# Patient Record
Sex: Male | Born: 1950
Health system: Southern US, Community
[De-identification: ages and names within clinical notes are randomized; demographics above are authoritative.]

## PROBLEM LIST (undated history)

## (undated) DIAGNOSIS — N529 Male erectile dysfunction, unspecified: Secondary | ICD-10-CM

## (undated) DIAGNOSIS — R319 Hematuria, unspecified: Secondary | ICD-10-CM

## (undated) DIAGNOSIS — I639 Cerebral infarction, unspecified: Secondary | ICD-10-CM

## (undated) DIAGNOSIS — I10 Essential (primary) hypertension: Secondary | ICD-10-CM

## (undated) DIAGNOSIS — E785 Hyperlipidemia, unspecified: Secondary | ICD-10-CM

## (undated) HISTORY — DX: Cerebral infarction, unspecified: I63.9

## (undated) HISTORY — DX: Hyperlipidemia, unspecified: E78.5

## (undated) HISTORY — PX: OTHER SURGICAL HISTORY: SHX169

## (undated) HISTORY — DX: Hematuria, unspecified: R31.9

## (undated) HISTORY — DX: Essential (primary) hypertension: I10

## (undated) HISTORY — DX: Male erectile dysfunction, unspecified: N52.9

## (undated) HISTORY — PX: EYE SURGERY: SHX253

---
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
## 2003-04-04 DIAGNOSIS — I639 Cerebral infarction, unspecified: Secondary | ICD-10-CM

## 2003-04-04 HISTORY — DX: Cerebral infarction, unspecified: I63.9

## 2010-06-02 LAB — HM COLONOSCOPY

## 2014-05-24 DIAGNOSIS — Z8673 Personal history of transient ischemic attack (TIA), and cerebral infarction without residual deficits: Secondary | ICD-10-CM | POA: Diagnosis not present

## 2014-05-24 DIAGNOSIS — E785 Hyperlipidemia, unspecified: Secondary | ICD-10-CM | POA: Diagnosis not present

## 2014-05-24 DIAGNOSIS — Z6821 Body mass index (BMI) 21.0-21.9, adult: Secondary | ICD-10-CM | POA: Diagnosis not present

## 2014-05-24 DIAGNOSIS — I10 Essential (primary) hypertension: Secondary | ICD-10-CM | POA: Diagnosis not present

## 2014-05-24 DIAGNOSIS — F17200 Nicotine dependence, unspecified, uncomplicated: Secondary | ICD-10-CM | POA: Diagnosis not present

## 2014-08-12 DIAGNOSIS — Z720 Tobacco use: Secondary | ICD-10-CM | POA: Diagnosis not present

## 2014-08-12 DIAGNOSIS — I10 Essential (primary) hypertension: Secondary | ICD-10-CM | POA: Diagnosis not present

## 2014-08-12 DIAGNOSIS — E785 Hyperlipidemia, unspecified: Secondary | ICD-10-CM | POA: Diagnosis not present

## 2015-01-18 DIAGNOSIS — I10 Essential (primary) hypertension: Secondary | ICD-10-CM

## 2015-01-18 DIAGNOSIS — E785 Hyperlipidemia, unspecified: Secondary | ICD-10-CM | POA: Insufficient documentation

## 2015-01-18 DIAGNOSIS — L259 Unspecified contact dermatitis, unspecified cause: Secondary | ICD-10-CM | POA: Insufficient documentation

## 2015-01-18 DIAGNOSIS — I129 Hypertensive chronic kidney disease with stage 1 through stage 4 chronic kidney disease, or unspecified chronic kidney disease: Secondary | ICD-10-CM | POA: Insufficient documentation

## 2015-01-19 ENCOUNTER — Encounter: Payer: Self-pay | Admitting: Unknown Physician Specialty

## 2015-01-19 ENCOUNTER — Ambulatory Visit (INDEPENDENT_AMBULATORY_CARE_PROVIDER_SITE_OTHER): Payer: Commercial Managed Care - HMO | Admitting: Unknown Physician Specialty

## 2015-01-19 ENCOUNTER — Other Ambulatory Visit: Payer: Self-pay | Admitting: Unknown Physician Specialty

## 2015-01-19 ENCOUNTER — Telehealth: Payer: Self-pay | Admitting: Unknown Physician Specialty

## 2015-01-19 VITALS — BP 146/81 | HR 118 | Temp 97.9°F | Ht 71.4 in | Wt 148.8 lb

## 2015-01-19 DIAGNOSIS — G459 Transient cerebral ischemic attack, unspecified: Secondary | ICD-10-CM | POA: Diagnosis not present

## 2015-01-19 DIAGNOSIS — N183 Chronic kidney disease, stage 3 unspecified: Secondary | ICD-10-CM | POA: Insufficient documentation

## 2015-01-19 DIAGNOSIS — R0989 Other specified symptoms and signs involving the circulatory and respiratory systems: Secondary | ICD-10-CM

## 2015-01-19 DIAGNOSIS — J449 Chronic obstructive pulmonary disease, unspecified: Secondary | ICD-10-CM | POA: Insufficient documentation

## 2015-01-19 DIAGNOSIS — I10 Essential (primary) hypertension: Secondary | ICD-10-CM | POA: Diagnosis not present

## 2015-01-19 DIAGNOSIS — Z720 Tobacco use: Secondary | ICD-10-CM

## 2015-01-19 DIAGNOSIS — N182 Chronic kidney disease, stage 2 (mild): Secondary | ICD-10-CM | POA: Insufficient documentation

## 2015-01-19 DIAGNOSIS — Z23 Encounter for immunization: Secondary | ICD-10-CM | POA: Diagnosis not present

## 2015-01-19 DIAGNOSIS — R0602 Shortness of breath: Secondary | ICD-10-CM

## 2015-01-19 DIAGNOSIS — G4730 Sleep apnea, unspecified: Secondary | ICD-10-CM

## 2015-01-19 DIAGNOSIS — E785 Hyperlipidemia, unspecified: Secondary | ICD-10-CM

## 2015-01-19 LAB — MICROALBUMIN, URINE WAIVED
CREATININE, URINE WAIVED: 200 mg/dL (ref 10–300)
MICROALB, UR WAIVED: 80 mg/L — AB (ref 0–19)

## 2015-01-19 LAB — LIPID PANEL PICCOLO, WAIVED
CHOL/HDL RATIO PICCOLO,WAIVE: 3.2 mg/dL
Cholesterol Piccolo, Waived: 138 mg/dL (ref <200)
HDL Chol Piccolo, Waived: 43 mg/dL — ABNORMAL LOW (ref >59)
LDL Chol Calc Piccolo Waived: 71 mg/dL (ref <100)
Triglycerides Piccolo,Waived: 117 mg/dL (ref <150)
VLDL CHOL CALC PICCOLO,WAIVE: 23 mg/dL (ref <30)

## 2015-01-19 MED ORDER — LISINOPRIL-HYDROCHLOROTHIAZIDE 20-25 MG PO TABS: 1.0000 | ORAL_TABLET | Freq: Every day | ORAL | Status: DC

## 2015-01-19 MED ORDER — ATORVASTATIN CALCIUM 20 MG PO TABS: 20.0000 mg | ORAL_TABLET | Freq: Every day | ORAL | Status: DC

## 2015-01-19 MED ORDER — AMLODIPINE BESYLATE 5 MG PO TABS: 5.0000 mg | ORAL_TABLET | Freq: Every day | ORAL | Status: DC

## 2015-01-19 NOTE — Progress Notes (Addendum)
+-+  BP 146/81 mmHg  Pulse 118  Temp(Src) 97.9 F (36.6 C)  Ht 5' 11.4" (1.814 m)  Wt 148 lb 12.8 oz (67.495 kg)  BMI 20.51 kg/m2  SpO2 97%   Subjective:    Patient ID: Aaron Boyd, male    DOB: 1951/03/31, 64 y.o.   MRN: [id number]  HPI: Aaron Boyd is a 64 y.o. male  Chief Complaint  Patient presents with  . Hyperlipidemia  . Hypertension   Hyperlipidemia This is a chronic problem. The problem is controlled. Recent lipid tests were reviewed and are normal. Associated symptoms include shortness of breath. Pertinent negatives include no chest pain. Current antihyperlipidemic treatment includes statins. The current treatment provides significant improvement of lipids. There are no compliance problems.   Hypertension This is a chronic problem. The problem is uncontrolled. Associated symptoms include blurred vision and shortness of breath. Pertinent negatives include no anxiety, chest pain, headaches, malaise/fatigue, neck pain, orthopnea, palpitations, peripheral edema, PND or sweats. Risk factors for coronary artery disease include smoking/tobacco exposure and dyslipidemia. There are no compliance problems.  Identifiable causes of hypertension include sleep apnea.   Snoring Uses "breath right" nasal strips.  Takes a nap at times and falls asleep in the evening.  States he has no energy much of the time.  He can work for 10-15 minutes and then has to stop due to fatigue and SOB    Relevant past medical, surgical, family and social history reviewed and updated as indicated. Interim medical history since our last visit reviewed. Allergies and medications reviewed and updated.  Family and personal history significant for Hypertension and Hyperlipidemia.  Family history significant for heart disease  Labs from previous chart reveiwed.    Review of Systems  Constitutional: Negative for malaise/fatigue.  Eyes: Positive for blurred vision.  Respiratory: Positive for shortness of  breath.        Continues to smoke 1/2 ppd  Cardiovascular: Negative for chest pain, palpitations, orthopnea and PND.  Musculoskeletal: Negative for neck pain.  Neurological: Negative for headaches.       Pt states that 1-2 weeks ago felt numb on his whole left side for about 10-15 minutes.  Symptoms resolved.    Per HPI unless specifically indicated above     Objective:    BP 146/81 mmHg  Pulse 118  Temp(Src) 97.9 F (36.6 C)  Ht 5' 11.4" (1.814 m)  Wt 148 lb 12.8 oz (67.495 kg)  BMI 20.51 kg/m2  SpO2 97%  Wt Readings from Last 3 Encounters:  01/19/15 148 lb 12.8 oz (67.495 kg)  08/12/14 155 lb (70.308 kg)    Physical Exam  Constitutional: He is oriented to person, place, and time. He appears well-developed and well-nourished. No distress.  HENT:  Head: Normocephalic and atraumatic.  Eyes: Conjunctivae and lids are normal. Right eye exhibits no discharge. Left eye exhibits no discharge. No scleral icterus.  Neck: Decreased carotid pulses present. Carotid bruit is present.  Cardiovascular: Normal rate and regular rhythm.   Pulmonary/Chest: Effort normal. No respiratory distress.  Breath sounds diminished  Abdominal: Normal appearance and bowel sounds are normal. He exhibits no distension. There is no splenomegaly or hepatomegaly. There is no tenderness.  Musculoskeletal: Normal range of motion.  Neurological: He is alert and oriented to person, place, and time.  Skin: Skin is intact. No rash noted. No pallor.  Psychiatric: He has a normal mood and affect. His behavior is normal. Judgment and thought content normal.  Nursing note and  vitals reviewed.  Spirometry shows mild obstruction    Assessment & Plan:   Problem List Items Addressed This Visit      Unprioritized   Hypertension    Not to goal.  Will add Amlodipine 5 mg.        Relevant Medications   atorvastatin (LIPITOR) 20 MG tablet   lisinopril-hydrochlorothiazide (PRINZIDE,ZESTORETIC) 20-25 MG tablet    amLODipine (NORVASC) 5 MG tablet   Other Relevant Orders   Microalbumin, Urine Waived   Uric acid   Comprehensive metabolic panel   Hyperlipidemia    LDL stable at 70.  Continue present meds.        Relevant Medications   atorvastatin (LIPITOR) 20 MG tablet   lisinopril-hydrochlorothiazide (PRINZIDE,ZESTORETIC) 20-25 MG tablet   amLODipine (NORVASC) 5 MG tablet   Other Relevant Orders   Lipid Panel Piccolo, Waived   Tobacco use   COPD, mild (HCC)    Encouraged to quit smoking      CKD (chronic kidney disease) stage 2, GFR 60-89 ml/min    Other Visit Diagnoses    Immunization due    -  Primary    Relevant Orders    Flu Vaccine QUAD 36+ mos PF IM (Fluarix & Fluzone Quad PF) (Completed)    SOB (shortness of breath)        Relevant Orders    Spirometry with Graph (Completed)    Ambulatory referral to Cardiology    CBC    TSH    Transient cerebral ischemia, unspecified transient cerebral ischemia type        Suspect TIA.  Discussed take a daily ASA.  Refer to cardiology for US and carotid US.  Bilateral carotic bruits.  Call 911 further TIA symptoms    Relevant Medications    atorvastatin (LIPITOR) 20 MG tablet    lisinopril-hydrochlorothiazide (PRINZIDE,ZESTORETIC) 20-25 MG tablet    amLODipine (NORVASC) 5 MG tablet    Other Relevant Orders    US Carotid Duplex Bilateral    CBC    MR MRA Head/Brain Wo Cm    Sleep apnea        Refer for sleep study with snoring becoming a problem    Relevant Orders    Ambulatory referral to Sleep Studies    Bilateral carotid bruits        Relevant Orders    US Carotid Duplex Bilateral    MR MRA Head/Brain Wo Cm        Follow up plan: Return in about 4 weeks (around 02/16/2015).   Discussed pt with Dr. Laural BenesJohnson

## 2015-01-19 NOTE — Assessment & Plan Note (Signed)
Not to goal.  Will add Amlodipine 5 mg.

## 2015-01-19 NOTE — Assessment & Plan Note (Signed)
LDL stable at 70.  Continue present meds.

## 2015-01-19 NOTE — Patient Instructions (Addendum)
Watch for appointments with cardiology, carotid US, MRI head Start ASA 325 mg Start Amlodipine 5 mg

## 2015-01-19 NOTE — Telephone Encounter (Signed)
Called to make certain that we had the orders correct for the MRA brain without contrast, spoke with Dr Mosetta PuttGrady, radiologist and he stated that MRA brain without contrast with solely look at the arteries within the brain, however in addition you could order a MRI brain without contrast to view the entire brain.   However even though you are ordering these exams insurance will ultimately have the final decision on what they will cover they may suggest a different study. I am nto certain until I get the authorization. I will keep you posted on which happens for approval.

## 2015-01-19 NOTE — Assessment & Plan Note (Signed)
Encouraged to quit smoking.  

## 2015-01-20 ENCOUNTER — Other Ambulatory Visit: Payer: Self-pay | Admitting: Unknown Physician Specialty

## 2015-01-20 DIAGNOSIS — D649 Anemia, unspecified: Secondary | ICD-10-CM

## 2015-01-20 DIAGNOSIS — G4730 Sleep apnea, unspecified: Secondary | ICD-10-CM

## 2015-01-20 LAB — COMPREHENSIVE METABOLIC PANEL
A/G RATIO: 1.4 (ref 1.1–2.5)
ALT: 13 IU/L (ref 0–44)
AST: 15 IU/L (ref 0–40)
Albumin: 3.9 g/dL (ref 3.6–4.8)
Alkaline Phosphatase: 81 IU/L (ref 39–117)
BUN/Creatinine Ratio: 17 (ref 10–22)
BUN: 20 mg/dL (ref 8–27)
Bilirubin Total: 0.6 mg/dL (ref 0.0–1.2)
CALCIUM: 8.9 mg/dL (ref 8.6–10.2)
CO2: 23 mmol/L (ref 18–29)
Chloride: 99 mmol/L (ref 97–106)
Creatinine, Ser: 1.17 mg/dL (ref 0.76–1.27)
GFR, EST AFRICAN AMERICAN: 76 mL/min/{1.73_m2} (ref >59)
GFR, EST NON AFRICAN AMERICAN: 66 mL/min/{1.73_m2} (ref >59)
GLUCOSE: 88 mg/dL (ref 65–99)
Globulin, Total: 2.7 g/dL (ref 1.5–4.5)
POTASSIUM: 3.8 mmol/L (ref 3.5–5.2)
SODIUM: 136 mmol/L (ref 136–144)
Total Protein: 6.6 g/dL (ref 6.0–8.5)

## 2015-01-20 LAB — CBC
Hematocrit: 33.7 % — ABNORMAL LOW (ref 37.5–51.0)
Hemoglobin: 11.4 g/dL — ABNORMAL LOW (ref 12.6–17.7)
MCH: 29.5 pg (ref 26.6–33.0)
MCHC: 33.8 g/dL (ref 31.5–35.7)
MCV: 87 fL (ref 79–97)
PLATELETS: 221 10*3/uL (ref 150–379)
RBC: 3.86 x10E6/uL — ABNORMAL LOW (ref 4.14–5.80)
RDW: 13.8 % (ref 12.3–15.4)
WBC: 9.6 10*3/uL (ref 3.4–10.8)

## 2015-01-20 LAB — TSH: TSH: 1.48 u[IU]/mL (ref 0.450–4.500)

## 2015-01-20 LAB — URIC ACID: URIC ACID: 6.5 mg/dL (ref 3.7–8.6)

## 2015-01-21 ENCOUNTER — Other Ambulatory Visit: Payer: Commercial Managed Care - HMO

## 2015-01-21 DIAGNOSIS — D649 Anemia, unspecified: Secondary | ICD-10-CM | POA: Diagnosis not present

## 2015-01-22 LAB — FERRITIN: Ferritin: 234 ng/mL (ref 30–400)

## 2015-01-22 LAB — IRON AND TIBC
Iron Saturation: 16 % (ref 15–55)
Iron: 39 ug/dL (ref 38–169)
TIBC: 248 ug/dL — AB (ref 250–450)
UIBC: 209 ug/dL (ref 111–343)

## 2015-01-22 LAB — FOLATE: Folate: 4.7 ng/mL (ref >3.0)

## 2015-01-22 LAB — VITAMIN B12: Vitamin B-12: 380 pg/mL (ref 211–946)

## 2015-01-27 ENCOUNTER — Telehealth: Payer: Self-pay | Admitting: Unknown Physician Specialty

## 2015-01-27 NOTE — Telephone Encounter (Signed)
Feeling Aaron Boyd has notified the pt to scheduled for study that you have requested and pt has refused the study. Scanned documents under media inside the chart.

## 2015-02-17 ENCOUNTER — Ambulatory Visit: Payer: Commercial Managed Care - HMO | Admitting: Unknown Physician Specialty

## 2015-03-08 ENCOUNTER — Telehealth: Payer: Self-pay | Admitting: Unknown Physician Specialty

## 2015-03-08 ENCOUNTER — Ambulatory Visit
Admission: RE | Admit: 2015-03-08 | Discharge: 2015-03-08 | Disposition: A | Discharge status: Home / Self Care | Payer: Commercial Managed Care - HMO | Source: Ambulatory Visit | Attending: Unknown Physician Specialty | Admitting: Unknown Physician Specialty

## 2015-03-08 ENCOUNTER — Other Ambulatory Visit: Payer: Self-pay | Admitting: Unknown Physician Specialty

## 2015-03-08 DIAGNOSIS — R0989 Other specified symptoms and signs involving the circulatory and respiratory systems: Secondary | ICD-10-CM | POA: Diagnosis not present

## 2015-03-08 DIAGNOSIS — M4802 Spinal stenosis, cervical region: Secondary | ICD-10-CM | POA: Insufficient documentation

## 2015-03-08 DIAGNOSIS — I6523 Occlusion and stenosis of bilateral carotid arteries: Secondary | ICD-10-CM | POA: Diagnosis not present

## 2015-03-08 DIAGNOSIS — G459 Transient cerebral ischemic attack, unspecified: Secondary | ICD-10-CM

## 2015-03-08 DIAGNOSIS — I6502 Occlusion and stenosis of left vertebral artery: Secondary | ICD-10-CM | POA: Diagnosis not present

## 2015-03-08 DIAGNOSIS — R42 Dizziness and giddiness: Secondary | ICD-10-CM | POA: Diagnosis not present

## 2015-03-08 DIAGNOSIS — I6522 Occlusion and stenosis of left carotid artery: Secondary | ICD-10-CM

## 2015-03-08 NOTE — Telephone Encounter (Signed)
Called patient to discuss Carotid artery stenosis.  I referred the patient ASAP to vascular.  Pt states he can't afford to go.  I emphasized that this was an important referral and I recommend both cardiology and vascular surgery.  I suspect vascular will need a cardiology evaluation.

## 2015-03-09 DIAGNOSIS — I10 Essential (primary) hypertension: Secondary | ICD-10-CM | POA: Diagnosis not present

## 2015-03-09 DIAGNOSIS — R0602 Shortness of breath: Secondary | ICD-10-CM | POA: Diagnosis not present

## 2015-03-09 DIAGNOSIS — R0789 Other chest pain: Secondary | ICD-10-CM | POA: Diagnosis not present

## 2015-03-09 DIAGNOSIS — Z720 Tobacco use: Secondary | ICD-10-CM | POA: Diagnosis not present

## 2015-03-09 DIAGNOSIS — J449 Chronic obstructive pulmonary disease, unspecified: Secondary | ICD-10-CM | POA: Diagnosis not present

## 2015-03-09 DIAGNOSIS — I779 Disorder of arteries and arterioles, unspecified: Secondary | ICD-10-CM | POA: Insufficient documentation

## 2015-03-09 DIAGNOSIS — E7801 Familial hypercholesterolemia: Secondary | ICD-10-CM | POA: Diagnosis not present

## 2015-03-09 DIAGNOSIS — N182 Chronic kidney disease, stage 2 (mild): Secondary | ICD-10-CM | POA: Diagnosis not present

## 2015-03-23 DIAGNOSIS — R0602 Shortness of breath: Secondary | ICD-10-CM | POA: Diagnosis not present

## 2015-03-23 DIAGNOSIS — R0789 Other chest pain: Secondary | ICD-10-CM | POA: Diagnosis not present

## 2015-04-07 DIAGNOSIS — N182 Chronic kidney disease, stage 2 (mild): Secondary | ICD-10-CM | POA: Diagnosis not present

## 2015-04-07 DIAGNOSIS — E7801 Familial hypercholesterolemia: Secondary | ICD-10-CM | POA: Diagnosis not present

## 2015-04-07 DIAGNOSIS — J449 Chronic obstructive pulmonary disease, unspecified: Secondary | ICD-10-CM | POA: Diagnosis not present

## 2015-04-07 DIAGNOSIS — I10 Essential (primary) hypertension: Secondary | ICD-10-CM | POA: Diagnosis not present

## 2015-04-07 DIAGNOSIS — Z720 Tobacco use: Secondary | ICD-10-CM | POA: Diagnosis not present

## 2015-04-07 DIAGNOSIS — I779 Disorder of arteries and arterioles, unspecified: Secondary | ICD-10-CM | POA: Diagnosis not present

## 2015-04-09 DIAGNOSIS — E785 Hyperlipidemia, unspecified: Secondary | ICD-10-CM | POA: Diagnosis not present

## 2015-04-09 DIAGNOSIS — I6523 Occlusion and stenosis of bilateral carotid arteries: Secondary | ICD-10-CM | POA: Diagnosis not present

## 2015-04-09 DIAGNOSIS — I10 Essential (primary) hypertension: Secondary | ICD-10-CM | POA: Diagnosis not present

## 2015-04-09 DIAGNOSIS — F17200 Nicotine dependence, unspecified, uncomplicated: Secondary | ICD-10-CM | POA: Diagnosis not present

## 2015-05-21 ENCOUNTER — Ambulatory Visit (INDEPENDENT_AMBULATORY_CARE_PROVIDER_SITE_OTHER): Payer: Commercial Managed Care - HMO | Admitting: Unknown Physician Specialty

## 2015-05-21 ENCOUNTER — Encounter: Payer: Self-pay | Admitting: Unknown Physician Specialty

## 2015-05-21 VITALS — BP 100/64 | HR 111 | Temp 97.9°F | Ht 71.7 in | Wt 142.6 lb

## 2015-05-21 DIAGNOSIS — J209 Acute bronchitis, unspecified: Secondary | ICD-10-CM | POA: Diagnosis not present

## 2015-05-21 DIAGNOSIS — J42 Unspecified chronic bronchitis: Secondary | ICD-10-CM | POA: Diagnosis not present

## 2015-05-21 MED ORDER — DOXYCYCLINE HYCLATE 100 MG PO TABS: 100.0000 mg | ORAL_TABLET | Freq: Two times a day (BID) | ORAL | Status: DC

## 2015-05-21 NOTE — Progress Notes (Signed)
BP 100/64 mmHg  Pulse 111  Temp(Src) 97.9 F (36.6 C)  Ht 5' 11.7" (1.821 m)  Wt 142 lb 9.6 oz (64.683 kg)  BMI 19.51 kg/m2  SpO2 97%   Subjective:    Patient ID: Aaron Boyd, male    DOB: 12-Dec-1950, 65 y.o.   MRN: [id number]  HPI: Aaron Boyd is a 65 y.o. male  Chief Complaint  Patient presents with  . Cough    pt states he has had a cough from about 10 days now. States he is coughing up yellow phlegm sometimes and states some days are worse than others   Cough This is a new problem. Episode onset: 8 days. The problem has been gradually improving. The cough is productive of purulent sputum. Associated symptoms include myalgias and weight loss. Pertinent negatives include no chest pain, chills, ear congestion, ear pain, fever, headaches, postnasal drip, rash, sore throat or shortness of breath. Exacerbated by: not sure. Risk factors for lung disease include smoking/tobacco exposure. He has tried OTC cough suppressant for the symptoms. The treatment provided mild relief. His past medical history is significant for COPD.     Relevant past medical, surgical, family and social history reviewed and updated as indicated. Interim medical history since our last visit reviewed. Allergies and medications reviewed and updated.  Review of Systems  Constitutional: Positive for weight loss. Negative for fever and chills.  HENT: Negative for ear pain, postnasal drip and sore throat.   Respiratory: Positive for cough. Negative for shortness of breath.   Cardiovascular: Negative for chest pain.  Musculoskeletal: Positive for myalgias.  Skin: Negative for rash.  Neurological: Negative for headaches.    Per HPI unless specifically indicated above     Objective:    BP 100/64 mmHg  Pulse 111  Temp(Src) 97.9 F (36.6 C)  Ht 5' 11.7" (1.821 m)  Wt 142 lb 9.6 oz (64.683 kg)  BMI 19.51 kg/m2  SpO2 97%  Wt Readings from Last 3 Encounters:  05/21/15 142 lb 9.6 oz (64.683 kg)   01/19/15 148 lb 12.8 oz (67.495 kg)  08/12/14 155 lb (70.308 kg)    Physical Exam  Constitutional: He is oriented to person, place, and time. He appears well-developed and well-nourished. No distress.  HENT:  Head: Normocephalic and atraumatic.  Right Ear: Tympanic membrane and ear canal normal.  Left Ear: Tympanic membrane and ear canal normal.  Nose: Rhinorrhea present. Right sinus exhibits no maxillary sinus tenderness and no frontal sinus tenderness. Left sinus exhibits no maxillary sinus tenderness and no frontal sinus tenderness.  Mouth/Throat: Uvula is midline. Posterior oropharyngeal edema present.  Eyes: Conjunctivae and lids are normal. Right eye exhibits no discharge. Left eye exhibits no discharge. No scleral icterus.  Neck: Neck supple.  Cardiovascular: Normal rate, regular rhythm and normal heart sounds.   Pulmonary/Chest: Effort normal and breath sounds normal. No respiratory distress.  Abdominal: Normal appearance. There is no splenomegaly or hepatomegaly.  Musculoskeletal: Normal range of motion.  Neurological: He is alert and oriented to person, place, and time.  Skin: Skin is warm, dry and intact. No rash noted. No pallor.  Psychiatric: He has a normal mood and affect. His behavior is normal. Judgment and thought content normal.  Nursing note and vitals reviewed.   Results for orders placed or performed in visit on 01/21/15  Ferritin  Result Value Ref Range   Ferritin 234 30 - 400 ng/mL  Iron and TIBC  Result Value Ref Range   Total  Iron Binding Capacity 248 (L) 250 - 450 ug/dL   UIBC 403 474 - 259 ug/dL   Iron 39 38 - 563 ug/dL   Iron Saturation 16 15 - 55 %  Folate  Result Value Ref Range   Folate 4.7 >3.0 ng/mL  Vitamin B12  Result Value Ref Range   Vitamin B-12 380 211 - 946 pg/mL      Assessment & Plan:   Problem List Items Addressed This Visit    None    Visit Diagnoses    Acute exacerbation of chronic bronchitis (HCC)    -  Primary         Follow up plan: Return if symptoms worsen or fail to improve.

## 2015-06-21 ENCOUNTER — Ambulatory Visit: Payer: Commercial Managed Care - HMO | Admitting: Unknown Physician Specialty

## 2015-06-22 ENCOUNTER — Encounter: Payer: Self-pay | Admitting: Unknown Physician Specialty

## 2015-07-20 ENCOUNTER — Other Ambulatory Visit: Payer: Self-pay | Admitting: Unknown Physician Specialty

## 2015-09-10 ENCOUNTER — Other Ambulatory Visit: Payer: Self-pay | Admitting: Unknown Physician Specialty

## 2015-09-23 ENCOUNTER — Other Ambulatory Visit: Payer: Self-pay | Admitting: Unknown Physician Specialty

## 2015-09-28 ENCOUNTER — Telehealth: Payer: Self-pay | Admitting: Unknown Physician Specialty

## 2015-09-28 NOTE — Telephone Encounter (Signed)
Pts wife called and stated the pt was stung by a red wasp yesterday and she would like advise on how to protect it from infection.

## 2015-09-28 NOTE — Telephone Encounter (Signed)
Pt's wife called to follow-up on bee sting.  I read the provider recommendations to pts wife and told her if it worsens to call us back.

## 2015-09-28 NOTE — Telephone Encounter (Signed)
Just wash with soap and water.  OTC hydrocortisone cream may help with swelling

## 2015-09-28 NOTE — Telephone Encounter (Signed)
Routing to provider for advice.

## 2015-11-05 ENCOUNTER — Encounter: Payer: Self-pay | Admitting: Unknown Physician Specialty

## 2015-11-05 ENCOUNTER — Ambulatory Visit
Admission: RE | Admit: 2015-11-05 | Discharge: 2015-11-05 | Disposition: A | Discharge status: Home / Self Care | Payer: Commercial Managed Care - HMO | Source: Ambulatory Visit | Attending: Unknown Physician Specialty | Admitting: Unknown Physician Specialty

## 2015-11-05 ENCOUNTER — Ambulatory Visit (INDEPENDENT_AMBULATORY_CARE_PROVIDER_SITE_OTHER): Payer: Commercial Managed Care - HMO | Admitting: Unknown Physician Specialty

## 2015-11-05 VITALS — BP 144/71 | HR 71 | Temp 98.6°F | Ht 73.5 in | Wt 134.8 lb

## 2015-11-05 DIAGNOSIS — Z8673 Personal history of transient ischemic attack (TIA), and cerebral infarction without residual deficits: Secondary | ICD-10-CM | POA: Insufficient documentation

## 2015-11-05 DIAGNOSIS — R42 Dizziness and giddiness: Secondary | ICD-10-CM

## 2015-11-05 DIAGNOSIS — I6523 Occlusion and stenosis of bilateral carotid arteries: Secondary | ICD-10-CM | POA: Diagnosis not present

## 2015-11-05 DIAGNOSIS — R5383 Other fatigue: Secondary | ICD-10-CM | POA: Insufficient documentation

## 2015-11-05 DIAGNOSIS — G459 Transient cerebral ischemic attack, unspecified: Secondary | ICD-10-CM

## 2015-11-05 DIAGNOSIS — R0602 Shortness of breath: Secondary | ICD-10-CM | POA: Diagnosis not present

## 2015-11-05 DIAGNOSIS — R413 Other amnesia: Secondary | ICD-10-CM | POA: Diagnosis not present

## 2015-11-05 DIAGNOSIS — I739 Peripheral vascular disease, unspecified: Secondary | ICD-10-CM

## 2015-11-05 DIAGNOSIS — D509 Iron deficiency anemia, unspecified: Secondary | ICD-10-CM | POA: Diagnosis not present

## 2015-11-05 MED ORDER — LISINOPRIL-HYDROCHLOROTHIAZIDE 20-25 MG PO TABS: 1.0000 | ORAL_TABLET | Freq: Every day | ORAL | 1 refills | Status: DC

## 2015-11-05 MED ORDER — ATORVASTATIN CALCIUM 20 MG PO TABS: 20.0000 mg | ORAL_TABLET | Freq: Every day | ORAL | 1 refills | Status: DC

## 2015-11-05 MED ORDER — AMLODIPINE BESYLATE 5 MG PO TABS: 5.0000 mg | ORAL_TABLET | Freq: Every day | ORAL | 1 refills | Status: DC

## 2015-11-05 NOTE — Assessment & Plan Note (Addendum)
Takes an ASA/day.  He is seeing vascular surgery for his carotid stenosis but I cannot find those records.  Get records from vein and vascular

## 2015-11-05 NOTE — Assessment & Plan Note (Addendum)
Refer to Neurology for further evaluation.

## 2015-11-05 NOTE — Progress Notes (Signed)
BP (!) 144/71 (BP Location: Left Arm, Patient Position: Sitting, Cuff Size: Normal)   Pulse 71   Temp 98.6 F (37 C)   Ht 6' 1.5" (1.867 m)   Wt 134 lb 12.8 oz (61.1 kg)   SpO2 100%   BMI 17.54 kg/m    Subjective:    Patient ID: Aaron Boyd, male    DOB: 1950/08/17, 65 y.o.   MRN: [id number]  HPI: Aaron Boyd is a 65 y.o. male  Chief Complaint  Patient presents with  . bad feeling    pt states this past Sunday he was driving to his son's horse barn and while driving, he got very clammy and a little dizzy. States it was a sudden onset, lasted about an hour, and promised his family he would get checked out  . Shoulder Pain    pt states the back of his left shoulder has been bothering him  . Medication Refill    pt states he needs 90 day supplies of all medications sent to [facility]   Hypertension Using medications without difficulty Average home BPs Not checking   No problems or lightheadedness No chest pain with exertion or shortness of breath No Edema   Hyperlipidemia Using medications without problems: No Muscle aches  \ Dizzy States his dizzyness lasted one hour.  Of note he had a cardiac work-up done a coupld of months ago which was normal His wife says he is tired, forgetful at times and losing weight.  I reviewed his notes and vascular surgeon felt surgery was not appropriated at this time.  He does have vascular changes on MRA of head.  Wife says it seems like he has mini strokes.  Takes an ASA/day.  He is seeing vascular surgery for his carotid stenosis but I cannot find those records.  Following every 6 months with [place] [facility]   Relevant past medical, surgical, family and social history reviewed and updated as indicated. Interim medical history since our last visit reviewed. Allergies and medications reviewed and updated.  Review of Systems  Per HPI unless specifically indicated above     Objective:    BP (!) 144/71 (BP Location:  Left Arm, Patient Position: Sitting, Cuff Size: Normal)   Pulse 71   Temp 98.6 F (37 C)   Ht 6' 1.5" (1.867 m)   Wt 134 lb 12.8 oz (61.1 kg)   SpO2 100%   BMI 17.54 kg/m   Wt Readings from Last 3 Encounters:  11/05/15 134 lb 12.8 oz (61.1 kg)  05/21/15 142 lb 9.6 oz (64.7 kg)  01/19/15 148 lb 12.8 oz (67.5 kg)    Physical Exam  Constitutional: He is oriented to person, place, and time. He appears well-developed and well-nourished. No distress.  HENT:  Head: Normocephalic and atraumatic.  Eyes: Conjunctivae and lids are normal. Right eye exhibits no discharge. Left eye exhibits no discharge. No scleral icterus.  Neck: Normal range of motion. Neck supple. No JVD present. Carotid bruit is present.  Cardiovascular: Normal rate, regular rhythm and normal heart sounds.   Pulmonary/Chest: No respiratory distress. He has decreased breath sounds.  Abdominal: Normal appearance. There is no splenomegaly or hepatomegaly.  Musculoskeletal: Normal range of motion.  Neurological: He is alert and oriented to person, place, and time.  Skin: Skin is warm, dry and intact. No rash noted. No pallor.  Psychiatric: He has a normal mood and affect. His behavior is normal. Judgment and thought content normal.  Results for orders placed or performed in visit on 01/21/15  Ferritin  Result Value Ref Range   Ferritin 234 30 - 400 ng/mL  Iron and TIBC  Result Value Ref Range   Total Iron Binding Capacity 248 (L) 250 - 450 ug/dL   UIBC 161 096 - 045 ug/dL   Iron 39 38 - 409 ug/dL   Iron Saturation 16 15 - 55 %  Folate  Result Value Ref Range   Folate 4.7 >3.0 ng/mL  Vitamin B12  Result Value Ref Range   Vitamin B-12 380 211 - 946 pg/mL      Assessment & Plan:   Problem List Items Addressed This Visit      Unprioritized   Carotid stenosis    Takes an ASA/day.  He is seeing vascular surgery for his carotid stenosis but I cannot find those records.  Get records from vein and vascular       Relevant Medications   lisinopril-hydrochlorothiazide (PRINZIDE,ZESTORETIC) 20-25 MG tablet   atorvastatin (LIPITOR) 20 MG tablet   amLODipine (NORVASC) 5 MG tablet   Other Relevant Orders   Ambulatory referral to Neurology   Memory loss    Refer to neurology      TIA (transient ischemic attack)    Refer to Neurology for further evaluation.        Relevant Medications   lisinopril-hydrochlorothiazide (PRINZIDE,ZESTORETIC) 20-25 MG tablet   atorvastatin (LIPITOR) 20 MG tablet   amLODipine (NORVASC) 5 MG tablet    Other Visit Diagnoses    SOB (shortness of breath)    -  Primary   Other fatigue       Relevant Orders   Comprehensive metabolic panel   CBC with Differential/Platelet   TSH   DG Chest 2 View   Dizzy spells       Relevant Orders   Ambulatory referral to Neurology       Follow up plan: Return in about 3 months (around 02/05/2016).

## 2015-11-05 NOTE — Assessment & Plan Note (Signed)
Refer to neurology

## 2015-11-06 LAB — CBC WITH DIFFERENTIAL/PLATELET
Basophils Absolute: 0.1 10*3/uL (ref 0.0–0.2)
Basos: 1 %
EOS (ABSOLUTE): 0.2 10*3/uL (ref 0.0–0.4)
EOS: 2 %
HEMATOCRIT: 33.2 % — AB (ref 37.5–51.0)
HEMOGLOBIN: 10.6 g/dL — AB (ref 12.6–17.7)
IMMATURE GRANS (ABS): 0 10*3/uL (ref 0.0–0.1)
Immature Granulocytes: 0 %
LYMPHS ABS: 3 10*3/uL (ref 0.7–3.1)
LYMPHS: 34 %
MCH: 28.3 pg (ref 26.6–33.0)
MCHC: 31.9 g/dL (ref 31.5–35.7)
MCV: 89 fL (ref 79–97)
MONOCYTES: 8 %
Monocytes Absolute: 0.7 10*3/uL (ref 0.1–0.9)
NEUTROS ABS: 4.8 10*3/uL (ref 1.4–7.0)
Neutrophils: 55 %
Platelets: 231 10*3/uL (ref 150–379)
RBC: 3.74 x10E6/uL — AB (ref 4.14–5.80)
RDW: 14.7 % (ref 12.3–15.4)
WBC: 8.8 10*3/uL (ref 3.4–10.8)

## 2015-11-06 LAB — COMPREHENSIVE METABOLIC PANEL
ALBUMIN: 4 g/dL (ref 3.6–4.8)
ALT: 16 IU/L (ref 0–44)
AST: 21 IU/L (ref 0–40)
Albumin/Globulin Ratio: 1.3 (ref 1.2–2.2)
Alkaline Phosphatase: 89 IU/L (ref 39–117)
BUN / CREAT RATIO: 21 (ref 10–24)
BUN: 27 mg/dL (ref 8–27)
Bilirubin Total: 0.3 mg/dL (ref 0.0–1.2)
CALCIUM: 9.5 mg/dL (ref 8.6–10.2)
CO2: 26 mmol/L (ref 18–29)
CREATININE: 1.27 mg/dL (ref 0.76–1.27)
Chloride: 97 mmol/L (ref 96–106)
GFR calc non Af Amer: 59 mL/min/{1.73_m2} — ABNORMAL LOW (ref >59)
GFR, EST AFRICAN AMERICAN: 69 mL/min/{1.73_m2} (ref >59)
GLUCOSE: 87 mg/dL (ref 65–99)
Globulin, Total: 3.1 g/dL (ref 1.5–4.5)
Potassium: 4.7 mmol/L (ref 3.5–5.2)
Sodium: 137 mmol/L (ref 134–144)
TOTAL PROTEIN: 7.1 g/dL (ref 6.0–8.5)

## 2015-11-06 LAB — TSH: TSH: 1.32 u[IU]/mL (ref 0.450–4.500)

## 2015-11-08 ENCOUNTER — Telehealth: Payer: Self-pay | Admitting: Family Medicine

## 2015-11-08 NOTE — Telephone Encounter (Signed)
Called and let patient know what Dr. Johnson said.  

## 2015-11-08 NOTE — Telephone Encounter (Signed)
We'll let him know as soon as the other labs come back. Nothing to worry about right this second. They should be back by tomorrow.

## 2015-11-08 NOTE — Telephone Encounter (Signed)
Called and let patient know about everything Dr. Laural BenesJohnson said. Patient wants to know what needs to be done or what he should be doing as far as the low blood count goes.

## 2015-11-08 NOTE — Telephone Encounter (Signed)
Please let Aaron MaduroRobert know that his blood count came back low. The rest of his labs were normal. We are able to add the tests from the blood they drew on Friday, so we'll let him know the results when it comes back. Thanks!

## 2015-11-15 LAB — IRON AND TIBC
Iron Saturation: 20 % (ref 15–55)
Iron: 50 ug/dL (ref 38–169)
Total Iron Binding Capacity: 254 ug/dL (ref 250–450)
UIBC: 204 ug/dL (ref 111–343)

## 2015-11-15 LAB — FERRITIN: FERRITIN: 215 ng/mL (ref 30–400)

## 2015-11-15 LAB — SPECIMEN STATUS REPORT

## 2016-01-07 ENCOUNTER — Ambulatory Visit (INDEPENDENT_AMBULATORY_CARE_PROVIDER_SITE_OTHER): Payer: Commercial Managed Care - HMO

## 2016-01-07 DIAGNOSIS — Z23 Encounter for immunization: Secondary | ICD-10-CM | POA: Diagnosis not present

## 2016-02-09 ENCOUNTER — Encounter: Payer: Self-pay | Admitting: Unknown Physician Specialty

## 2016-02-09 ENCOUNTER — Ambulatory Visit (INDEPENDENT_AMBULATORY_CARE_PROVIDER_SITE_OTHER): Payer: Commercial Managed Care - HMO | Admitting: Unknown Physician Specialty

## 2016-02-09 VITALS — BP 123/70 | HR 92 | Temp 98.2°F | Ht 73.2 in | Wt 139.2 lb

## 2016-02-09 DIAGNOSIS — I10 Essential (primary) hypertension: Secondary | ICD-10-CM

## 2016-02-09 DIAGNOSIS — Z720 Tobacco use: Secondary | ICD-10-CM

## 2016-02-09 DIAGNOSIS — D649 Anemia, unspecified: Secondary | ICD-10-CM

## 2016-02-09 DIAGNOSIS — E782 Mixed hyperlipidemia: Secondary | ICD-10-CM

## 2016-02-09 LAB — UA/M W/RFLX CULTURE, ROUTINE
Bilirubin, UA: NEGATIVE
GLUCOSE, UA: NEGATIVE
KETONES UA: NEGATIVE
LEUKOCYTES UA: NEGATIVE
NITRITE UA: NEGATIVE
PROTEIN UA: NEGATIVE
SPEC GRAV UA: 1.01 (ref 1.005–1.030)
Urobilinogen, Ur: 0.2 mg/dL (ref 0.2–1.0)
pH, UA: 5.5 (ref 5.0–7.5)

## 2016-02-09 LAB — CBC WITH DIFFERENTIAL/PLATELET
Hematocrit: 34.5 % — ABNORMAL LOW (ref 37.5–51.0)
Hemoglobin: 11.4 g/dL — ABNORMAL LOW (ref 12.6–17.7)
LYMPHS: 29 %
Lymphocytes Absolute: 2.7 10*3/uL (ref 0.7–3.1)
MCH: 30.2 pg (ref 26.6–33.0)
MCHC: 33 g/dL (ref 31.5–35.7)
MCV: 91 fL (ref 79–97)
MID (Absolute): 1 10*3/uL (ref 0.1–1.6)
MID: 11 %
NEUTROS ABS: 5.6 10*3/uL (ref 1.4–7.0)
Neutrophils: 60 %
PLATELETS: 210 10*3/uL (ref 150–379)
RBC: 3.78 x10E6/uL — AB (ref 4.14–5.80)
RDW: 13.9 % (ref 12.3–15.4)
WBC: 9.3 10*3/uL (ref 3.4–10.8)

## 2016-02-09 LAB — MICROSCOPIC EXAMINATION
Epithelial Cells (non renal): NONE SEEN /hpf (ref 0–10)
WBC, UA: NONE SEEN /hpf (ref 0–?)

## 2016-02-09 MED ORDER — VARENICLINE TARTRATE 0.5 MG PO TABS
0.5000 mg | ORAL_TABLET | Freq: Two times a day (BID) | ORAL | 0 refills | Status: DC
Start: 2016-02-09 — End: 2016-06-04

## 2016-02-09 MED ORDER — VARENICLINE TARTRATE 1 MG PO TABS
1.0000 mg | ORAL_TABLET | Freq: Two times a day (BID) | ORAL | 5 refills | Status: DC
Start: 2016-02-09 — End: 2016-08-08

## 2016-02-09 NOTE — Assessment & Plan Note (Signed)
Stable, continue present medications.   

## 2016-02-09 NOTE — Assessment & Plan Note (Signed)
Chantix rx given

## 2016-02-09 NOTE — Progress Notes (Signed)
BP 123/70 (BP Location: Left Arm, Patient Position: Sitting, Cuff Size: Small)   Pulse 92   Temp 98.2 F (36.8 C)   Ht 6' 1.2" (1.859 m)   Wt 139 lb 3.2 oz (63.1 kg)   SpO2 96%   BMI 18.27 kg/m    Subjective:    Patient ID: Aaron Boyd, male    DOB: 02-03-1951, 65 y.o.   MRN: [id number]  HPI: Aaron LaineRobert D Kuzel is a 65 y.o. male  Chief Complaint  Patient presents with  . Hyperlipidemia  . Hypertension   Hypertension Using medications without difficulty Average home BPs Not checking   No problems or lightheadedness No chest pain with exertion or shortness of breath No Edema  Hyperlipidemia Using medications without problems: No Muscle aches  Diet compliance: Exercise:  Anemia Noted last visit.  Normal Ferritin.  Needs to recheck.  Suspect anemia of chronic disease.    Relevant past medical, surgical, family and social history reviewed and updated as indicated. Interim medical history since our last visit reviewed. Allergies and medications reviewed and updated.  Review of Systems  Per HPI unless specifically indicated above     Objective:    BP 123/70 (BP Location: Left Arm, Patient Position: Sitting, Cuff Size: Small)   Pulse 92   Temp 98.2 F (36.8 C)   Ht 6' 1.2" (1.859 m)   Wt 139 lb 3.2 oz (63.1 kg)   SpO2 96%   BMI 18.27 kg/m   Wt Readings from Last 3 Encounters:  02/09/16 139 lb 3.2 oz (63.1 kg)  11/05/15 134 lb 12.8 oz (61.1 kg)  05/21/15 142 lb 9.6 oz (64.7 kg)    Physical Exam  Constitutional: He is oriented to person, place, and time. He appears well-developed and well-nourished. No distress.  HENT:  Head: Normocephalic and atraumatic.  Eyes: Conjunctivae and lids are normal. Right eye exhibits no discharge. Left eye exhibits no discharge. No scleral icterus.  Neck: Normal range of motion. Neck supple. No JVD present. Carotid bruit is not present.  Cardiovascular: Normal rate, regular rhythm and normal heart sounds.     Pulmonary/Chest: Effort normal and breath sounds normal. No respiratory distress.  Abdominal: Normal appearance. There is no splenomegaly or hepatomegaly.  Musculoskeletal: Normal range of motion.  Neurological: He is alert and oriented to person, place, and time.  Skin: Skin is warm, dry and intact. No rash noted. No pallor.  Psychiatric: He has a normal mood and affect. His behavior is normal. Judgment and thought content normal.    Results for orders placed or performed in visit on 11/05/15  Comprehensive metabolic panel  Result Value Ref Range   Glucose 87 65 - 99 mg/dL   BUN 27 8 - 27 mg/dL   Creatinine, Ser 7.821.27 0.76 - 1.27 mg/dL   GFR calc non Af Amer 59 (L) >59 mL/min/1.73   GFR calc Af Amer 69 >59 mL/min/1.73   BUN/Creatinine Ratio 21 10 - 24   Sodium 137 134 - 144 mmol/L   Potassium 4.7 3.5 - 5.2 mmol/L   Chloride 97 96 - 106 mmol/L   CO2 26 18 - 29 mmol/L   Calcium 9.5 8.6 - 10.2 mg/dL   Total Protein 7.1 6.0 - 8.5 g/dL   Albumin 4.0 3.6 - 4.8 g/dL   Globulin, Total 3.1 1.5 - 4.5 g/dL   Albumin/Globulin Ratio 1.3 1.2 - 2.2   Bilirubin Total 0.3 0.0 - 1.2 mg/dL   Alkaline Phosphatase 89 39 - 117 IU/L  AST 21 0 - 40 IU/L   ALT 16 0 - 44 IU/L  CBC with Differential/Platelet  Result Value Ref Range   WBC 8.8 3.4 - 10.8 x10E3/uL   RBC 3.74 (L) 4.14 - 5.80 x10E6/uL   Hemoglobin 10.6 (L) 12.6 - 17.7 g/dL   Hematocrit 50.033.2 (L) 93.837.5 - 51.0 %   MCV 89 79 - 97 fL   MCH 28.3 26.6 - 33.0 pg   MCHC 31.9 31.5 - 35.7 g/dL   RDW 18.214.7 99.312.3 - 71.615.4 %   Platelets 231 150 - 379 x10E3/uL   Neutrophils 55 %   Lymphs 34 %   Monocytes 8 %   Eos 2 %   Basos 1 %   Neutrophils Absolute 4.8 1.4 - 7.0 x10E3/uL   Lymphocytes Absolute 3.0 0.7 - 3.1 x10E3/uL   Monocytes Absolute 0.7 0.1 - 0.9 x10E3/uL   EOS (ABSOLUTE) 0.2 0.0 - 0.4 x10E3/uL   Basophils Absolute 0.1 0.0 - 0.2 x10E3/uL   Immature Granulocytes 0 %   Immature Grans (Abs) 0.0 0.0 - 0.1 x10E3/uL  TSH  Result Value Ref  Range   TSH 1.320 0.450 - 4.500 uIU/mL  Iron and TIBC  Result Value Ref Range   Total Iron Binding Capacity 254 250 - 450 ug/dL   UIBC 967204 893111 - 810343 ug/dL   Iron 50 38 - 175169 ug/dL   Iron Saturation 20 15 - 55 %  Ferritin  Result Value Ref Range   Ferritin 215 30 - 400 ng/mL  Specimen status report  Result Value Ref Range   specimen status report Comment       Assessment & Plan:   Problem List Items Addressed This Visit      Unprioritized   Hyperlipidemia   Relevant Orders   Lipid Panel w/o Chol/HDL Ratio   Hypertension - Primary    Stable, continue present medications.        Relevant Orders   Comprehensive metabolic panel   Microalbumin, Urine Waived   Tobacco use    Chantix rx given       Other Visit Diagnoses    Normocytic anemia       Relevant Orders   CBC With Differential/Platelet   VITAMIN D 25 Hydroxy (Vit-D Deficiency, Fractures)   UA/M w/rflx Culture, Routine       Follow up plan: Return in about 6 months (around 08/08/2016) for physical.

## 2016-02-10 LAB — COMPREHENSIVE METABOLIC PANEL
A/G RATIO: 1.2 (ref 1.2–2.2)
ALBUMIN: 3.6 g/dL (ref 3.6–4.8)
ALT: 12 IU/L (ref 0–44)
AST: 13 IU/L (ref 0–40)
Alkaline Phosphatase: 81 IU/L (ref 39–117)
BILIRUBIN TOTAL: 0.3 mg/dL (ref 0.0–1.2)
BUN / CREAT RATIO: 20 (ref 10–24)
BUN: 31 mg/dL — AB (ref 8–27)
CALCIUM: 8.4 mg/dL — AB (ref 8.6–10.2)
CHLORIDE: 99 mmol/L (ref 96–106)
CO2: 22 mmol/L (ref 18–29)
Creatinine, Ser: 1.54 mg/dL — ABNORMAL HIGH (ref 0.76–1.27)
GFR, EST AFRICAN AMERICAN: 54 mL/min/{1.73_m2} — AB (ref >59)
GFR, EST NON AFRICAN AMERICAN: 47 mL/min/{1.73_m2} — AB (ref >59)
GLUCOSE: 84 mg/dL (ref 65–99)
Globulin, Total: 3 g/dL (ref 1.5–4.5)
Potassium: 4.5 mmol/L (ref 3.5–5.2)
Sodium: 137 mmol/L (ref 134–144)
TOTAL PROTEIN: 6.6 g/dL (ref 6.0–8.5)

## 2016-02-10 LAB — LIPID PANEL W/O CHOL/HDL RATIO
Cholesterol, Total: 114 mg/dL (ref 100–199)
HDL: 45 mg/dL (ref >39)
LDL CALC: 26 mg/dL (ref 0–99)
Triglycerides: 215 mg/dL — ABNORMAL HIGH (ref 0–149)
VLDL CHOLESTEROL CAL: 43 mg/dL — AB (ref 5–40)

## 2016-02-10 LAB — VITAMIN D 25 HYDROXY (VIT D DEFICIENCY, FRACTURES): VIT D 25 HYDROXY: 29.2 ng/mL — AB (ref 30.0–100.0)

## 2016-02-14 ENCOUNTER — Telehealth: Payer: Self-pay | Admitting: Unknown Physician Specialty

## 2016-02-14 DIAGNOSIS — R311 Benign essential microscopic hematuria: Secondary | ICD-10-CM

## 2016-02-14 DIAGNOSIS — N183 Chronic kidney disease, stage 3 unspecified: Secondary | ICD-10-CM

## 2016-02-14 DIAGNOSIS — R319 Hematuria, unspecified: Secondary | ICD-10-CM | POA: Insufficient documentation

## 2016-02-14 NOTE — Telephone Encounter (Signed)
Discussed with pt about hematuria.  Refer to Urology.  Also Creatnine is low with anemia of chronic disease.  Refer to Nephrlogy

## 2016-03-07 ENCOUNTER — Encounter: Payer: Self-pay | Admitting: Urology

## 2016-03-07 ENCOUNTER — Ambulatory Visit: Payer: Commercial Managed Care - HMO | Admitting: Urology

## 2016-03-07 VITALS — BP 116/73 | HR 92 | Ht 73.2 in | Wt 139.0 lb

## 2016-03-07 DIAGNOSIS — R351 Nocturia: Secondary | ICD-10-CM | POA: Diagnosis not present

## 2016-03-07 DIAGNOSIS — R3129 Other microscopic hematuria: Secondary | ICD-10-CM | POA: Diagnosis not present

## 2016-03-07 NOTE — Progress Notes (Signed)
03/07/2016 3:14 PM   Aaron Boyd 01-30-1951 [id number]  Referring provider: Gabriel Cirri, NP [address], [place] [address]  Chief Complaint  Patient presents with  . New Patient (Initial Visit)    Hematuria    HPI: Patient is a 65 -year-old Caucasian male who presents today with his wife, Aaron Boyd, as a referral from their PCP, Gabriel Cirri, NP, for microscopic hematuria.    Patient was found to have microscopic hematuria on 02/09/2016 with 3-10 RBC's/hpf.  Patient doesn't have a prior history of microscopic hematuria.    He does not have a prior history of recurrent urinary tract infections, nephrolithiasis, trauma to the genitourinary tract, BPH or malignancies of the genitourinary tract.  He does/does not have a family medical history of nephrolithiasis, malignancies of the genitourinary tract or hematuria.   Today, he is having symptoms of nocturia, hesitancy and a weak urinary stream.  He is not having symptoms of frequent urination, urgency, dysuria, incontinence, hesitancy and straining to urinate.  His UA today demonstrates 11/30 RBC's.    He is not experiencing any suprapubic pain, abdominal pain or flank pain. He denies any recent fevers, chills, nausea or vomiting.   He has not had any recent imaging studies.   He is a smoker.     PMH: Past Medical History:  Diagnosis Date  . CVA (cerebral infarction) 2005  . ED (erectile dysfunction)   . Hematuria   . Hyperlipidemia   . Hypertension     Surgical History: Past Surgical History:  Procedure Laterality Date  . clavicle surgery  1970  . EYE SURGERY Left   . left ear trauma    . varicose veins      Home Medications:    Medication List       Accurate as of 03/07/16  3:14 PM. Always use your most recent med list.          acetaminophen 325 MG tablet Commonly known as:  TYLENOL Take 650 mg by mouth every 6 (six) hours as needed.   amLODipine 5 MG tablet Commonly known as:  NORVASC Take 1  tablet (5 mg total) by mouth daily.   aspirin 325 MG tablet Take 325 mg by mouth daily.   atorvastatin 20 MG tablet Commonly known as:  LIPITOR Take 1 tablet (20 mg total) by mouth daily.   CENTRUM SILVER 50+MEN PO Take 1 tablet by mouth daily.   lisinopril-hydrochlorothiazide 20-25 MG tablet Commonly known as:  PRINZIDE,ZESTORETIC Take 1 tablet by mouth daily.   varenicline 0.5 MG tablet Commonly known as:  CHANTIX Take 1 tablet (0.5 mg total) by mouth 2 (two) times daily.   varenicline 1 MG tablet Commonly known as:  CHANTIX CONTINUING MONTH PAK Take 1 tablet (1 mg total) by mouth 2 (two) times daily.       Allergies: No Known Allergies  Family History: Family History  Problem Relation Age of Onset  . Diabetes Mother   . Heart disease Father   . Hypertension Father   . Heart disease Brother     Social History:  reports that he has been smoking Cigarettes.  He has been smoking about 0.50 packs per day. He has never used smokeless tobacco. He reports that he drinks alcohol. He reports that he does not use drugs.  ROS: UROLOGY Frequent Urination?: No Hard to postpone urination?: No Burning/pain with urination?: No Get up at night to urinate?: Yes Leakage of urine?: No Urine stream starts and stops?: No  Trouble starting stream?: Yes Do you have to strain to urinate?: No Blood in urine?: Yes Urinary tract infection?: No Sexually transmitted disease?: No Injury to kidneys or bladder?: No Painful intercourse?: No Weak stream?: Yes Erection problems?: Yes Penile pain?: No  Gastrointestinal Nausea?: No Vomiting?: No Indigestion/heartburn?: No Diarrhea?: No Constipation?: No  Constitutional Fever: No Night sweats?: No Weight loss?: No Fatigue?: Yes  Skin Skin rash/lesions?: No Itching?: No  Eyes Blurred vision?: Yes Double vision?: No  Ears/Nose/Throat Sore throat?: No Sinus problems?: No  Hematologic/Lymphatic Swollen glands?: No Easy  bruising?: No  Cardiovascular Leg swelling?: No Chest pain?: Yes  Respiratory Cough?: No Shortness of breath?: Yes  Endocrine Excessive thirst?: No  Musculoskeletal Back pain?: Yes Joint pain?: Yes  Neurological Headaches?: No Dizziness?: Yes  Psychologic Depression?: No Anxiety?: No  Physical Exam: BP 116/73 (BP Location: Right Arm, Patient Position: Sitting, Cuff Size: Normal)   Pulse 92   Ht 6' 1.2" (1.859 m)   Wt 139 lb (63 kg)   BMI 18.24 kg/m   Constitutional: Well nourished. Alert and oriented, No acute distress. HEENT: [place] AT, moist mucus membranes. Trachea midline, no masses. Cardiovascular: No clubbing, cyanosis, or edema. Respiratory: Normal respiratory effort, no increased work of breathing. GI: Abdomen is soft, non tender, non distended, no abdominal masses. Liver and spleen not palpable.  No hernias appreciated.  Stool sample for occult testing is not indicated.   GU: No CVA tenderness.  No bladder fullness or masses.  Patient with circumcised phallus.  Urethral meatus is patent.  No penile discharge. No penile lesions or rashes. Scrotum without lesions, cysts, rashes and/or edema.  Testicles are located scrotally bilaterally. They are atrophic.  No masses are appreciated in the testicles. Left and right epididymis are normal. Rectal: Patient with  normal sphincter tone. Anus and perineum without scarring or rashes. No rectal masses are appreciated. Prostate is approximately 35 grams, no nodules are appreciated. Seminal vesicles are normal. Skin: No rashes, bruises or suspicious lesions. Lymph: No cervical or inguinal adenopathy. Neurologic: Grossly intact, no focal deficits, moving all 4 extremities. Psychiatric: Normal mood and affect.  Laboratory Data: Lab Results  Component Value Date   WBC 9.3 02/09/2016   HCT 34.5 (L) 02/09/2016   MCV 91 02/09/2016   PLT 210 02/09/2016    Lab Results  Component Value Date   CREATININE 1.54 (H) 02/09/2016     Lab Results  Component Value Date   TSH 1.320 11/05/2015       Component Value Date/Time   CHOL 114 02/09/2016 1349   CHOL 138 01/19/2015 1421   HDL 45 02/09/2016 1349   VLDL 23 01/19/2015 1421   LDLCALC 26 02/09/2016 1349    Lab Results  Component Value Date   AST 13 02/09/2016   Lab Results  Component Value Date   ALT 12 02/09/2016    Urinalysis 11-30 RBC's.  See EPIC.   Assessment & Plan:    1. Microscopic hematuria   - I explained to the patient that there are a number of causes that can be associated with blood in the urine, such as stones, BPH, UTI's, damage to the urinary tract and/or cancer.  - At this time, I felt that the patient warranted further urologic evaluation.   The AUA guidelines state that a CT urogram is the preferred imaging study to evaluate hematuria.  - I explained to the patient that a contrast material will be injected into a vein and that in rare instances, an  allergic reaction can result and may even life threatening   The patient denies any allergies to contrast, iodine and/or seafood and is not taking metformin.  - Following the imaging study,  I've recommended a cystoscopy. I described how this is performed, typically in an office setting with a flexible cystoscope. We described the risks, benefits, and possible side effects, the most common of which is a minor amount of blood in the urine and/or burning which usually resolves in 24 to 48 hours.    - The patient had the opportunity to ask questions which were answered. Based upon this discussion, the patient is willing to proceed. Therefore, I've ordered: a CT Urogram and cystoscopy.  - The patient will return following all of the above for discussion of the results.    - BUN + creatinine  - urinalysis  - urine culture  2. Nocturia  - I explained to the patient that nocturia is often multi-factorial and difficult to treat.  Sleeping disorders, heart conditions, peripheral vascular  disease, diabetes, an enlarged prostate for men, an urethral stricture causing bladder outlet obstruction and/or certain medications can contribute to nocturia.  - I have suggested that the patient avoid caffeine after noon and alcohol in the evening.  He or she may also benefit from fluid restrictions after 6:00 in the evening and voiding just prior to bedtime.  - I have explained that research studies have showed that over 84% of patients with sleep apnea reported frequent nighttime urination.   With sleep apnea, oxygen decreases, carbon dioxide increases, the blood become more acidic, the heart rate drops and blood vessels in the lung constrict.  The body is then alerted that something is very wrong. The sleeper must wake enough to reopen the airway. By this time, the heart is racing and experiences a false signal of fluid overload. The heart excretes a hormone-like protein that tells the body to get rid of sodium and water, resulting in nocturia.  -  I also informed the patient that a recent study noted that decreasing sodium intake to 2.3 grams daily, if they don't have issues with hyponatremia, can also reduce the number of nightly voids  - The patient may benefit from a discussion with his or her primary care physician to see if he or she has risk factors for sleep apnea or other sleep disturbances and obtaining a sleep study. - sleep study was recommended but he finds it cost prohibitive  - PSA  Return for CT Urogram report and cystoscopy.  These notes generated with voice recognition software. I apologize for typographical errors.  Michiel CowboySHANNON Kesleigh Morson, PA-C  [facility] [address], [address] [place], [place] [address] [phone]

## 2016-03-08 LAB — URINALYSIS, COMPLETE
Bilirubin, UA: NEGATIVE
Glucose, UA: NEGATIVE
Ketones, UA: NEGATIVE
Leukocytes, UA: NEGATIVE
NITRITE UA: NEGATIVE
Protein, UA: NEGATIVE
Specific Gravity, UA: 1.015 (ref 1.005–1.030)
UUROB: 0.2 mg/dL (ref 0.2–1.0)
pH, UA: 5 (ref 5.0–7.5)

## 2016-03-08 LAB — BUN+CREAT
BUN / CREAT RATIO: 28 — AB (ref 10–24)
BUN: 40 mg/dL — AB (ref 8–27)
CREATININE: 1.44 mg/dL — AB (ref 0.76–1.27)
GFR calc Af Amer: 59 mL/min/{1.73_m2} — ABNORMAL LOW (ref >59)
GFR, EST NON AFRICAN AMERICAN: 51 mL/min/{1.73_m2} — AB (ref >59)

## 2016-03-08 LAB — MICROSCOPIC EXAMINATION
BACTERIA UA: NONE SEEN
Epithelial Cells (non renal): NONE SEEN /hpf (ref 0–10)
WBC, UA: NONE SEEN /hpf (ref 0–?)

## 2016-03-08 LAB — PSA: PROSTATE SPECIFIC AG, SERUM: 1.2 ng/mL (ref 0.0–4.0)

## 2016-03-14 LAB — CULTURE, URINE COMPREHENSIVE

## 2016-03-16 ENCOUNTER — Ambulatory Visit
Admission: RE | Admit: 2016-03-16 | Discharge: 2016-03-16 | Disposition: A | Discharge status: Home / Self Care | Payer: Commercial Managed Care - HMO | Source: Ambulatory Visit | Attending: Urology | Admitting: Urology

## 2016-03-16 DIAGNOSIS — R3129 Other microscopic hematuria: Secondary | ICD-10-CM

## 2016-03-16 DIAGNOSIS — R3121 Asymptomatic microscopic hematuria: Secondary | ICD-10-CM | POA: Diagnosis not present

## 2016-03-16 DIAGNOSIS — I7789 Other specified disorders of arteries and arterioles: Secondary | ICD-10-CM | POA: Diagnosis not present

## 2016-03-16 DIAGNOSIS — N401 Enlarged prostate with lower urinary tract symptoms: Secondary | ICD-10-CM | POA: Diagnosis not present

## 2016-03-16 DIAGNOSIS — R351 Nocturia: Secondary | ICD-10-CM | POA: Diagnosis not present

## 2016-03-16 DIAGNOSIS — N329 Bladder disorder, unspecified: Secondary | ICD-10-CM | POA: Diagnosis not present

## 2016-03-16 DIAGNOSIS — I77819 Aortic ectasia, unspecified site: Secondary | ICD-10-CM | POA: Insufficient documentation

## 2016-03-16 DIAGNOSIS — I700 Atherosclerosis of aorta: Secondary | ICD-10-CM | POA: Diagnosis not present

## 2016-03-16 DIAGNOSIS — N261 Atrophy of kidney (terminal): Secondary | ICD-10-CM | POA: Insufficient documentation

## 2016-03-16 DIAGNOSIS — I2510 Atherosclerotic heart disease of native coronary artery without angina pectoris: Secondary | ICD-10-CM | POA: Diagnosis not present

## 2016-03-16 MED ORDER — IOPAMIDOL (ISOVUE-300) INJECTION 61%
100.0000 mL | Freq: Once | INTRAVENOUS | Status: AC | PRN
  Administered 2016-03-16: 100 mL via INTRAVENOUS

## 2016-03-29 ENCOUNTER — Other Ambulatory Visit: Payer: Self-pay | Admitting: Unknown Physician Specialty

## 2016-03-31 ENCOUNTER — Ambulatory Visit (INDEPENDENT_AMBULATORY_CARE_PROVIDER_SITE_OTHER): Payer: Commercial Managed Care - HMO | Admitting: Urology

## 2016-03-31 VITALS — BP 128/68 | HR 78 | Ht 73.0 in | Wt 142.0 lb

## 2016-03-31 DIAGNOSIS — R3129 Other microscopic hematuria: Secondary | ICD-10-CM

## 2016-03-31 DIAGNOSIS — N289 Disorder of kidney and ureter, unspecified: Secondary | ICD-10-CM

## 2016-03-31 DIAGNOSIS — N261 Atrophy of kidney (terminal): Secondary | ICD-10-CM

## 2016-03-31 DIAGNOSIS — I714 Abdominal aortic aneurysm, without rupture, unspecified: Secondary | ICD-10-CM

## 2016-03-31 LAB — URINALYSIS, COMPLETE
BILIRUBIN UA: NEGATIVE
Glucose, UA: NEGATIVE
KETONES UA: NEGATIVE
LEUKOCYTES UA: NEGATIVE
Nitrite, UA: NEGATIVE
PH UA: 7 (ref 5.0–7.5)
PROTEIN UA: NEGATIVE
SPEC GRAV UA: 1.02 (ref 1.005–1.030)
Urobilinogen, Ur: 0.2 mg/dL (ref 0.2–1.0)

## 2016-03-31 LAB — MICROSCOPIC EXAMINATION
BACTERIA UA: NONE SEEN
Epithelial Cells (non renal): NONE SEEN /hpf (ref 0–10)
RBC, UA: >30 /hpf — AB (ref 0–?)

## 2016-03-31 MED ORDER — CIPROFLOXACIN HCL 500 MG PO TABS
500.0000 mg | ORAL_TABLET | Freq: Once | ORAL | Status: AC
  Administered 2016-03-31: 500 mg via ORAL

## 2016-03-31 MED ORDER — LIDOCAINE HCL 2 % EX GEL
1.0000 "application " | Freq: Once | CUTANEOUS | Status: AC
  Administered 2016-03-31: 1 via URETHRAL

## 2016-03-31 NOTE — Progress Notes (Signed)
   03/31/16  CC:  Chief Complaint  Patient presents with  . Cysto    microscopic hematuria     HPI: The patient is a 65 year old male presents today for completion of the microscopic hematuria workup. He is a smoker.  CT was positive for bladder wall thickening as well as moderate right renal atrophy due to renal artery stenosis. There was a suspicious lesion that was indeterminate within this right kidney were follow-up imaging was suggested. There was also a infrarenal aortic aneurysm that will require follow-up per the radiologist.  Blood pressure 128/68, pulse 78, height 6\' 1"  (1.854 m), weight 142 lb (64.4 kg). NED. A&Ox3.   No respiratory distress   Abd soft, NT, ND Normal phallus with bilateral descended testicles  Cystoscopy Procedure Note  Patient identification was confirmed, informed consent was obtained, and patient was prepped using Betadine solution.  Lidocaine jelly was administered per urethral meatus.    Preoperative abx where received prior to procedure.     Pre-Procedure: - Inspection reveals a normal caliber ureteral meatus.  Procedure: The flexible cystoscope was introduced without difficulty - No urethral strictures/lesions are present. - Enlarged prostate Visually obstuctive - Normal bladder neck - Bilateral ureteral orifices identified - Bladder mucosa  reveals no ulcers, tumors, or lesions - No bladder stones - No trabeculation  Retroflexion shows no intravesical lobe   Post-Procedure: - Patient tolerated the procedure well  Assessment/ Plan:  1. Microscopic hematuria -negative workup. Repeat urinalysis in one year  2. Right renal atrophy with small lesion Repeat imaging in 1 year with MRI renal mass protocol to ensure stability  3. Infrarenal aorta aneurysm I give the patient a copy of these findings and discussed that it's recommended that he has an ultrasound in 1 year. He will follow-up with his primary care provider to discuss this  further.

## 2016-04-05 DIAGNOSIS — R319 Hematuria, unspecified: Secondary | ICD-10-CM | POA: Diagnosis not present

## 2016-04-05 DIAGNOSIS — N183 Chronic kidney disease, stage 3 (moderate): Secondary | ICD-10-CM | POA: Diagnosis not present

## 2016-04-05 DIAGNOSIS — I129 Hypertensive chronic kidney disease with stage 1 through stage 4 chronic kidney disease, or unspecified chronic kidney disease: Secondary | ICD-10-CM | POA: Diagnosis not present

## 2016-06-04 ENCOUNTER — Other Ambulatory Visit: Payer: Self-pay | Admitting: Unknown Physician Specialty

## 2016-07-27 DIAGNOSIS — H5203 Hypermetropia, bilateral: Secondary | ICD-10-CM | POA: Diagnosis not present

## 2016-08-08 ENCOUNTER — Ambulatory Visit: Payer: Commercial Managed Care - HMO | Admitting: Unknown Physician Specialty

## 2016-08-08 ENCOUNTER — Ambulatory Visit (INDEPENDENT_AMBULATORY_CARE_PROVIDER_SITE_OTHER): Payer: Medicare HMO | Admitting: Unknown Physician Specialty

## 2016-08-08 ENCOUNTER — Encounter: Payer: Self-pay | Admitting: Unknown Physician Specialty

## 2016-08-08 VITALS — BP 160/81 | HR 79 | Temp 98.7°F | Ht 71.5 in | Wt 148.6 lb

## 2016-08-08 DIAGNOSIS — N183 Chronic kidney disease, stage 3 unspecified: Secondary | ICD-10-CM

## 2016-08-08 DIAGNOSIS — J449 Chronic obstructive pulmonary disease, unspecified: Secondary | ICD-10-CM | POA: Diagnosis not present

## 2016-08-08 DIAGNOSIS — I10 Essential (primary) hypertension: Secondary | ICD-10-CM | POA: Diagnosis not present

## 2016-08-08 DIAGNOSIS — Z0001 Encounter for general adult medical examination with abnormal findings: Secondary | ICD-10-CM | POA: Diagnosis not present

## 2016-08-08 DIAGNOSIS — R351 Nocturia: Secondary | ICD-10-CM

## 2016-08-08 DIAGNOSIS — Z7189 Other specified counseling: Secondary | ICD-10-CM

## 2016-08-08 DIAGNOSIS — Z23 Encounter for immunization: Secondary | ICD-10-CM

## 2016-08-08 DIAGNOSIS — I779 Disorder of arteries and arterioles, unspecified: Secondary | ICD-10-CM | POA: Diagnosis not present

## 2016-08-08 DIAGNOSIS — Z0000 Encounter for general adult medical examination without abnormal findings: Secondary | ICD-10-CM

## 2016-08-08 DIAGNOSIS — Z720 Tobacco use: Secondary | ICD-10-CM

## 2016-08-08 DIAGNOSIS — I739 Peripheral vascular disease, unspecified: Secondary | ICD-10-CM

## 2016-08-08 MED ORDER — AMLODIPINE BESYLATE 5 MG PO TABS: 5.0000 mg | ORAL_TABLET | Freq: Every day | ORAL | 1 refills | Status: DC

## 2016-08-08 NOTE — Progress Notes (Signed)
BP (!) 160/81   Pulse 79   Temp 98.7 F (37.1 C)   Ht 5' 11.5" (1.816 m)   Wt 148 lb 9.6 oz (67.4 kg)   SpO2 100%   BMI 20.44 kg/m    Subjective:    Patient ID: Aaron Boyd, male    DOB: 19-Nov-1950, 66 y.o.   MRN: [id number]  HPI: Aaron LaineRobert D Eschmann is a 66 y.o. male  Chief Complaint  Patient presents with  . Medicare Wellness   Functional Status Survey: Is the patient deaf or have difficulty hearing?: Yes Does the patient have difficulty seeing, even when wearing glasses/contacts?: Yes Does the patient have difficulty concentrating, remembering, or making decisions?: No Does the patient have difficulty walking or climbing stairs?: No Does the patient have difficulty dressing or bathing?: No Does the patient have difficulty doing errands alone such as visiting a doctor's office or shopping?: No Fall Risk  08/08/2016  Falls in the past year? No     Depression screen PHQ 2/9 08/08/2016  Decreased Interest 0  Down, Depressed, Hopeless 0  PHQ - 2 Score 0  Altered sleeping 0  Tired, decreased energy 0  Change in appetite 0  Feeling bad or failure about yourself  0  Trouble concentrating 0  Moving slowly or fidgety/restless 0  Suicidal thoughts 0  PHQ-9 Score 0    Hypertension Using medications without difficulty.  He did not take his BP meds this AM as he hasn't eaten anything Average home BPs: not checking   No problems or lightheadedness No chest pain with exertion or shortness of breath No Edema   Hyperlipidemia Using medications without problems: No Muscle aches  Diet compliance: eats well Exercise: Active in yard  Tobacco Started on Chantix last visit.  Stopped smoking 7 weeks ago  Reviewed notes from cardiology over a year ago.  A 6 month f/u was recommended.  Additionally, stenting of bilateral carotid arteries was recommended.  Neither one was done.     Family History  Problem Relation Age of Onset  . Diabetes Mother   . Heart disease Father     . Hypertension Father   . Heart disease Brother    Past Medical History:  Diagnosis Date  . CVA (cerebral infarction) 2005  . ED (erectile dysfunction)   . Hematuria   . Hyperlipidemia   . Hypertension    Past Surgical History:  Procedure Laterality Date  . clavicle surgery  1970  . EYE SURGERY Left   . left ear trauma    . varicose veins      Relevant past medical, surgical, family and social history reviewed and updated as indicated. Interim medical history since our last visit reviewed. Allergies and medications reviewed and updated.  Review of Systems  Constitutional: Negative.   HENT: Negative.   Eyes: Negative.   Respiratory: Negative.   Cardiovascular: Negative.   Gastrointestinal: Negative.   Endocrine: Negative.   Genitourinary:       Wakes up twice and night to urinate and several times during the day  Musculoskeletal: Negative.   Skin: Negative.   Allergic/Immunologic: Negative.   Neurological: Negative.   Hematological: Negative.   Psychiatric/Behavioral: Negative.     Per HPI unless specifically indicated above     Objective:    BP (!) 160/81   Pulse 79   Temp 98.7 F (37.1 C)   Ht 5' 11.5" (1.816 m)   Wt 148 lb 9.6 oz (67.4 kg)   SpO2 100%  BMI 20.44 kg/m   Wt Readings from Last 3 Encounters:  08/08/16 148 lb 9.6 oz (67.4 kg)  03/31/16 142 lb (64.4 kg)  03/07/16 139 lb (63 kg)    Physical Exam  Constitutional: He is oriented to person, place, and time. He appears well-developed and well-nourished.  HENT:  Head: Normocephalic.  Right Ear: Tympanic membrane, external ear and ear canal normal.  Left Ear: Tympanic membrane, external ear and ear canal normal.  Mouth/Throat: Uvula is midline, oropharynx is clear and moist and mucous membranes are normal.  Eyes: Pupils are equal, round, and reactive to light.  Neck: Carotid bruit is present.  Cardiovascular: Normal rate, regular rhythm and normal heart sounds.  Exam reveals no gallop and  no friction rub.   No murmur heard. Pulmonary/Chest: Effort normal and breath sounds normal. No respiratory distress.  Abdominal: Soft. Bowel sounds are normal. He exhibits no distension. There is no tenderness.  Genitourinary: Rectum normal and prostate normal.  Musculoskeletal: Normal range of motion.  Neurological: He is alert and oriented to person, place, and time. He has normal reflexes.  Skin: Skin is warm and dry.  Psychiatric: He has a normal mood and affect. His behavior is normal. Judgment and thought content normal.       Assessment & Plan:   Problem List Items Addressed This Visit      Unprioritized   Advanced care planning/counseling discussion    A voluntary discussion about advance care planning including the explanation and discussion of advance directives was extensively discussed  with the patient.  Explanation about the health care proxy and Living will was reviewed and packet with forms with explanation of how to fill them out was given.  During this discussion, the patient was able to identify a health care proxy as his son Judie Grieve and plans to fill out the paperwork required.  They are trying to get all the kids together to fill out the papers      Bilateral carotid artery disease (HCC)    Refer to vascular as stenting was not done and review of chart shows significant vascular disease      Relevant Medications   amLODipine (NORVASC) 5 MG tablet   Other Relevant Orders   Ambulatory referral to Vascular Surgery   Chronic kidney disease, stage 3 (Chronic)   Relevant Orders   CBC with Differential/Platelet   Chronic obstructive pulmonary disease (HCC)    No problems since quit smoking       Essential (primary) hypertension    Not to goal and did not take BP meds.  Will recheck 6 weeks      Relevant Medications   amLODipine (NORVASC) 5 MG tablet   Other Relevant Orders   Comprehensive metabolic panel   Lipid Panel w/o Chol/HDL Ratio   RESOLVED: Tobacco  use    Quit for 7 months       Other Visit Diagnoses    Need for pneumococcal vaccination    -  Primary   Relevant Orders   Pneumococcal conjugate vaccine 13-valent IM (Completed)   Nocturia       Relevant Orders   PSA   Annual physical exam           Follow up plan: Return in about 6 weeks (around 09/19/2016).

## 2016-08-08 NOTE — Assessment & Plan Note (Signed)
Quit for 7 months

## 2016-08-08 NOTE — Assessment & Plan Note (Addendum)
Not to goal and did not take BP meds.  Will recheck 6 weeks

## 2016-08-08 NOTE — Assessment & Plan Note (Signed)
No problems since quit smoking

## 2016-08-08 NOTE — Assessment & Plan Note (Signed)
A voluntary discussion about advance care planning including the explanation and discussion of advance directives was extensively discussed  with the patient.  Explanation about the health care proxy and Living will was reviewed and packet with forms with explanation of how to fill them out was given.  During this discussion, the patient was able to identify a health care proxy as his son Judie GrieveBryan and plans to fill out the paperwork required.  They are trying to get all the kids together to fill out the papers

## 2016-08-08 NOTE — Assessment & Plan Note (Addendum)
Refer to vascular as stenting was not done and review of chart shows significant vascular disease

## 2016-08-08 NOTE — Patient Instructions (Addendum)
Pneumococcal Conjugate Vaccine (PCV13) What You Need to Know 1. Why get vaccinated? Vaccination can protect both children and adults from pneumococcal disease. Pneumococcal disease is caused by bacteria that can spread from person to person through close contact. It can cause ear infections, and it can also lead to more serious infections of the:  Lungs (pneumonia),  Blood (bacteremia), and  Covering of the brain and spinal cord (meningitis).  Pneumococcal pneumonia is most common among adults. Pneumococcal meningitis can cause deafness and brain damage, and it kills about 1 child in 10 who get it. Anyone can get pneumococcal disease, but children under 2 years of age and adults 65 years and older, people with certain medical conditions, and cigarette smokers are at the highest risk. Before there was a vaccine, the [place] saw:  more than 700 cases of meningitis,  about 13,000 blood infections,  about 5 million ear infections, and  about 200 deaths  in children under 5 each year from pneumococcal disease. Since vaccine became available, severe pneumococcal disease in these children has fallen by 88%. About 18,000 older adults die of pneumococcal disease each year in the [place]. Treatment of pneumococcal infections with penicillin and other drugs is not as effective as it used to be, because some strains of the disease have become resistant to these drugs. This makes prevention of the disease, through vaccination, even more important. 2. PCV13 vaccine Pneumococcal conjugate vaccine (called PCV13) protects against 13 types of pneumococcal bacteria. PCV13 is routinely given to children at 2, 4, 6, and 12-15 months of age. It is also recommended for children and adults 2 to 64 years of age with certain health conditions, and for all adults 65 years of age and older. Your doctor can give you details. 3. Some people should not get this vaccine Anyone who has ever had a  life-threatening allergic reaction to a dose of this vaccine, to an earlier pneumococcal vaccine called PCV7, or to any vaccine containing diphtheria toxoid (for example, DTaP), should not get PCV13. Anyone with a severe allergy to any component of PCV13 should not get the vaccine. Tell your doctor if the person being vaccinated has any severe allergies. If the person scheduled for vaccination is not feeling well, your healthcare provider might decide to reschedule the shot on another day. 4. Risks of a vaccine reaction With any medicine, including vaccines, there is a chance of reactions. These are usually mild and go away on their own, but serious reactions are also possible. Problems reported following PCV13 varied by age and dose in the series. The most common problems reported among children were:  About half became drowsy after the shot, had a temporary loss of appetite, or had redness or tenderness where the shot was given.  About 1 out of 3 had swelling where the shot was given.  About 1 out of 3 had a mild fever, and about 1 in 20 had a fever over 102.2F.  Up to about 8 out of 10 became fussy or irritable.  Adults have reported pain, redness, and swelling where the shot was given; also mild fever, fatigue, headache, chills, or muscle pain. Young children who get PCV13 along with inactivated flu vaccine at the same time may be at increased risk for seizures caused by fever. Ask your doctor for more information. Problems that could happen after any vaccine:  People sometimes faint after a medical procedure, including vaccination. Sitting or lying down for about 15 minutes can help prevent   fainting, and injuries caused by a fall. Tell your doctor if you feel dizzy, or have vision changes or ringing in the ears.  Some older children and adults get severe pain in the shoulder and have difficulty moving the arm where a shot was given. This happens very rarely.  Any medication can cause a  severe allergic reaction. Such reactions from a vaccine are very rare, estimated at about 1 in a million doses, and would happen within a few minutes to a few hours after the vaccination. As with any medicine, there is a very small chance of a vaccine causing a serious injury or death. The safety of vaccines is always being monitored. For more information, visit: www.cdc.gov/vaccinesafety/ 5. What if there is a serious reaction? What should I look for? Look for anything that concerns you, such as signs of a severe allergic reaction, very high fever, or unusual behavior. Signs of a severe allergic reaction can include hives, swelling of the face and throat, difficulty breathing, a fast heartbeat, dizziness, and weakness-usually within a few minutes to a few hours after the vaccination. What should I do?  If you think it is a severe allergic reaction or other emergency that can't wait, call 9-1-1 or get the person to the nearest hospital. Otherwise, call your doctor.  Reactions should be reported to the Vaccine Adverse Event Reporting System (VAERS). Your doctor should file this report, or you can do it yourself through the VAERS web site at www.vaers.hhs.gov, or by calling 1-800-822-7967. ? VAERS does not give medical advice. 6. The National Vaccine Injury Compensation Program The National Vaccine Injury Compensation Program (VICP) is a federal program that was created to compensate people who may have been injured by certain vaccines. Persons who believe they may have been injured by a vaccine can learn about the program and about filing a claim by calling 1-800-338-2382 or visiting the VICP website at www.hrsa.gov/vaccinecompensation. There is a time limit to file a claim for compensation. 7. How can I learn more?  Ask your healthcare provider. He or she can give you the vaccine package insert or suggest other sources of information.  Call your local or state health department.  Contact the  Centers for Disease Control and Prevention (CDC): ? Call 1-800-232-4636 (1-800-CDC-INFO) or ? Visit CDC's website at www.cdc.gov/vaccines Vaccine Information Statement, PCV13 Vaccine (02/05/2014) This information is not intended to replace advice given to you by your health care provider. Make sure you discuss any questions you have with your health care provider. Document Released: 01/15/2006 Document Revised: 12/09/2015 Document Reviewed: 12/09/2015 Elsevier Interactive Patient Education  2017 Else[employer] Inc.  

## 2016-08-09 ENCOUNTER — Encounter: Payer: Self-pay | Admitting: Unknown Physician Specialty

## 2016-08-09 LAB — CBC WITH DIFFERENTIAL/PLATELET
BASOS: 0 %
Basophils Absolute: 0 10*3/uL (ref 0.0–0.2)
EOS (ABSOLUTE): 0.1 10*3/uL (ref 0.0–0.4)
Eos: 1 %
Hematocrit: 35.4 % — ABNORMAL LOW (ref 37.5–51.0)
Hemoglobin: 11.8 g/dL — ABNORMAL LOW (ref 13.0–17.7)
Immature Grans (Abs): 0 10*3/uL (ref 0.0–0.1)
Immature Granulocytes: 0 %
Lymphocytes Absolute: 2.4 10*3/uL (ref 0.7–3.1)
Lymphs: 27 %
MCH: 28.9 pg (ref 26.6–33.0)
MCHC: 33.3 g/dL (ref 31.5–35.7)
MCV: 87 fL (ref 79–97)
MONOS ABS: 0.4 10*3/uL (ref 0.1–0.9)
Monocytes: 5 %
NEUTROS ABS: 5.9 10*3/uL (ref 1.4–7.0)
Neutrophils: 67 %
Platelets: 233 10*3/uL (ref 150–379)
RBC: 4.09 x10E6/uL — ABNORMAL LOW (ref 4.14–5.80)
RDW: 14 % (ref 12.3–15.4)
WBC: 8.9 10*3/uL (ref 3.4–10.8)

## 2016-08-09 LAB — COMPREHENSIVE METABOLIC PANEL
A/G RATIO: 1.5 (ref 1.2–2.2)
ALT: 13 IU/L (ref 0–44)
AST: 17 IU/L (ref 0–40)
Albumin: 4.4 g/dL (ref 3.6–4.8)
Alkaline Phosphatase: 72 IU/L (ref 39–117)
BUN/Creatinine Ratio: 19 (ref 10–24)
BUN: 27 mg/dL (ref 8–27)
Bilirubin Total: 0.7 mg/dL (ref 0.0–1.2)
CALCIUM: 8.7 mg/dL (ref 8.6–10.2)
CO2: 22 mmol/L (ref 18–29)
Chloride: 96 mmol/L (ref 96–106)
Creatinine, Ser: 1.4 mg/dL — ABNORMAL HIGH (ref 0.76–1.27)
GFR calc Af Amer: 61 mL/min/{1.73_m2} (ref >59)
GFR, EST NON AFRICAN AMERICAN: 52 mL/min/{1.73_m2} — AB (ref >59)
GLOBULIN, TOTAL: 3 g/dL (ref 1.5–4.5)
Glucose: 82 mg/dL (ref 65–99)
POTASSIUM: 4.5 mmol/L (ref 3.5–5.2)
SODIUM: 135 mmol/L (ref 134–144)
Total Protein: 7.4 g/dL (ref 6.0–8.5)

## 2016-08-09 LAB — LIPID PANEL W/O CHOL/HDL RATIO
Cholesterol, Total: 144 mg/dL (ref 100–199)
HDL: 57 mg/dL (ref >39)
LDL CALC: 70 mg/dL (ref 0–99)
Triglycerides: 83 mg/dL (ref 0–149)
VLDL CHOLESTEROL CAL: 17 mg/dL (ref 5–40)

## 2016-08-09 LAB — PSA: Prostate Specific Ag, Serum: 1.7 ng/mL (ref 0.0–4.0)

## 2016-08-19 DIAGNOSIS — Z0100 Encounter for examination of eyes and vision without abnormal findings: Secondary | ICD-10-CM | POA: Diagnosis not present

## 2016-09-05 ENCOUNTER — Encounter (INDEPENDENT_AMBULATORY_CARE_PROVIDER_SITE_OTHER): Payer: Self-pay | Admitting: Vascular Surgery

## 2016-09-05 ENCOUNTER — Ambulatory Visit (INDEPENDENT_AMBULATORY_CARE_PROVIDER_SITE_OTHER): Payer: Medicare HMO | Admitting: Vascular Surgery

## 2016-09-05 VITALS — BP 105/55 | HR 85 | Resp 16 | Wt 151.0 lb

## 2016-09-05 DIAGNOSIS — N183 Chronic kidney disease, stage 3 unspecified: Secondary | ICD-10-CM

## 2016-09-05 DIAGNOSIS — I779 Disorder of arteries and arterioles, unspecified: Secondary | ICD-10-CM

## 2016-09-05 DIAGNOSIS — I10 Essential (primary) hypertension: Secondary | ICD-10-CM

## 2016-09-05 DIAGNOSIS — E782 Mixed hyperlipidemia: Secondary | ICD-10-CM | POA: Diagnosis not present

## 2016-09-05 DIAGNOSIS — I739 Peripheral vascular disease, unspecified: Principal | ICD-10-CM

## 2016-09-05 NOTE — Assessment & Plan Note (Signed)
blood pressure control important in reducing the progression of atherosclerotic disease. On appropriate oral medications.  

## 2016-09-05 NOTE — Assessment & Plan Note (Signed)
lipid control important in reducing the progression of atherosclerotic disease. Continue statin therapy  

## 2016-09-05 NOTE — Assessment & Plan Note (Signed)
It has been about a year and a half since his last carotid ultrasound. This should be rechecked as he does have a known carotid artery occlusion on the left and moderate carotid artery stenosis on the right. Continue aspirin and a statin agent. Return to our office with a carotid duplex in the near future at his convenience.

## 2016-09-05 NOTE — Patient Instructions (Signed)
Carotid Artery Disease The carotid arteries are arteries on both sides of the neck. They carry blood to the brain. Carotid artery disease is when the arteries get smaller (narrow) or get blocked. If these arteries get smaller or get blocked, you are more likely to have a stroke or warning stroke (transient ischemic attack). Follow these instructions at home:  Take medicines as told by your doctor. Make sure you understand all your medicine instructions. Do not stop your medicines without talking to your doctor first.  Follow your doctor's diet instructions. It is important to eat a healthy diet that includes plenty of: ? Fresh fruits. ? Vegetables. ? Lean meats.  Avoid: ? High-fat foods. ? High-sodium foods. ? Foods that are fried, overly processed, or have poor nutritional value.  Stay a healthy weight.  Stay active. Get at least 30 minutes of activity every day.  Do not smoke.  Limit alcohol use to: ? No more than 2 drinks a day for men. ? No more than 1 drink a day for women who are not pregnant.  Do not use illegal drugs.  Keep all doctor visits as told. Get help right away if:  You have sudden weakness or loss of feeling (numbness) on one side of the body, such as the face, arm, or leg.  You have sudden confusion.  You have trouble speaking (aphasia) or understanding.  You have sudden trouble seeing out of one or both eyes.  You have sudden trouble walking.  You have dizziness or feel like you might pass out (faint).  You have a loss of balance or your movements are not steady (uncoordinated).  You have a sudden, severe headache with no known cause.  You have trouble swallowing (dysphagia). Call your local emergency services (911 in [place] Do notdrive yourself to the clinic or hospital. This information is not intended to replace advice given to you by your health care provider. Make sure you discuss any questions you have with your health care  provider. Document Released: 03/06/2012 Document Revised: 08/26/2015 Document Reviewed: 09/18/2012 Elsevier Interactive Patient Education  2018 Elsevier Inc.  

## 2016-09-05 NOTE — Assessment & Plan Note (Signed)
We'll try to avoid contrast used for evaluation of less stenosis becomes very severe.

## 2016-09-05 NOTE — Progress Notes (Signed)
MRN : [id number]  Aaron Boyd is a 66 y.o. (04/24/1950) male who presents with chief complaint of  Chief Complaint  Patient presents with  . Follow-up    CAD  .  History of Present Illness: I am asked to see the patient by Gabriel Cirri, NP for evaluation of carotid artery stenosis. He was seen about a year and a half ago but did not follow-up and have his follow-up ultrasound towards the end of last year. His last ultrasound was over a year ago where he was found to have moderate right carotid artery stenosis and a chronic occlusion of the left carotid artery. He does have a previous history of stroke but none since his last visit. He and his wife both stop smoking within the last month and they're both congratulated for this. He denies fevers or chills. He has no chest pain or shortness of breath.  Current Outpatient Prescriptions  Medication Sig Dispense Refill  . acetaminophen (TYLENOL) 325 MG tablet Take 650 mg by mouth every 6 (six) hours as needed.    Marland Kitchen amLODipine (NORVASC) 5 MG tablet Take 1 tablet (5 mg total) by mouth daily. 90 tablet 1  . aspirin 325 MG tablet Take 325 mg by mouth daily.    Marland Kitchen atorvastatin (LIPITOR) 20 MG tablet TAKE 1 TABLET EVERY DAY 90 tablet 1  . lisinopril-hydrochlorothiazide (PRINZIDE,ZESTORETIC) 20-25 MG tablet TAKE 1 TABLET EVERY DAY 90 tablet 1  . Multiple Vitamins-Minerals (CENTRUM SILVER 50+MEN PO) Take 1 tablet by mouth daily.     No current facility-administered medications for this visit.     Past Medical History:  Diagnosis Date  . CVA (cerebral infarction) 2005  . ED (erectile dysfunction)   . Hematuria   . Hyperlipidemia   . Hypertension     Past Surgical History:  Procedure Laterality Date  . clavicle surgery  1970  . EYE SURGERY Left   . left ear trauma    . varicose veins      Social History Social History  Substance Use Topics  . Smoking status: Former Smoker    Packs/day: 0.50    Types: Cigarettes    Quit date:  06/17/2016  . Smokeless tobacco: Never Used  . Alcohol use 0.0 oz/week     Comment: on occasion  No IVDU   Family History Family History  Problem Relation Age of Onset  . Diabetes Mother   . Heart disease Father   . Hypertension Father   . Heart disease Brother     No Known Allergies   REVIEW OF SYSTEMS (Negative unless checked)  Constitutional: [] Weight loss  [] Fever  [] Chills Cardiac: [] Chest pain   [] Chest pressure   [] Palpitations   [] Shortness of breath when laying flat   [] Shortness of breath at rest   [] Shortness of breath with exertion. Vascular:  [] Pain in legs with walking   [] Pain in legs at rest   [] Pain in legs when laying flat   [] Claudication   [] Pain in feet when walking  [] Pain in feet at rest  [] Pain in feet when laying flat   [] History of DVT   [] Phlebitis   [] Swelling in legs   [] Varicose veins   [] Non-healing ulcers Pulmonary:   [] Uses home oxygen   [] Productive cough   [] Hemoptysis   [] Wheeze  [] COPD   [] Asthma Neurologic:  [x] Dizziness  [] Blackouts   [] Seizures   [x] History of stroke   [x] History of TIA  [] Aphasia   [] Temporary blindness   []   Dysphagia   [] Weakness or numbness in arms   [] Weakness or numbness in legs Musculoskeletal:  [] Arthritis   [] Joint swelling   [] Joint pain   [] Low back pain Hematologic:  [] Easy bruising  [] Easy bleeding   [] Hypercoagulable state   [] Anemic  [] Hepatitis Gastrointestinal:  [] Blood in stool   [] Vomiting blood  [] Gastroesophageal reflux/heartburn   [] Difficulty swallowing. Genitourinary:  [] Chronic kidney disease   [] Difficult urination  [] Frequent urination  [] Burning with urination   [] Blood in urine Skin:  [] Rashes   [] Ulcers   [] Wounds Psychological:  [] History of anxiety   []  History of major depression.  Physical Examination  Vitals:   09/05/16 1329  BP: (!) 105/55  Pulse: 85  Resp: 16  Weight: 68.5 kg (151 lb)   Body mass index is 20.77 kg/m. Gen:  WD/WN, NAD. Appears older than stated age Head: /AT, No  temporalis wasting. Ear/Nose/Throat: Hearing grossly intact, nares w/o erythema or drainage, trachea midline Eyes: Conjunctiva clear. Sclera non-icteric Neck: Supple.  No JVD.  Pulmonary:  Good air movement, equal and clear to auscultation bilaterally.  Cardiac: RRR, normal S1, S2, no Murmurs, rubs or gallops. Vascular:  Vessel Right Left  Radial Palpable Palpable  Ulnar Palpable Palpable  Brachial Palpable Palpable  Carotid Palpable, with bruit Palpable, with bruit                       Gastrointestinal: soft, non-tender/non-distended.  Musculoskeletal: M/S 5/5 throughout.  No deformity or atrophy.  Neurologic: CN 2-12 intact. Sensation grossly intact in extremities.  Symmetrical.  Speech is fluent. Motor exam as listed above. Psychiatric: Judgment intact, Mood & affect appropriate for pt's clinical situation. Dermatologic: No rashes or ulcers noted.  No cellulitis or open wounds.      CBC Lab Results  Component Value Date   WBC 8.9 08/08/2016   HCT 35.4 (L) 08/08/2016   MCV 87 08/08/2016   PLT 233 08/08/2016    BMET    Component Value Date/Time   NA 135 08/08/2016 1355   K 4.5 08/08/2016 1355   CL 96 08/08/2016 1355   CO2 22 08/08/2016 1355   GLUCOSE 82 08/08/2016 1355   BUN 27 08/08/2016 1355   CREATININE 1.40 (H) 08/08/2016 1355   CALCIUM 8.7 08/08/2016 1355   GFRNONAA 52 (L) 08/08/2016 1355   GFRAA 61 08/08/2016 1355   CrCl cannot be calculated (Patient's most recent lab result is older than the maximum 21 days allowed.).  COAG No results found for: INR, PROTIME  Radiology No results found.  Assessment/Plan Chronic kidney disease, stage 3 We'll try to avoid contrast used for evaluation of less stenosis becomes very severe.  Essential (primary) hypertension blood pressure control important in reducing the progression of atherosclerotic disease. On appropriate oral medications.   Hyperlipidemia, unspecified lipid control important in reducing the  progression of atherosclerotic disease. Continue statin therapy   Bilateral carotid artery disease (HCC) It has been about a year and a half since his last carotid ultrasound. This should be rechecked as he does have a known carotid artery occlusion on the left and moderate carotid artery stenosis on the right. Continue aspirin and a statin agent. Return to our office with a carotid duplex in the near future at his convenience.    Festus BarrenJason Dew, MD  09/05/2016 2:09 PM    This note was created with Dragon medical transcription system.  Any errors from dictation are purely unintentional

## 2016-09-08 ENCOUNTER — Ambulatory Visit (INDEPENDENT_AMBULATORY_CARE_PROVIDER_SITE_OTHER): Payer: Medicare HMO | Admitting: Vascular Surgery

## 2016-09-08 ENCOUNTER — Encounter (INDEPENDENT_AMBULATORY_CARE_PROVIDER_SITE_OTHER): Payer: Self-pay | Admitting: Vascular Surgery

## 2016-09-08 ENCOUNTER — Ambulatory Visit (INDEPENDENT_AMBULATORY_CARE_PROVIDER_SITE_OTHER): Payer: Medicare HMO

## 2016-09-08 VITALS — BP 121/69 | HR 61 | Resp 16 | Wt 151.0 lb

## 2016-09-08 DIAGNOSIS — I10 Essential (primary) hypertension: Secondary | ICD-10-CM | POA: Diagnosis not present

## 2016-09-08 DIAGNOSIS — N183 Chronic kidney disease, stage 3 unspecified: Secondary | ICD-10-CM

## 2016-09-08 DIAGNOSIS — I779 Disorder of arteries and arterioles, unspecified: Secondary | ICD-10-CM

## 2016-09-08 DIAGNOSIS — G459 Transient cerebral ischemic attack, unspecified: Secondary | ICD-10-CM

## 2016-09-08 DIAGNOSIS — I739 Peripheral vascular disease, unspecified: Principal | ICD-10-CM

## 2016-09-08 NOTE — Progress Notes (Signed)
MRN : [id number]  Aaron Boyd is a 66 y.o. (1950/06/27) male who presents with chief complaint of  Chief Complaint  Patient presents with  . Follow-up  .  History of Present Illness: Patient returns today in follow up of carotid stenosis. He has no new complaints or problems since his last visit. He does have a previous history of stroke but none recently. His carotid duplex today shows stable stenosis in the 40-59% range on the right. The left carotid artery either has a near occlusion or an occlusion, and based off of his previous studies demonstrating a occlusion and review of the images I believe this is chronically occluded.         Current Outpatient Prescriptions  Medication Sig Dispense Refill  . acetaminophen (TYLENOL) 325 MG tablet Take 650 mg by mouth every 6 (six) hours as needed.    Marland Kitchen. amLODipine (NORVASC) 5 MG tablet Take 1 tablet (5 mg total) by mouth daily. 90 tablet 1  . aspirin 325 MG tablet Take 325 mg by mouth daily.    Marland Kitchen. atorvastatin (LIPITOR) 20 MG tablet TAKE 1 TABLET EVERY DAY 90 tablet 1  . lisinopril-hydrochlorothiazide (PRINZIDE,ZESTORETIC) 20-25 MG tablet TAKE 1 TABLET EVERY DAY 90 tablet 1  . Multiple Vitamins-Minerals (CENTRUM SILVER 50+MEN PO) Take 1 tablet by mouth daily.     No current facility-administered medications for this visit.         Past Medical History:  Diagnosis Date  . CVA (cerebral infarction) 2005  . ED (erectile dysfunction)   . Hematuria   . Hyperlipidemia   . Hypertension          Past Surgical History:  Procedure Laterality Date  . clavicle surgery  1970  . EYE SURGERY Left   . left ear trauma    . varicose veins      Social History        Social History  Substance Use Topics  . Smoking status: Former Smoker    Packs/day: 0.50    Types: Cigarettes    Quit date: 06/17/2016  . Smokeless tobacco: Never Used  . Alcohol use 0.0 oz/week      Comment: on occasion  No IVDU    Family History      Family History  Problem Relation Age of Onset  . Diabetes Mother   . Heart disease Father   . Hypertension Father   . Heart disease Brother     No Known Allergies   REVIEW OF SYSTEMS (Negative unless checked)  Constitutional: [] Weight loss  [] Fever  [] Chills Cardiac: [] Chest pain   [] Chest pressure   [] Palpitations   [] Shortness of breath when laying flat   [] Shortness of breath at rest   [] Shortness of breath with exertion. Vascular:  [] Pain in legs with walking   [] Pain in legs at rest   [] Pain in legs when laying flat   [] Claudication   [] Pain in feet when walking  [] Pain in feet at rest  [] Pain in feet when laying flat   [] History of DVT   [] Phlebitis   [] Swelling in legs   [] Varicose veins   [] Non-healing ulcers Pulmonary:   [] Uses home oxygen   [] Productive cough   [] Hemoptysis   [] Wheeze  [] COPD   [] Asthma Neurologic:  [x] Dizziness  [] Blackouts   [] Seizures   [x] History of stroke   [x] History of TIA  [] Aphasia   [] Temporary blindness   [] Dysphagia   [] Weakness or numbness in arms   [] Weakness or  numbness in legs Musculoskeletal:  [] Arthritis   [] Joint swelling   [] Joint pain   [] Low back pain Hematologic:  [] Easy bruising  [] Easy bleeding   [] Hypercoagulable state   [] Anemic  [] Hepatitis Gastrointestinal:  [] Blood in stool   [] Vomiting blood  [] Gastroesophageal reflux/heartburn   [] Difficulty swallowing. Genitourinary:  [] Chronic kidney disease   [] Difficult urination  [] Frequent urination  [] Burning with urination   [] Blood in urine Skin:  [] Rashes   [] Ulcers   [] Wounds Psychological:  [] History of anxiety   []  History of major depression.   Physical Examination  BP 121/69   Pulse 61   Resp 16   Wt 68.5 kg (151 lb)   BMI 20.77 kg/m  Gen:  WD/WN, NAD. Appears older than stated age Head: [place]/AT, No temporalis wasting. Ear/Nose/Throat: Hearing grossly intact, nares w/o erythema or drainage, trachea midline Eyes: Conjunctiva clear. Sclera  non-icteric Neck: Supple.  No JVD.  Pulmonary:  Good air movement, no use of accessory muscles.  Cardiac: RRR, normal S1, S2 Vascular:  Vessel Right Left  Radial Palpable Palpable                                   Gastrointestinal: soft, non-tender/non-distended. No guarding/reflex.  Musculoskeletal: M/S 5/5 throughout.  No deformity or atrophy.  Neurologic: Sensation grossly intact in extremities.  Symmetrical.  Speech is fluent.  Psychiatric: Judgment intact, Mood & affect appropriate for pt's clinical situation. Dermatologic: No rashes or ulcers noted.  No cellulitis or open wounds.       Labs Recent Results (from the past 2160 hour(s))  Comprehensive metabolic panel     Status: Abnormal   Collection Time: 08/08/16  1:55 PM  Result Value Ref Range   Glucose 82 65 - 99 mg/dL   BUN 27 8 - 27 mg/dL   Creatinine, Ser 4.09 (H) 0.76 - 1.27 mg/dL   GFR calc non Af Amer 52 (L) >59 mL/min/1.73   GFR calc Af Amer 61 >59 mL/min/1.73   BUN/Creatinine Ratio 19 10 - 24   Sodium 135 134 - 144 mmol/L   Potassium 4.5 3.5 - 5.2 mmol/L   Chloride 96 96 - 106 mmol/L   CO2 22 18 - 29 mmol/L   Calcium 8.7 8.6 - 10.2 mg/dL   Total Protein 7.4 6.0 - 8.5 g/dL   Albumin 4.4 3.6 - 4.8 g/dL   Globulin, Total 3.0 1.5 - 4.5 g/dL   Albumin/Globulin Ratio 1.5 1.2 - 2.2   Bilirubin Total 0.7 0.0 - 1.2 mg/dL   Alkaline Phosphatase 72 39 - 117 IU/L   AST 17 0 - 40 IU/L   ALT 13 0 - 44 IU/L  CBC with Differential/Platelet     Status: Abnormal   Collection Time: 08/08/16  1:55 PM  Result Value Ref Range   WBC 8.9 3.4 - 10.8 x10E3/uL   RBC 4.09 (L) 4.14 - 5.80 x10E6/uL   Hemoglobin 11.8 (L) 13.0 - 17.7 g/dL   Hematocrit 81.1 (L) 91.4 - 51.0 %   MCV 87 79 - 97 fL   MCH 28.9 26.6 - 33.0 pg   MCHC 33.3 31.5 - 35.7 g/dL   RDW 78.2 95.6 - 21.3 %   Platelets 233 150 - 379 x10E3/uL   Neutrophils 67 Not Estab. %   Lymphs 27 Not Estab. %   Monocytes 5 Not Estab. %   Eos 1 Not Estab. %   Basos  0 Not  Estab. %   Neutrophils Absolute 5.9 1.4 - 7.0 x10E3/uL   Lymphocytes Absolute 2.4 0.7 - 3.1 x10E3/uL   Monocytes Absolute 0.4 0.1 - 0.9 x10E3/uL   EOS (ABSOLUTE) 0.1 0.0 - 0.4 x10E3/uL   Basophils Absolute 0.0 0.0 - 0.2 x10E3/uL   Immature Granulocytes 0 Not Estab. %   Immature Grans (Abs) 0.0 0.0 - 0.1 x10E3/uL  Lipid Panel w/o Chol/HDL Ratio     Status: None   Collection Time: 08/08/16  1:55 PM  Result Value Ref Range   Cholesterol, Total 144 100 - 199 mg/dL   Triglycerides 83 0 - 149 mg/dL   HDL 57 >40 mg/dL   VLDL Cholesterol Cal 17 5 - 40 mg/dL   LDL Calculated 70 0 - 99 mg/dL  PSA     Status: None   Collection Time: 08/08/16  1:55 PM  Result Value Ref Range   Prostate Specific Ag, Serum 1.7 0.0 - 4.0 ng/mL    Comment: Roche ECLIA methodology. According to the American Urological Association, Serum PSA should decrease and remain at undetectable levels after radical prostatectomy. The AUA defines biochemical recurrence as an initial PSA value 0.2 ng/mL or greater followed by a subsequent confirmatory PSA value 0.2 ng/mL or greater. Values obtained with different assay methods or kits cannot be used interchangeably. Results cannot be interpreted as absolute evidence of the presence or absence of malignant disease.   VAS US CAROTID     Status: None (In process)   Collection Time: 09/08/16 10:58 AM  Result Value Ref Range   Right CCA prox sys 79 cm/s   Right CCA prox dias 15 cm/s   Right cca dist sys -103 cm/s   Left CCA prox sys 42 cm/s   Left CCA prox dias 11 cm/s   Left CCA dist sys 136 cm/s   Left CCA dist dias 31 cm/s   Left ICA prox sys -36 cm/s   Left ICA prox dias -4 cm/s   Left ICA dist sys -13 cm/s   Left ICA dist dias 0 cm/s   RIGHT CCA MID DIAS 27.00 cm/s   RIGHT ECA DIAS -42.00 cm/s   RIGHT VERTEBRAL DIAS 29.00 cm/s   LEFT ECA DIAS -37.00 cm/s   LEFT VERTEBRAL DIAS 19.00 cm/s    Radiology No results found.    Assessment/Plan Chronic kidney  disease, stage 3 We discussed that we could perform an angiogram to evaluate his left carotid likely occlusion, but given his chronic kidney disease this probably not prudent as I do believe it is chronically occluded. We'll try to avoid contrast used for evaluation unless stenosis becomes very severe.  Essential (primary) hypertension blood pressure control important in reducing the progression of atherosclerotic disease. On appropriate oral medications.   Hyperlipidemia, unspecified lipid control important in reducing the progression of atherosclerotic disease. Continue statin therapy  Bilateral carotid artery disease (HCC) His carotid duplex today shows stable stenosis in the 40-59% range on the right. The left carotid artery either has a near occlusion or an occlusion, and based off of his previous studies demonstrating a occlusion and review of the images I believe this is chronically occluded. I discussed that the only way to be sure that this is occluded would be with an angiogram, and given his stage III chronic kidney disease I would like to avoid contrast unless absolutely necessary. Given the previous studies, I think assuming this is occluded is reasonable. The patient does not want to have an angiogram and I  would agree with him at this point. Plan to recheck his carotids with duplex in 6 months.    Festus Barren, MD  09/08/2016 11:56 AM    This note was created with Dragon medical transcription system.  Any errors from dictation are purely unintentional

## 2016-09-08 NOTE — Assessment & Plan Note (Signed)
His carotid duplex today shows stable stenosis in the 40-59% range on the right. The left carotid artery either has a near occlusion or an occlusion, and based off of his previous studies demonstrating a occlusion and review of the images I believe this is chronically occluded. I discussed that the only way to be sure that this is occluded would be with an angiogram, and given his stage III chronic kidney disease I would like to avoid contrast unless absolutely necessary. Given the previous studies, I think assuming this is occluded is reasonable. The patient does not want to have an angiogram and I would agree with him at this point. Plan to recheck his carotids with duplex in 6 months.

## 2016-09-19 ENCOUNTER — Ambulatory Visit: Payer: Medicare HMO | Admitting: Unknown Physician Specialty

## 2016-12-06 ENCOUNTER — Ambulatory Visit (INDEPENDENT_AMBULATORY_CARE_PROVIDER_SITE_OTHER): Payer: Medicare HMO

## 2016-12-06 DIAGNOSIS — Z23 Encounter for immunization: Secondary | ICD-10-CM

## 2016-12-12 ENCOUNTER — Other Ambulatory Visit: Payer: Self-pay | Admitting: Family Medicine

## 2017-01-24 ENCOUNTER — Other Ambulatory Visit: Payer: Self-pay | Admitting: Unknown Physician Specialty

## 2017-03-13 ENCOUNTER — Ambulatory Visit (INDEPENDENT_AMBULATORY_CARE_PROVIDER_SITE_OTHER): Payer: Medicare HMO | Admitting: Vascular Surgery

## 2017-03-13 ENCOUNTER — Encounter (INDEPENDENT_AMBULATORY_CARE_PROVIDER_SITE_OTHER): Payer: Medicare HMO

## 2017-03-30 ENCOUNTER — Ambulatory Visit: Payer: Commercial Managed Care - HMO

## 2017-04-12 ENCOUNTER — Ambulatory Visit: Payer: Medicare HMO | Admitting: Urology

## 2017-04-12 ENCOUNTER — Other Ambulatory Visit: Payer: Self-pay | Admitting: Urology

## 2017-04-12 DIAGNOSIS — N2889 Other specified disorders of kidney and ureter: Secondary | ICD-10-CM

## 2017-04-18 ENCOUNTER — Ambulatory Visit
Admission: RE | Admit: 2017-04-18 | Discharge: 2017-04-18 | Disposition: A | Discharge status: Home / Self Care | Payer: Medicare HMO | Source: Ambulatory Visit | Attending: Urology | Admitting: Urology

## 2017-04-18 DIAGNOSIS — N261 Atrophy of kidney (terminal): Secondary | ICD-10-CM | POA: Diagnosis not present

## 2017-04-18 DIAGNOSIS — N281 Cyst of kidney, acquired: Secondary | ICD-10-CM | POA: Diagnosis not present

## 2017-04-18 DIAGNOSIS — N2889 Other specified disorders of kidney and ureter: Secondary | ICD-10-CM | POA: Diagnosis not present

## 2017-04-18 LAB — POCT I-STAT CREATININE: CREATININE: 1.5 mg/dL — AB (ref 0.61–1.24)

## 2017-04-18 MED ORDER — GADOBENATE DIMEGLUMINE 529 MG/ML IV SOLN
14.0000 mL | Freq: Once | INTRAVENOUS | Status: AC | PRN
  Administered 2017-04-18: 14 mL via INTRAVENOUS

## 2017-04-19 ENCOUNTER — Ambulatory Visit (INDEPENDENT_AMBULATORY_CARE_PROVIDER_SITE_OTHER): Payer: Medicare HMO | Admitting: Urology

## 2017-04-19 ENCOUNTER — Encounter: Payer: Self-pay | Admitting: Urology

## 2017-04-19 VITALS — BP 89/56 | HR 94 | Ht 73.5 in | Wt 156.9 lb

## 2017-04-19 DIAGNOSIS — R3129 Other microscopic hematuria: Secondary | ICD-10-CM | POA: Diagnosis not present

## 2017-04-19 DIAGNOSIS — N261 Atrophy of kidney (terminal): Secondary | ICD-10-CM

## 2017-04-19 LAB — URINALYSIS, COMPLETE
BILIRUBIN UA: NEGATIVE
KETONES UA: NEGATIVE
Leukocytes, UA: NEGATIVE
Nitrite, UA: NEGATIVE
Protein, UA: NEGATIVE
Specific Gravity, UA: 1.02 (ref 1.005–1.030)
UUROB: 0.2 mg/dL (ref 0.2–1.0)
pH, UA: 6 (ref 5.0–7.5)

## 2017-04-19 LAB — MICROSCOPIC EXAMINATION
Bacteria, UA: NONE SEEN
EPITHELIAL CELLS (NON RENAL): NONE SEEN /HPF (ref 0–10)
WBC UA: NONE SEEN /HPF (ref 0–?)

## 2017-04-19 NOTE — Progress Notes (Signed)
04/19/2017 2:38 PM   Aaron Boyd 08-Dec-1950 [id number]  Referring provider: Gabriel CirriWicker, Cheryl, NP [address] [place], [place] [id number]  Chief Complaint  Patient presents with  . Follow-up    MRI results    HPI: The patient is a 67 year old gentleman who presents today for yearly follow-up of right renal lesion as well as microscopic hematuria.  He underwent a microscopic hematuria workup in December 2017.  CT was positive for bladder wall thickening and moderate right  renal atrophy with an indeterminate lesion of the right kidney for which follow-up imaging was suggested.  She returns today after undergoing an MRI of his abdomen which did show again is small atrophic right kidney with multiple benign renal cysts.  No lesions concerning for malignancy were seen.  Over the last year, he notes no urological complaints.  He denies any gross hematuria.  He continues to have microscopic hematuria with 3-10 red blood cells per high power field today.  PMH: Past Medical History:  Diagnosis Date  . CVA (cerebral infarction) 2005  . ED (erectile dysfunction)   . Hematuria   . Hyperlipidemia   . Hypertension     Surgical History: Past Surgical History:  Procedure Laterality Date  . clavicle surgery  1970  . EYE SURGERY Left   . left ear trauma    . varicose veins      Home Medications:  Allergies as of 04/19/2017   No Known Allergies     Medication List        Accurate as of 04/19/17  2:38 PM. Always use your most recent med list.          acetaminophen 325 MG tablet Commonly known as:  TYLENOL Take 650 mg by mouth every 6 (six) hours as needed.   amLODipine 5 MG tablet Commonly known as:  NORVASC TAKE 1 TABLET EVERY DAY   aspirin 325 MG tablet Take 325 mg by mouth daily.   atorvastatin 20 MG tablet Commonly known as:  LIPITOR TAKE 1 TABLET EVERY DAY   CENTRUM SILVER 50+MEN PO Take 1 tablet by mouth daily.   lisinopril-hydrochlorothiazide 20-25 MG  tablet Commonly known as:  PRINZIDE,ZESTORETIC TAKE 1 TABLET EVERY DAY       Allergies: No Known Allergies  Family History: Family History  Problem Relation Age of Onset  . Diabetes Mother   . Heart disease Father   . Hypertension Father   . Heart disease Brother     Social History:  reports that he quit smoking about 10 months ago. His smoking use included cigarettes. He smoked 0.50 packs per day. he has never used smokeless tobacco. He reports that he drinks alcohol. He reports that he does not use drugs.  ROS: UROLOGY Frequent Urination?: No Hard to postpone urination?: No Burning/pain with urination?: No Get up at night to urinate?: Yes Leakage of urine?: No Urine stream starts and stops?: No Trouble starting stream?: No Do you have to strain to urinate?: No Blood in urine?: No Urinary tract infection?: No Sexually transmitted disease?: No Injury to kidneys or bladder?: No Painful intercourse?: No Weak stream?: Yes Erection problems?: Yes Penile pain?: No  Gastrointestinal Nausea?: No Vomiting?: No Indigestion/heartburn?: No Diarrhea?: No Constipation?: No  Constitutional Fever: No Night sweats?: No Weight loss?: No Fatigue?: No  Skin Skin rash/lesions?: No Itching?: No  Eyes Blurred vision?: No Double vision?: No  Ears/Nose/Throat Sore throat?: No Sinus problems?: No  Hematologic/Lymphatic Swollen glands?: No Easy bruising?: Yes  Cardiovascular Leg  swelling?: No Chest pain?: No  Respiratory Cough?: No Shortness of breath?: No  Endocrine Excessive thirst?: No  Musculoskeletal Back pain?: No Joint pain?: No  Neurological Headaches?: No Dizziness?: No  Psychologic Depression?: No Anxiety?: No  Physical Exam: BP (!) 89/56 (BP Location: Right Arm, Patient Position: Sitting, Cuff Size: Normal)   Pulse 94   Ht 6' 1.5" (1.867 m)   Wt 156 lb 14.4 oz (71.2 kg)   BMI 20.42 kg/m   Constitutional:  Alert and oriented, No acute  distress. HEENT: [place] AT, moist mucus membranes.  Trachea midline, no masses. Cardiovascular: No clubbing, cyanosis, or edema. Respiratory: Normal respiratory effort, no increased work of breathing. GI: Abdomen is soft, nontender, nondistended, no abdominal masses GU: No CVA tenderness.  Skin: No rashes, bruises or suspicious lesions. Lymph: No cervical or inguinal adenopathy. Neurologic: Grossly intact, no focal deficits, moving all 4 extremities. Psychiatric: Normal mood and affect.  Laboratory Data: Lab Results  Component Value Date   WBC 8.9 08/08/2016   HGB 11.8 (L) 08/08/2016   HCT 35.4 (L) 08/08/2016   MCV 87 08/08/2016   PLT 233 08/08/2016    Lab Results  Component Value Date   CREATININE 1.50 (H) 04/18/2017    No results found for: PSA  No results found for: TESTOSTERONE  No results found for: HGBA1C  Urinalysis    Component Value Date/Time   APPEARANCEUR Clear 03/31/2016 1504   GLUCOSEU Negative 03/31/2016 1504   BILIRUBINUR Negative 03/31/2016 1504   PROTEINUR Negative 03/31/2016 1504   NITRITE Negative 03/31/2016 1504   LEUKOCYTESUR Negative 03/31/2016 1504   Relevant imaging: MRI reviewed showing right renal atrophy with no sign of worrisome renal lesions.  Assessment & Plan:    1. Microscopic hematuria -Negative work up one year ago. Will need repeat urinalysis in one year.  If he continues to have persistent microscopic hematuria for the next 2-4 years that we will consider repeat CT hematuria with cystoscopy per AUA guidelines.  2.  Right renal atrophy I discussed the patient that his follow-up MRI shows no lesions concerning for malignancy.  He needs no further imaging of his atrophic kidney.  Return in about 1 year (around 04/19/2018) for repeat urinalysis.  Hildred Laser, MD  [facility] [address], [address] [place], [place] [address] [phone]

## 2017-04-20 ENCOUNTER — Ambulatory Visit (INDEPENDENT_AMBULATORY_CARE_PROVIDER_SITE_OTHER): Payer: Medicare HMO | Admitting: Vascular Surgery

## 2017-04-20 ENCOUNTER — Ambulatory Visit (INDEPENDENT_AMBULATORY_CARE_PROVIDER_SITE_OTHER): Payer: Medicare HMO

## 2017-04-20 DIAGNOSIS — I779 Disorder of arteries and arterioles, unspecified: Secondary | ICD-10-CM

## 2017-04-20 DIAGNOSIS — I739 Peripheral vascular disease, unspecified: Principal | ICD-10-CM

## 2017-04-30 ENCOUNTER — Telehealth (INDEPENDENT_AMBULATORY_CARE_PROVIDER_SITE_OTHER): Payer: Self-pay | Admitting: Vascular Surgery

## 2017-04-30 NOTE — Telephone Encounter (Signed)
New Message  Pt verbalized he had U/S on 1.18.19 @ 1 pm and no one has reached out to him in regards to his results and he is calling to follow up due to the office advised him someone would call.  Please f/u

## 2017-05-01 ENCOUNTER — Other Ambulatory Visit (INDEPENDENT_AMBULATORY_CARE_PROVIDER_SITE_OTHER): Payer: Self-pay | Admitting: Vascular Surgery

## 2017-05-01 ENCOUNTER — Encounter (INDEPENDENT_AMBULATORY_CARE_PROVIDER_SITE_OTHER): Payer: Self-pay

## 2017-05-01 ENCOUNTER — Telehealth (INDEPENDENT_AMBULATORY_CARE_PROVIDER_SITE_OTHER): Payer: Self-pay | Admitting: Vascular Surgery

## 2017-05-01 DIAGNOSIS — I6523 Occlusion and stenosis of bilateral carotid arteries: Secondary | ICD-10-CM

## 2017-05-01 NOTE — Telephone Encounter (Signed)
Placed a call to the patient to discuss his recent April 20, 2017 carotid duplex.  The patient has unknown left ICA occlusion.  The patient was found to have a progression of disease to the right internal carotid artery.  Stenosis nearing 75-80%.  Due to the occlusion on the left and worsening stenosis on the right I will order a CTA of the neck to assess the patient's anatomy and exact degree of stenosis in preparation for repair.  The patient expresses his understanding.

## 2017-05-11 NOTE — Telephone Encounter (Signed)
Raul DelKim Stegmayer PA called the patient on 04/21/17 to discuss his US and the patient expressed his understanding. There is a note in the system from the PA regarding this.

## 2017-07-06 ENCOUNTER — Ambulatory Visit: Admission: RE | Admit: 2017-07-06 | Payer: Medicare HMO | Source: Ambulatory Visit

## 2017-07-17 ENCOUNTER — Other Ambulatory Visit: Payer: Self-pay | Admitting: Family Medicine

## 2017-07-17 ENCOUNTER — Ambulatory Visit (INDEPENDENT_AMBULATORY_CARE_PROVIDER_SITE_OTHER): Payer: Medicare HMO | Admitting: Vascular Surgery

## 2017-07-17 ENCOUNTER — Encounter (INDEPENDENT_AMBULATORY_CARE_PROVIDER_SITE_OTHER): Payer: Self-pay

## 2017-07-23 ENCOUNTER — Ambulatory Visit
Admission: RE | Admit: 2017-07-23 | Discharge: 2017-07-23 | Disposition: A | Discharge status: Home / Self Care | Payer: Medicare HMO | Source: Ambulatory Visit | Attending: Vascular Surgery | Admitting: Vascular Surgery

## 2017-07-23 DIAGNOSIS — I6503 Occlusion and stenosis of bilateral vertebral arteries: Secondary | ICD-10-CM | POA: Diagnosis not present

## 2017-07-23 DIAGNOSIS — I6523 Occlusion and stenosis of bilateral carotid arteries: Secondary | ICD-10-CM

## 2017-07-23 LAB — POCT I-STAT CREATININE: Creatinine, Ser: 1.6 mg/dL — ABNORMAL HIGH (ref 0.61–1.24)

## 2017-07-23 MED ORDER — IOHEXOL 350 MG/ML SOLN
60.0000 mL | Freq: Once | INTRAVENOUS | Status: AC | PRN
  Administered 2017-07-23: 60 mL via INTRAVENOUS

## 2017-07-23 MED ORDER — IOHEXOL 350 MG/ML SOLN: 75.0000 mL | Freq: Once | INTRAVENOUS | Status: DC | PRN

## 2017-07-27 ENCOUNTER — Ambulatory Visit (INDEPENDENT_AMBULATORY_CARE_PROVIDER_SITE_OTHER): Payer: Medicare HMO | Admitting: Vascular Surgery

## 2017-07-27 ENCOUNTER — Encounter (INDEPENDENT_AMBULATORY_CARE_PROVIDER_SITE_OTHER): Payer: Self-pay | Admitting: Vascular Surgery

## 2017-07-27 VITALS — BP 145/73 | HR 77 | Resp 14 | Ht 72.0 in | Wt 160.0 lb

## 2017-07-27 DIAGNOSIS — N183 Chronic kidney disease, stage 3 unspecified: Secondary | ICD-10-CM

## 2017-07-27 DIAGNOSIS — I10 Essential (primary) hypertension: Secondary | ICD-10-CM | POA: Diagnosis not present

## 2017-07-27 DIAGNOSIS — E782 Mixed hyperlipidemia: Secondary | ICD-10-CM

## 2017-07-27 DIAGNOSIS — I6523 Occlusion and stenosis of bilateral carotid arteries: Secondary | ICD-10-CM | POA: Diagnosis not present

## 2017-07-27 NOTE — Progress Notes (Signed)
MRN : [id number]  Aaron Boyd is a 67 y.o. (1950-09-09) male who presents with chief complaint of  Chief Complaint  Patient presents with  . Follow-up    CT results  .  History of Present Illness: Patient returns today in follow up of his carotid disease.  He strained his back yesterday and is having a fair bit of discomfort secondary to that.  No focal neurologic deficits. Specifically, the patient denies amaurosis fugax, speech or swallowing difficulties, or arm or leg weakness or numbness.  I have independently reviewed his CT angiogram of the carotids.  This demonstrates a chronic left carotid artery occlusion and a mild stenosis of the right internal carotid artery.  The official report because there is less than 20%.  Although I think that is somewhat of an exaggeration, I would describe this as less than 40%.  Current Outpatient Medications  Medication Sig Dispense Refill  . acetaminophen (TYLENOL) 325 MG tablet Take 650 mg by mouth every 6 (six) hours as needed.    Marland Kitchen amLODipine (NORVASC) 5 MG tablet TAKE 1 TABLET EVERY DAY 90 tablet 1  . aspirin 325 MG tablet Take 325 mg by mouth daily.    Marland Kitchen atorvastatin (LIPITOR) 20 MG tablet TAKE 1 TABLET EVERY DAY 90 tablet 1  . lisinopril-hydrochlorothiazide (PRINZIDE,ZESTORETIC) 20-25 MG tablet TAKE 1 TABLET EVERY DAY 90 tablet 1  . Multiple Vitamins-Minerals (CENTRUM SILVER 50+MEN PO) Take 1 tablet by mouth daily.     No current facility-administered medications for this visit.     Past Medical History:  Diagnosis Date  . CVA (cerebral infarction) 2005  . ED (erectile dysfunction)   . Hematuria   . Hyperlipidemia   . Hypertension     Past Surgical History:  Procedure Laterality Date  . clavicle surgery  1970  . EYE SURGERY Left   . left ear trauma    . varicose veins     Social History        Social History  Substance Use Topics  . Smoking status: Former Smoker    Packs/day: 0.50    Types: Cigarettes     Quit date: 06/17/2016  . Smokeless tobacco: Never Used  . Alcohol use 0.0 oz/week      Comment: on occasion  No IVDU  Family History      Family History  Problem Relation Age of Onset  . Diabetes Mother   . Heart disease Father   . Hypertension Father   . Heart disease Brother     No Known Allergies   REVIEW OF SYSTEMS(Negative unless checked)  Constitutional: [] Weight loss[] Fever[] Chills Cardiac:[] Chest pain[] Chest pressure[] Palpitations [] Shortness of breath when laying flat [] Shortness of breath at rest [] Shortness of breath with exertion. Vascular: [] Pain in legs with walking[] Pain in legsat rest[] Pain in legs when laying flat [] Claudication [] Pain in feet when walking [] Pain in feet at rest [] Pain in feet when laying flat [] History of DVT [] Phlebitis [] Swelling in legs [] Varicose veins [] Non-healing ulcers Pulmonary: [] Uses home oxygen [] Productive cough[] Hemoptysis [] Wheeze [] COPD [] Asthma Neurologic: [x] Dizziness [] Blackouts [] Seizures [x] History of stroke [x] History of TIA[] Aphasia [] Temporary blindness[] Dysphagia [] Weaknessor numbness in arms [] Weakness or numbnessin legs Musculoskeletal: [] Arthritis [] Joint swelling [] Joint pain [] Low back pain Hematologic:[] Easy bruising[] Easy bleeding [] Hypercoagulable state [] Anemic [] Hepatitis Gastrointestinal:[] Blood in stool[] Vomiting blood[] Gastroesophageal reflux/heartburn[] Difficulty swallowing. Genitourinary: [] Chronic kidney disease [] Difficulturination [] Frequenturination [] Burning with urination[] Blood in urine Skin: [] Rashes [] Ulcers [] Wounds Psychological: [] History of anxiety[] History of major depression.   Physical Examination  BP (!) 145/73 (BP Location: Right Arm, Patient Position: Sitting)  Pulse 77   Resp 14   Ht 6' (1.829 m)   Wt 72.6 kg (160 lb)   BMI 21.70 kg/m  Gen:   Thin, NAD Head: [place]/AT, No temporalis wasting. Ear/Nose/Throat: Hearing grossly intact, nares w/o erythema or drainage Eyes: Conjunctiva clear. Sclera non-icteric Neck: Supple.  Trachea midline Pulmonary:  Good air movement, no use of accessory muscles.  Cardiac: RRR, no JVD Vascular:  Vessel Right Left  Radial Palpable Palpable                                    Musculoskeletal: M/S 5/5 throughout. Walking with a limp today Neurologic: Sensation grossly intact in extremities.  Symmetrical.  Speech is fluent.  Psychiatric: Judgment intact, Mood & affect appropriate for pt's clinical situation. Dermatologic: No rashes or ulcers noted.  No cellulitis or open wounds.       Labs Recent Results (from the past 2160 hour(s))  I-STAT creatinine     Status: Abnormal   Collection Time: 07/23/17  3:29 PM  Result Value Ref Range   Creatinine, Ser 1.60 (H) 0.61 - 1.24 mg/dL    Radiology Ct Angio Neck W Or Wo Contrast  Result Date: 07/24/2017 CLINICAL DATA:  Carotid stenosis. EXAM: CT ANGIOGRAPHY NECK TECHNIQUE: Multidetector CT imaging of the neck was performed using the standard protocol during bolus administration of intravenous contrast. Multiplanar CT image reconstructions and MIPs were obtained to evaluate the vascular anatomy. Carotid stenosis measurements (when applicable) are obtained utilizing NASCET criteria, using the distal internal carotid diameter as the denominator. CONTRAST:  60mL OMNIPAQUE IOHEXOL 350 MG/ML SOLN COMPARISON:  Carotid Doppler 03/08/2015 FINDINGS: Aortic arch: Extensive atherosclerotic disease aortic arch and proximal great vessels. No significant stenosis in the great vessels. Right carotid system: Extensive atherosclerotic plaque throughout the right common carotid artery without significant stenosis. Calcified and noncalcified plaque at the right carotid bifurcation. Right internal carotid artery is mildly narrowed with lumen diameter 5.4 mm. Less than 20%  diameter stenosis right internal carotid artery. Atherosclerotic disease in the right cavernous carotid. Mild stenosis proximal right external carotid artery Left carotid system: Diffuse atherosclerotic disease throughout the left common carotid artery which has a small lumen due to atherosclerotic disease and occlusion of the left internal carotid artery which is chronic and unchanged from the prior Doppler 2016. Moderate stenosis origin of left external carotid artery. Left internal carotid artery occluded through the cavernous segment. Vertebral arteries: Mild stenosis at the origin of the vertebral artery bilaterally. Both vertebral arteries are patent to the basilar. No significant additional stenosis. Skeleton: Cervical disc degeneration and spondylosis most prominent C5-6 and C6-7. No acute skeletal abnormality. Poor dentition. Other neck: Negative for mass or adenopathy Upper chest: Apical emphysema with apical pleural and parenchymal scarring bilaterally. IMPRESSION: Advanced diffuse atherosclerotic disease Less than 20% stenosis right internal carotid artery due to atherosclerotic calcified and noncalcified plaque. Mild stenosis proximal right external carotid artery Chronic occlusion left internal carotid artery at the origin. Moderate stenosis origin of left external carotid artery Mild stenosis origin of the vertebral artery bilaterally. Electronically Signed   By: Marlan Palau M.D.   On: 07/24/2017 07:58    Assessment/Plan Essential (primary) hypertension blood pressure control important in reducing the progression of atherosclerotic disease. On appropriate oral medications.   Hyperlipidemia, unspecified lipid control important in reducing the progression of atherosclerotic disease. Continue statin therapy   Bilateral carotid artery disease (HCC) I have  independently reviewed his CT angiogram of the carotids.  This demonstrates a chronic left carotid artery occlusion and a mild  stenosis of the right internal carotid artery.  The official report because there is less than 20%.  Although I think that is somewhat of an exaggeration, I would describe this as less than 40%. We discussed today that this was encouraging.  I would recommend he continue taking his aspirin daily as well as his Lipitor.  We will recheck this again in 1 year.  I will try to perform duplex and hopefully we can get more accurate readings than his most recent duplex which had suggested a significant worsening.    Festus BarrenJason Felesia Stahlecker, MD  07/27/2017 9:35 AM    This note was created with Dragon medical transcription system.  Any errors from dictation are purely unintentional

## 2017-07-27 NOTE — Assessment & Plan Note (Signed)
I have independently reviewed his CT angiogram of the carotids.  This demonstrates a chronic left carotid artery occlusion and a mild stenosis of the right internal carotid artery.  The official report because there is less than 20%.  Although I think that is somewhat of an exaggeration, I would describe this as less than 40%. We discussed today that this was encouraging.  I would recommend he continue taking his aspirin daily as well as his Lipitor.  We will recheck this again in 1 year.  I will try to perform duplex and hopefully we can get more accurate readings than his most recent duplex which had suggested a significant worsening.

## 2017-07-27 NOTE — Patient Instructions (Signed)
Carotid Artery Disease The carotid arteries are arteries on both sides of the neck. They carry blood to the brain. Carotid artery disease is when the arteries get smaller (narrow) or get blocked. If these arteries get smaller or get blocked, you are more likely to have a stroke or warning stroke (transient ischemic attack). Follow these instructions at home:  Take medicines as told by your doctor. Make sure you understand all your medicine instructions. Do not stop your medicines without talking to your doctor first.  Follow your doctor's diet instructions. It is important to eat a healthy diet that includes plenty of: ? Fresh fruits. ? Vegetables. ? Lean meats.  Avoid: ? High-fat foods. ? High-sodium foods. ? Foods that are fried, overly processed, or have poor nutritional value.  Stay a healthy weight.  Stay active. Get at least 30 minutes of activity every day.  Do not smoke.  Limit alcohol use to: ? No more than 2 drinks a day for men. ? No more than 1 drink a day for women who are not pregnant.  Do not use illegal drugs.  Keep all doctor visits as told. Get help right away if:  You have sudden weakness or loss of feeling (numbness) on one side of the body, such as the face, arm, or leg.  You have sudden confusion.  You have trouble speaking (aphasia) or understanding.  You have sudden trouble seeing out of one or both eyes.  You have sudden trouble walking.  You have dizziness or feel like you might pass out (faint).  You have a loss of balance or your movements are not steady (uncoordinated).  You have a sudden, severe headache with no known cause.  You have trouble swallowing (dysphagia). Call your local emergency services (911 in [place] Do notdrive yourself to the clinic or hospital. This information is not intended to replace advice given to you by your health care provider. Make sure you discuss any questions you have with your health care  provider. Document Released: 03/06/2012 Document Revised: 08/26/2015 Document Reviewed: 09/18/2012 Elsevier Interactive Patient Education  2018 Elsevier Inc.  

## 2017-09-03 ENCOUNTER — Other Ambulatory Visit: Payer: Self-pay | Admitting: Unknown Physician Specialty

## 2017-09-28 ENCOUNTER — Other Ambulatory Visit: Payer: Self-pay | Admitting: Unknown Physician Specialty

## 2017-10-03 ENCOUNTER — Ambulatory Visit (INDEPENDENT_AMBULATORY_CARE_PROVIDER_SITE_OTHER): Payer: Medicare PPO

## 2017-10-03 ENCOUNTER — Telehealth: Payer: Self-pay | Admitting: *Deleted

## 2017-10-03 ENCOUNTER — Encounter: Payer: Self-pay | Admitting: *Deleted

## 2017-10-03 ENCOUNTER — Other Ambulatory Visit: Payer: Self-pay

## 2017-10-03 ENCOUNTER — Ambulatory Visit: Payer: Medicare PPO | Admitting: Unknown Physician Specialty

## 2017-10-03 ENCOUNTER — Encounter: Payer: Self-pay | Admitting: Unknown Physician Specialty

## 2017-10-03 VITALS — BP 124/76 | HR 70 | Temp 98.2°F | Ht 72.0 in | Wt 155.9 lb

## 2017-10-03 VITALS — BP 142/70 | HR 81 | Temp 98.2°F | Resp 17 | Ht 72.0 in | Wt 155.9 lb

## 2017-10-03 DIAGNOSIS — J449 Chronic obstructive pulmonary disease, unspecified: Secondary | ICD-10-CM | POA: Diagnosis not present

## 2017-10-03 DIAGNOSIS — Z122 Encounter for screening for malignant neoplasm of respiratory organs: Secondary | ICD-10-CM

## 2017-10-03 DIAGNOSIS — I10 Essential (primary) hypertension: Secondary | ICD-10-CM | POA: Diagnosis not present

## 2017-10-03 DIAGNOSIS — Z0000 Encounter for general adult medical examination without abnormal findings: Secondary | ICD-10-CM | POA: Diagnosis not present

## 2017-10-03 DIAGNOSIS — Z87891 Personal history of nicotine dependence: Secondary | ICD-10-CM

## 2017-10-03 DIAGNOSIS — Z23 Encounter for immunization: Secondary | ICD-10-CM | POA: Diagnosis not present

## 2017-10-03 DIAGNOSIS — E782 Mixed hyperlipidemia: Secondary | ICD-10-CM | POA: Diagnosis not present

## 2017-10-03 DIAGNOSIS — I6523 Occlusion and stenosis of bilateral carotid arteries: Secondary | ICD-10-CM | POA: Diagnosis not present

## 2017-10-03 DIAGNOSIS — Z1159 Encounter for screening for other viral diseases: Secondary | ICD-10-CM | POA: Diagnosis not present

## 2017-10-03 MED ORDER — ATORVASTATIN CALCIUM 20 MG PO TABS: 20.0000 mg | ORAL_TABLET | Freq: Every day | ORAL | 1 refills | Status: DC

## 2017-10-03 MED ORDER — LISINOPRIL-HYDROCHLOROTHIAZIDE 20-25 MG PO TABS: 1.0000 | ORAL_TABLET | Freq: Every day | ORAL | 1 refills | Status: DC

## 2017-10-03 MED ORDER — AMLODIPINE BESYLATE 5 MG PO TABS: 5.0000 mg | ORAL_TABLET | Freq: Every day | ORAL | 0 refills | Status: DC

## 2017-10-03 NOTE — Progress Notes (Signed)
Subjective:   Cathren LaineRobert D Blevins is a 67 y.o. male who presents for Medicare Annual/Subsequent preventive examination.  Review of Systems:   Cardiac Risk Factors include: advanced age (>3755men, 33>65 women);dyslipidemia;hypertension;male gender;smoking/ tobacco exposure     Objective:    Vitals: BP (!) 142/70 (BP Location: Left Arm, Patient Position: Sitting)   Pulse 81   Temp 98.2 F (36.8 C) (Temporal)   Resp 17   Ht 6' (1.829 m)   Wt 155 lb 14.4 oz (70.7 kg)   SpO2 98%   BMI 21.14 kg/m   Body mass index is 21.14 kg/m.  Advanced Directives 10/03/2017 09/05/2016 08/08/2016  Does Patient Have a Medical Advance Directive? Yes Yes Yes  Type of Advance Directive Living will;Healthcare Power of [employer] Healthcare Power of [place];Living will -  Does patient want to make changes to medical advance directive? - - No - Patient declined  Copy of Healthcare Power of Attorney in Chart? No - copy requested - -    Tobacco Social History   Tobacco Use  Smoking Status Former Smoker  . Packs/day: 0.50  . Types: Cigarettes  . Last attempt to quit: 06/17/2016  . Years since quitting: 1.2  Smokeless Tobacco Never Used     Counseling given: Not Answered   Clinical Intake:  Pre-visit preparation completed: Yes  Pain : No/denies pain     Nutritional Status: BMI of 19-24  Normal Nutritional Risks: None Diabetes: No  How often do you need to have someone help you when you read instructions, pamphlets, or other written materials from your doctor or pharmacy?: 1 - Never What is the last grade level you completed in school?: 12th grade  Interpreter Needed?: No  Information entered by :: Tiffany Hill,LPN   Past Medical History:  Diagnosis Date  . CVA (cerebral infarction) 2005  . ED (erectile dysfunction)   . Hematuria   . Hyperlipidemia   . Hypertension    Past Surgical History:  Procedure Laterality Date  . clavicle surgery  1970  . EYE SURGERY Left   . left ear trauma      . varicose veins     Family History  Problem Relation Age of Onset  . Diabetes Mother   . Heart disease Father   . Hypertension Father   . Heart disease Brother    Social History   Socioeconomic History  . Marital status: Married    Spouse name: Not on file  . Number of children: Not on file  . Years of education: Not on file  . Highest education level: Not on file  Occupational History  . Not on file  Social Needs  . Financial resource strain: Not hard at all  . Food insecurity:    Worry: Never true    Inability: Never true  . Transportation needs:    Medical: No    Non-medical: No  Tobacco Use  . Smoking status: Former Smoker    Packs/day: 0.50    Types: Cigarettes    Last attempt to quit: 06/17/2016    Years since quitting: 1.2  . Smokeless tobacco: Never Used  Substance and Sexual Activity  . Alcohol use: Yes    Alcohol/week: 0.0 oz    Comment: on occasion  . Drug use: No  . Sexual activity: Not on file  Lifestyle  . Physical activity:    Days per week: 0 days    Minutes per session: 0 min  . Stress: Not at all  Relationships  .  Social connections:    Talks on phone: More than three times a week    Gets together: More than three times a week    Attends religious service: Never    Active member of club or organization: No    Attends meetings of clubs or organizations: Never    Relationship status: Widowed  Other Topics Concern  . Not on file  Social History Narrative  . Not on file    Outpatient Encounter Medications as of 10/03/2017  Medication Sig  . acetaminophen (TYLENOL) 325 MG tablet Take 650 mg by mouth every 6 (six) hours as needed.  Marland Kitchen amLODipine (NORVASC) 5 MG tablet TAKE 1 TABLET EVERY DAY  . aspirin 325 MG tablet Take 325 mg by mouth daily.  Marland Kitchen atorvastatin (LIPITOR) 20 MG tablet TAKE 1 TABLET EVERY DAY  . lisinopril-hydrochlorothiazide (PRINZIDE,ZESTORETIC) 20-25 MG tablet TAKE 1 TABLET EVERY DAY  . Multiple Vitamins-Minerals (CENTRUM  SILVER 50+MEN PO) Take 1 tablet by mouth daily.   No facility-administered encounter medications on file as of 10/03/2017.     Activities of Daily Living In your present state of health, do you have any difficulty performing the following activities: 10/03/2017  Hearing? Y  Vision? N  Difficulty concentrating or making decisions? N  Walking or climbing stairs? N  Dressing or bathing? N  Doing errands, shopping? N  Preparing Food and eating ? N  Using the Toilet? N  In the past six months, have you accidently leaked urine? N  Do you have problems with loss of bowel control? N  Managing your Medications? N  Managing your Finances? N  Housekeeping or managing your Housekeeping? N  Some recent data might be hidden    Patient Care Team: Gabriel Cirri, NP as PCP - General (Nurse Practitioner)   Assessment:   This is a routine wellness examination for Braydn.  Exercise Activities and Dietary recommendations Current Exercise Habits: The patient does not participate in regular exercise at present, Exercise limited by: None identified  Goals    . Quit Smoking     tobacco use cessation discussed       Fall Risk Fall Risk  10/03/2017 08/08/2016  Falls in the past year? No No   Is the patient's home free of loose throw rugs in walkways, pet beds, electrical cords, etc?   yes      Grab bars in the bathroom? no      Handrails on the stairs?   yes      Adequate lighting?   yes  Timed Get Up and Go Performed: Completed in 8 seconds with no use of assistive devices, steady gait. No intervention needed at this time.   Depression Screen PHQ 2/9 Scores 10/03/2017 08/08/2016  PHQ - 2 Score 0 0  PHQ- 9 Score - 0    Cognitive Function     6CIT Screen 10/03/2017  What Year? 0 points  What month? 0 points  What time? 0 points  Count back from 20 0 points  Months in reverse 0 points  Repeat phrase 2 points  Total Score 2    Immunization History  Administered Date(s) Administered  .  Influenza, High Dose Seasonal PF 12/06/2016  . Influenza,inj,Quad PF,6+ Mos 01/19/2015, 01/07/2016  . Influenza-Unspecified 02/09/2014  . Pneumococcal Conjugate-13 08/08/2016  . Pneumococcal Polysaccharide-23 10/03/2017    Qualifies for Shingles Vaccine? Yes, discussed shingrix vaccine   Screening Tests Health Maintenance  Topic Date Due  . Hepatitis C Screening  28-Dec-1950  .  TETANUS/TDAP  10/04/2018 (Originally 06/19/1970)  . INFLUENZA VACCINE  11/01/2017  . COLONOSCOPY  06/01/2020  . PNA vac Low Risk Adult  Completed   Cancer Screenings: Lung: Low Dose CT Chest recommended if Age 79-80 years, 30 pack-year currently smoking OR have quit w/in 15years. Patient does qualify. Had CT scan 07/2017 Colorectal: completed 06/02/2010  Additional Screenings:  Hepatitis C Screening: will order for future labs       Plan:    I have personally reviewed and addressed the Medicare Annual Wellness questionnaire and have noted the following in the patient's chart:  A. Medical and social history B. Use of alcohol, tobacco or illicit drugs  C. Current medications and supplements D. Functional ability and status E.  Nutritional status F.  Physical activity G. Advance directives H. List of other physicians I.  Hospitalizations, surgeries, and ER visits in previous 12 months J.  Vitals K. Screenings such as hearing and vision if needed, cognitive and depression L. Referrals and appointments   In addition, I have reviewed and discussed with patient certain preventive protocols, quality metrics, and best practice recommendations. A written personalized care plan for preventive services as well as general preventive health recommendations were provided to patient.   Signed,  Marin Roberts, LPN Nurse Health Advisor   Nurse Notes:none

## 2017-10-03 NOTE — Telephone Encounter (Signed)
Received referral for initial lung cancer screening scan. Contacted patient and obtained smoking history,(former, quit 07/02/16, 33 pack year) as well as answering questions related to screening process. Patient denies signs of lung cancer such as weight loss or hemoptysis. Patient denies comorbidity that would prevent curative treatment if lung cancer were found. Patient is scheduled for shared decision making visit and CT scan on 10/16/17.

## 2017-10-03 NOTE — Assessment & Plan Note (Signed)
Recheck at 124/77. At goal.  Continue current therapy

## 2017-10-03 NOTE — Patient Instructions (Signed)
[name] , Thank you for taking time to come for your Medicare Wellness Visit. I appreciate your ongoing commitment to your health goals. Please review the following plan we discussed and let me know if I can assist you in the future.   Screening recommendations/referrals: Colonoscopy: completed 06/02/2010 Recommended yearly ophthalmology/optometry visit for glaucoma screening and checkup Recommended yearly dental visit for hygiene and checkup  Vaccinations: Influenza vaccine: due 12/2017 Pneumococcal vaccine: completed series Tdap vaccine: due, check with your insurance company for coverage  Shingles vaccine: shingrix eligible, check with your insurance company for coverage   Advanced directives: Please bring a copy of your health care power of attorney and living will to the office at your convenience.  Conditions/risks identified: tobacco use cessation discussed  Next appointment: Follow up in one year for your annual wellness exam.   Preventive Care 65 Years and Older, Male Preventive care refers to lifestyle choices and visits with your health care provider that can promote health and wellness. What does preventive care include?  A yearly physical exam. This is also called an annual well check.  Dental exams once or twice a year.  Routine eye exams. Ask your health care provider how often you should have your eyes checked.  Personal lifestyle choices, including:  Daily care of your teeth and gums.  Regular physical activity.  Eating a healthy diet.  Avoiding tobacco and drug use.  Limiting alcohol use.  Practicing safe sex.  Taking low doses of aspirin every day.  Taking vitamin and mineral supplements as recommended by your health care provider. What happens during an annual well check? The services and screenings done by your health care provider during your annual well check will depend on your age, overall health, lifestyle risk factors, and family history of  disease. Counseling  Your health care provider may ask you questions about your:  Alcohol use.  Tobacco use.  Drug use.  Emotional well-being.  Home and relationship well-being.  Sexual activity.  Eating habits.  History of falls.  Memory and ability to understand (cognition).  Work and work Astronome[employer]vironment. Screening  You may have the following tests or measurements:  Height, weight, and BMI.  Blood pressure.  Lipid and cholesterol levels. These may be checked every 5 years, or more frequently if you are over 31 years old.  Skin check.  Lung cancer screening. You may have this screening every year starting at age 66 if you have a 30-pack-year history of smoking and currently smoke or have quit within the past 15 years.  Fecal occult blood test (FOBT) of the stool. You may have this test every year starting at age 66.  Flexible sigmoidoscopy or colonoscopy. You may have a sigmoidoscopy every 5 years or a colonoscopy every 10 years starting at age 66.  Prostate cancer screening. Recommendations will vary depending on your family history and other risks.  Hepatitis C blood test.  Hepatitis B blood test.  Sexually transmitted disease (STD) testing.  Diabetes screening. This is done by checking your blood sugar (glucose) after you have not eaten for a while (fasting). You may have this done every 1-3 years.  Abdominal aortic aneurysm (AAA) screening. You may need this if you are a current or former smoker.  Osteoporosis. You may be screened starting at age 66 if you are at high risk. Talk with your health care provider about your test results, treatment options, and if necessary, the need for more tests. Vaccines  Your health care provider  may recommend certain vaccines, such as:  Influenza vaccine. This is recommended every year.  Tetanus, diphtheria, and acellular pertussis (Tdap, Td) vaccine. You may need a Td booster every 10 years.  Zoster vaccine. You may  need this after age 39.  Pneumococcal 13-valent conjugate (PCV13) vaccine. One dose is recommended after age 56.  Pneumococcal polysaccharide (PPSV23) vaccine. One dose is recommended after age 45. Talk to your health care provider about which screenings and vaccines you need and how often you need them. This information is not intended to replace advice given to you by your health care provider. Make sure you discuss any questions you have with your health care provider. Document Released: 04/16/2015 Document Revised: 12/08/2015 Document Reviewed: 01/19/2015 Elsevier Interactive Patient Education  2017 [place] Prevention in the Home Falls can cause injuries. They can happen to people of all ages. There are many things you can do to make your home safe and to help prevent falls. What can I do on the outside of my home?  Regularly fix the edges of walkways and driveways and fix any cracks.  Remove anything that might make you trip as you walk through a door, such as a raised step or threshold.  Trim any bushes or trees on the path to your home.  Use bright outdoor lighting.  Clear any walking paths of anything that might make someone trip, such as rocks or tools.  Regularly check to see if handrails are loose or broken. Make sure that both sides of any steps have handrails.  Any raised decks and porches should have guardrails on the edges.  Have any leaves, snow, or ice cleared regularly.  Use sand or salt on walking paths during winter.  Clean up any spills in your garage right away. This includes oil or grease spills. What can I do in the bathroom?  Use night lights.  Install grab bars by the toilet and in the tub and shower. Do not use towel bars as grab bars.  Use non-skid mats or decals in the tub or shower.  If you need to sit down in the shower, use a plastic, non-slip stool.  Keep the floor dry. Clean up any water that spills on the floor as soon as it  happens.  Remove soap buildup in the tub or shower regularly.  Attach bath mats securely with double-sided non-slip rug tape.  Do not have throw rugs and other things on the floor that can make you trip. What can I do in the bedroom?  Use night lights.  Make sure that you have a light by your bed that is easy to reach.  Do not use any sheets or blankets that are too big for your bed. They should not hang down onto the floor.  Have a firm chair that has side arms. You can use this for support while you get dressed.  Do not have throw rugs and other things on the floor that can make you trip. What can I do in the kitchen?  Clean up any spills right away.  Avoid walking on wet floors.  Keep items that you use a lot in easy-to-reach places.  If you need to reach something above you, use a strong step stool that has a grab bar.  Keep electrical cords out of the way.  Do not use floor polish or wax that makes floors slippery. If you must use wax, use non-skid floor wax.  Do not have throw rugs  and other things on the floor that can make you trip. What can I do with my stairs?  Do not leave any items on the stairs.  Make sure that there are handrails on both sides of the stairs and use them. Fix handrails that are broken or loose. Make sure that handrails are as long as the stairways.  Check any carpeting to make sure that it is firmly attached to the stairs. Fix any carpet that is loose or worn.  Avoid having throw rugs at the top or bottom of the stairs. If you do have throw rugs, attach them to the floor with carpet tape.  Make sure that you have a light switch at the top of the stairs and the bottom of the stairs. If you do not have them, ask someone to add them for you. What else can I do to help prevent falls?  Wear shoes that:  Do not have high heels.  Have rubber bottoms.  Are comfortable and fit you well.  Are closed at the toe. Do not wear sandals.  If you  use a stepladder:  Make sure that it is fully opened. Do not climb a closed stepladder.  Make sure that both sides of the stepladder are locked into place.  Ask someone to hold it for you, if possible.  Clearly mark and make sure that you can see:  Any grab bars or handrails.  First and last steps.  Where the edge of each step is.  Use tools that help you move around (mobility aids) if they are needed. These include:  Canes.  Walkers.  Scooters.  Crutches.  Turn on the lights when you go into a dark area. Replace any light bulbs as soon as they burn out.  Set up your furniture so you have a clear path. Avoid moving your furniture around.  If any of your floors are uneven, fix them.  If there are any pets around you, be aware of where they are.  Review your medicines with your doctor. Some medicines can make you feel dizzy. This can increase your chance of falling. Ask your doctor what other things that you can do to help prevent falls. This information is not intended to replace advice given to you by your health care provider. Make sure you discuss any questions you have with your health care provider. Document Released: 01/14/2009 Document Revised: 08/26/2015 Document Reviewed: 04/24/2014 Elsevier Interactive Patient Education  2017 [employer]  Pneumococcal Polysaccharide Vaccine: What You Need to Know 1. Why get vaccinated? Vaccination can protect older adults (and some children and younger adults) from pneumococcal disease. Pneumococcal disease is caused by bacteria that can spread from person to person through close contact. It can cause ear infections, and it can also lead to more serious infections of the:  Lungs (pneumonia),  Blood (bacteremia), and  Covering of the brain and spinal cord (meningitis). Meningitis can cause deafness and brain damage, and it can be fatal.  Anyone can get pneumococcal disease, but children under 11 years of age, people with  certain medical conditions, adults over 32 years of age, and cigarette smokers are at the highest risk. About 18,000 older adults die each year from pneumococcal disease in the [place]. Treatment of pneumococcal infections with penicillin and other drugs used to be more effective. But some strains of the disease have become resistant to these drugs. This makes prevention of the disease, through vaccination, even more important. 2. Pneumococcal polysaccharide vaccine (PPSV23)  Pneumococcal polysaccharide vaccine (PPSV23) protects against 23 types of pneumococcal bacteria. It will not prevent all pneumococcal disease. PPSV23 is recommended for:  All adults 41 years of age and older,  Anyone 2 through 66 years of age with certain long-term health problems,  Anyone 2 through 66 years of age with a weakened immune system,  Adults 56 through 66 years of age who smoke cigarettes or have asthma.  Most people need only one dose of PPSV. A second dose is recommended for certain high-risk groups. People 30 and older should get a dose even if they have gotten one or more doses of the vaccine before they turned 65. Your healthcare provider can give you more information about these recommendations. Most healthy adults develop protection within 2 to 3 weeks of getting the shot. 3. Some people should not get this vaccine  Anyone who has had a life-threatening allergic reaction to PPSV should not get another dose.  Anyone who has a severe allergy to any component of PPSV should not receive it. Tell your provider if you have any severe allergies.  Anyone who is moderately or severely ill when the shot is scheduled may be asked to wait until they recover before getting the vaccine. Someone with a mild illness can usually be vaccinated.  Children less than 16 years of age should not receive this vaccine.  There is no evidence that PPSV is harmful to either a pregnant woman or to her fetus. However, as a  precaution, women who need the vaccine should be vaccinated before becoming pregnant, if possible. 4. Risks of a vaccine reaction With any medicine, including vaccines, there is a chance of side effects. These are usually mild and go away on their own, but serious reactions are also possible. About half of people who get PPSV have mild side effects, such as redness or pain where the shot is given, which go away within about two days. Less than 1 out of 100 people develop a fever, muscle aches, or more severe local reactions. Problems that could happen after any vaccine:  People sometimes faint after a medical procedure, including vaccination. Sitting or lying down for about 15 minutes can help prevent fainting, and injuries caused by a fall. Tell your doctor if you feel dizzy, or have vision changes or ringing in the ears.  Some people get severe pain in the shoulder and have difficulty moving the arm where a shot was given. This happens very rarely.  Any medication can cause a severe allergic reaction. Such reactions from a vaccine are very rare, estimated at about 1 in a million doses, and would happen within a few minutes to a few hours after the vaccination. As with any medicine, there is a very remote chance of a vaccine causing a serious injury or death. The safety of vaccines is always being monitored. For more information, visit: http://floyd.org/ 5. What if there is a serious reaction? What should I look for? Look for anything that concerns you, such as signs of a severe allergic reaction, very high fever, or unusual behavior. Signs of a severe allergic reaction can include hives, swelling of the face and throat, difficulty breathing, a fast heartbeat, dizziness, and weakness. These would usually start a few minutes to a few hours after the vaccination. What should I do? If you think it is a severe allergic reaction or other emergency that can't wait, call 9-1-1 or get to the  nearest hospital. Otherwise, call your doctor. Afterward, the reaction  should be reported to the Vaccine Adverse Event Reporting System (VAERS). Your doctor might file this report, or you can do it yourself through the VAERS web site at www.vaers.LAgents.no, or by calling 1-651-696-6433. VAERS does not give medical advice. 6. How can I learn more?  Ask your doctor. He or she can give you the vaccine package insert or suggest other sources of information.  Call your local or state health department.  Contact the Centers for Disease Control and Prevention (CDC): ? Call 204-767-9648 (1-800-CDC-INFO) or ? Visit CDC's website at PicCapture.uy CDC Pneumococcal Polysaccharide Vaccine VIS (07/25/13) This information is not intended to replace advice given to you by your health care provider. Make sure you discuss any questions you have with your health care provider. Document Released: 01/15/2006 Document Revised: 12/09/2015 Document Reviewed: 12/09/2015 Elsevier Interactive Patient Education  2017 [employer].

## 2017-10-03 NOTE — Assessment & Plan Note (Addendum)
Mild.  SOB at times.  Does not want an inhaler at this time.  Set up for low dose CT

## 2017-10-03 NOTE — Addendum Note (Signed)
Addended by: Marin RobertsHILL, Ellajane Stong A on: 10/03/2017 09:52 AM   Modules accepted: Orders

## 2017-10-03 NOTE — Assessment & Plan Note (Signed)
Followed by vascular 

## 2017-10-03 NOTE — Progress Notes (Signed)
BP (!) 142/70   Pulse 81   Temp 98.2 F (36.8 C) (Temporal)   Ht 6' (1.829 m)   Wt 155 lb 14.4 oz (70.7 kg)   SpO2 98%   BMI 21.14 kg/m    Subjective:    Patient ID: Aaron Boyd, male    DOB: 1950-10-03, 67 y.o.   MRN: [id number]  HPI: Aaron LaineRobert D Bruso is a 67 y.o. male  Chief Complaint  Patient presents with  . Follow-up   Hypertension Using medications without difficulty Average home BPs Not checking   No problems or lightheadedness No chest pain with exertion or shortness of breath No Edema  Hyperlipidemia Using medications without problems: No Muscle aches  Diet compliance:Exercise: Helping son with building and staying active in the yard  Former smoker Quit 1.2 years ago  Social History   Socioeconomic History  . Marital status: Married    Spouse name: Not on file  . Number of children: Not on file  . Years of education: Not on file  . Highest education level: Not on file  Occupational History  . Not on file  Social Needs  . Financial resource strain: Not hard at all  . Food insecurity:    Worry: Never true    Inability: Never true  . Transportation needs:    Medical: No    Non-medical: No  Tobacco Use  . Smoking status: Former Smoker    Packs/day: 0.50    Types: Cigarettes    Last attempt to quit: 06/17/2016    Years since quitting: 1.2  . Smokeless tobacco: Never Used  Substance and Sexual Activity  . Alcohol use: Yes    Alcohol/week: 0.0 oz    Comment: on occasion  . Drug use: No  . Sexual activity: Not on file  Lifestyle  . Physical activity:    Days per week: 0 days    Minutes per session: 0 min  . Stress: Not at all  Relationships  . Social connections:    Talks on phone: More than three times a week    Gets together: More than three times a week    Attends religious service: Never    Active member of club or organization: No    Attends meetings of clubs or organizations: Never    Relationship status: Widowed  . Intimate  partner violence:    Fear of current or ex partner: No    Emotionally abused: No    Physically abused: No    Forced sexual activity: No  Other Topics Concern  . Not on file  Social History Narrative  . Not on file     Relevant past medical, surgical, family and social history reviewed and updated as indicated. Interim medical history since our last visit reviewed. Allergies and medications reviewed and updated.  Review of Systems  Per HPI unless specifically indicated above     Objective:    BP (!) 142/70   Pulse 81   Temp 98.2 F (36.8 C) (Temporal)   Ht 6' (1.829 m)   Wt 155 lb 14.4 oz (70.7 kg)   SpO2 98%   BMI 21.14 kg/m   Wt Readings from Last 3 Encounters:  10/03/17 155 lb 14.4 oz (70.7 kg)  10/03/17 155 lb 14.4 oz (70.7 kg)  07/27/17 160 lb (72.6 kg)    Physical Exam  Constitutional: He is oriented to person, place, and time. He appears well-developed and well-nourished. No distress.  HENT:  Head: Normocephalic and  atraumatic.  Eyes: Conjunctivae and lids are normal. Right eye exhibits no discharge. Left eye exhibits no discharge. No scleral icterus.  Neck: Normal range of motion. Neck supple. No JVD present. Carotid bruit is not present.  Cardiovascular: Normal rate, regular rhythm and normal heart sounds.  Pulmonary/Chest: Effort normal and breath sounds normal. No respiratory distress.  Abdominal: Normal appearance. There is no splenomegaly or hepatomegaly.  Musculoskeletal: Normal range of motion.  Neurological: He is alert and oriented to person, place, and time.  Skin: Skin is warm, dry and intact. No rash noted. No pallor.  Psychiatric: He has a normal mood and affect. His behavior is normal. Judgment and thought content normal.    Results for orders placed or performed during the hospital encounter of 07/23/17  I-STAT creatinine  Result Value Ref Range   Creatinine, Ser 1.60 (H) 0.61 - 1.24 mg/dL      Assessment & Plan:   Problem List Items  Addressed This Visit      Unprioritized   Bilateral carotid artery disease (HCC)    Followed by vascular      Relevant Medications   amLODipine (NORVASC) 5 MG tablet   atorvastatin (LIPITOR) 20 MG tablet   lisinopril-hydrochlorothiazide (PRINZIDE,ZESTORETIC) 20-25 MG tablet   Chronic obstructive pulmonary disease (HCC)    Mild.  SOB at times.  Does not want an inhaler at this time.  Set up for low dose CT      Essential (primary) hypertension    Recheck at 124/77. At goal.  Continue current therapy      Relevant Medications   amLODipine (NORVASC) 5 MG tablet   atorvastatin (LIPITOR) 20 MG tablet   lisinopril-hydrochlorothiazide (PRINZIDE,ZESTORETIC) 20-25 MG tablet   Other Relevant Orders   Comprehensive metabolic panel   Hyperlipidemia, unspecified - Primary   Relevant Medications   amLODipine (NORVASC) 5 MG tablet   atorvastatin (LIPITOR) 20 MG tablet   lisinopril-hydrochlorothiazide (PRINZIDE,ZESTORETIC) 20-25 MG tablet   Other Relevant Orders   Lipid Panel w/o Chol/HDL Ratio       Follow up plan: Return in about 6 months (around 04/05/2018).

## 2017-10-04 LAB — COMPREHENSIVE METABOLIC PANEL
ALT: 14 IU/L (ref 0–44)
AST: 17 IU/L (ref 0–40)
Albumin/Globulin Ratio: 1.4 (ref 1.2–2.2)
Albumin: 4.1 g/dL (ref 3.6–4.8)
Alkaline Phosphatase: 67 IU/L (ref 39–117)
BUN/Creatinine Ratio: 14 (ref 10–24)
BUN: 17 mg/dL (ref 8–27)
Bilirubin Total: 0.6 mg/dL (ref 0.0–1.2)
CALCIUM: 9.6 mg/dL (ref 8.6–10.2)
CHLORIDE: 99 mmol/L (ref 96–106)
CO2: 23 mmol/L (ref 20–29)
Creatinine, Ser: 1.2 mg/dL (ref 0.76–1.27)
GFR calc Af Amer: 72 mL/min/{1.73_m2} (ref >59)
GFR calc non Af Amer: 63 mL/min/{1.73_m2} (ref >59)
GLOBULIN, TOTAL: 2.9 g/dL (ref 1.5–4.5)
GLUCOSE: 82 mg/dL (ref 65–99)
POTASSIUM: 4.4 mmol/L (ref 3.5–5.2)
Sodium: 137 mmol/L (ref 134–144)
Total Protein: 7 g/dL (ref 6.0–8.5)

## 2017-10-04 LAB — LIPID PANEL W/O CHOL/HDL RATIO
Cholesterol, Total: 139 mg/dL (ref 100–199)
HDL: 64 mg/dL (ref >39)
LDL Calculated: 61 mg/dL (ref 0–99)
TRIGLYCERIDES: 72 mg/dL (ref 0–149)
VLDL Cholesterol Cal: 14 mg/dL (ref 5–40)

## 2017-10-04 LAB — HEPATITIS C ANTIBODY: Hep C Virus Ab: <0.1 s/co ratio (ref 0.0–0.9)

## 2017-10-05 ENCOUNTER — Encounter: Payer: Self-pay | Admitting: Unknown Physician Specialty

## 2017-10-16 ENCOUNTER — Ambulatory Visit: Payer: Medicare HMO | Admitting: Oncology

## 2017-10-16 ENCOUNTER — Ambulatory Visit: Payer: Medicare PPO

## 2017-10-19 ENCOUNTER — Ambulatory Visit (INDEPENDENT_AMBULATORY_CARE_PROVIDER_SITE_OTHER): Payer: Medicare HMO | Admitting: Vascular Surgery

## 2017-10-19 ENCOUNTER — Encounter (INDEPENDENT_AMBULATORY_CARE_PROVIDER_SITE_OTHER): Payer: Medicare HMO

## 2017-10-29 ENCOUNTER — Telehealth: Payer: Self-pay | Admitting: Oncology

## 2017-10-29 ENCOUNTER — Encounter: Payer: Self-pay | Admitting: Oncology

## 2017-10-30 ENCOUNTER — Inpatient Hospital Stay: Payer: Medicare PPO | Attending: Oncology | Admitting: Oncology

## 2017-10-30 ENCOUNTER — Ambulatory Visit
Admission: RE | Admit: 2017-10-30 | Discharge: 2017-10-30 | Disposition: A | Discharge status: Home / Self Care | Payer: Medicare PPO | Source: Ambulatory Visit | Attending: Oncology | Admitting: Oncology

## 2017-10-30 DIAGNOSIS — Z122 Encounter for screening for malignant neoplasm of respiratory organs: Secondary | ICD-10-CM

## 2017-10-30 DIAGNOSIS — J439 Emphysema, unspecified: Secondary | ICD-10-CM | POA: Insufficient documentation

## 2017-10-30 DIAGNOSIS — Z87891 Personal history of nicotine dependence: Secondary | ICD-10-CM | POA: Diagnosis present

## 2017-10-30 DIAGNOSIS — I700 Atherosclerosis of aorta: Secondary | ICD-10-CM | POA: Diagnosis not present

## 2017-10-30 NOTE — Progress Notes (Signed)
In accordance with CMS guidelines, patient has met eligibility criteria including age, absence of signs or symptoms of lung cancer.  Social History   Tobacco Use  . Smoking status: Former Smoker    Packs/day: 0.75    Years: 44.00    Pack years: 33.00    Types: Cigarettes    Last attempt to quit: 06/17/2016    Years since quitting: 1.3  . Smokeless tobacco: Never Used  Substance Use Topics  . Alcohol use: Yes    Alcohol/week: 0.0 oz    Comment: on occasion  . Drug use: No     A shared decision-making session was conducted prior to the performance of CT scan. This includes one or more decision aids, includes benefits and harms of screening, follow-up diagnostic testing, over-diagnosis, false positive rate, and total radiation exposure.  Counseling on the importance of adherence to annual lung cancer LDCT screening, impact of co-morbidities, and ability or willingness to undergo diagnosis and treatment is imperative for compliance of the program.  Counseling on the importance of continued smoking cessation for former smokers; the importance of smoking cessation for current smokers, and information about tobacco cessation interventions have been given to patient including [place] and [facility] quit [place] programs.  Written order for lung cancer screening with LDCT has been given to the patient and any and all questions have been answered to the best of my abilities.   Yearly follow up will be coordinated by Burgess Estelle, Thoracic Navigator.  Faythe Casa, NP 10/30/2017 12:16 PM

## 2017-11-05 ENCOUNTER — Encounter: Payer: Self-pay | Admitting: *Deleted

## 2017-11-14 ENCOUNTER — Other Ambulatory Visit: Payer: Self-pay | Admitting: Unknown Physician Specialty

## 2018-03-27 IMAGING — CT CT ABD-PEL WO/W CM
2 of 4 series · 12 of 32 positions shown, 17 images · IV contrast (iopamidol)
Comparison: None.

CLINICAL DATA: Microscopic hematuria. Nocturia. Weak urinary
stream. CVA. Erectile dysfunction. Hypertension and hyperlipidemia.
Chronic kidney disease.

EXAM:
CT ABDOMEN AND PELVIS WITHOUT AND WITH CONTRAST
TECHNIQUE: Multidetector CT imaging of the abdomen and pelvis was performed
following the standard protocol before and following the bolus
administration of intravenous contrast.
CONTRAST:  100mL [id number] IOPAMIDOL ([id number]) INJECTION 61%

[Series 6: axial post · axial · 0.74mm/px · z∈[-1095,-745]mm · 8 of 91 slices shown, 13 images]
[im 11/91  soft-tissue]
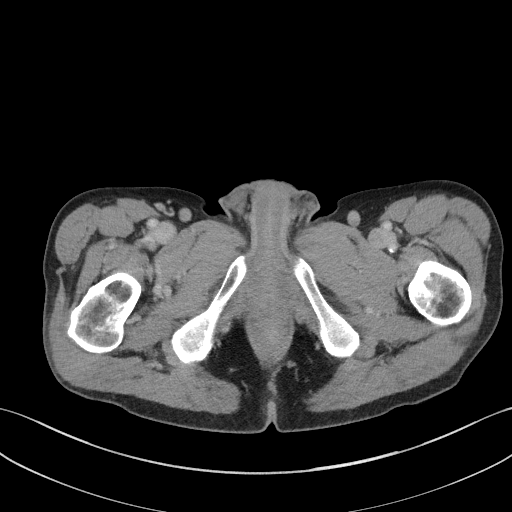
[im 11/91  bone]
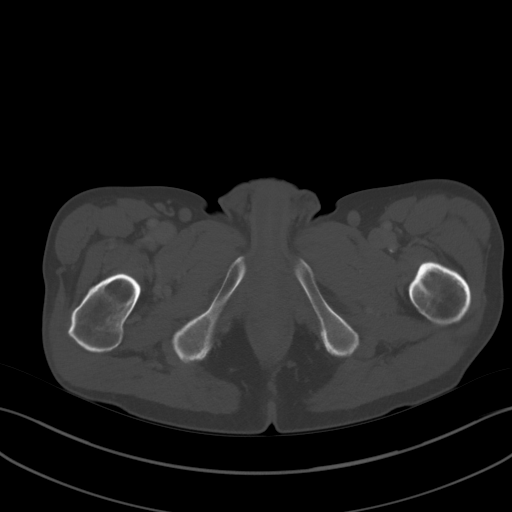
[im 21/91  soft-tissue]
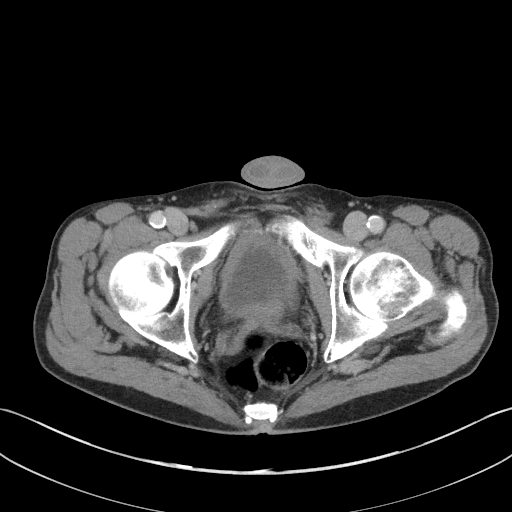
[im 31/91  soft-tissue]
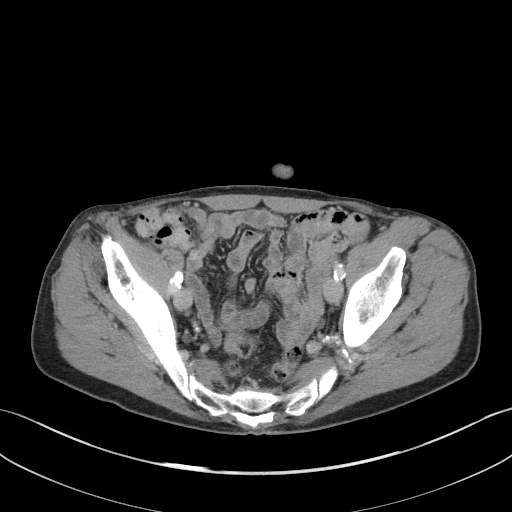
[im 41/91  soft-tissue]
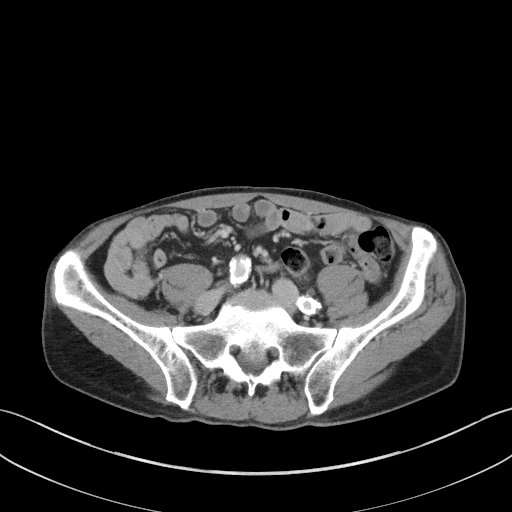
[im 51/91  soft-tissue]
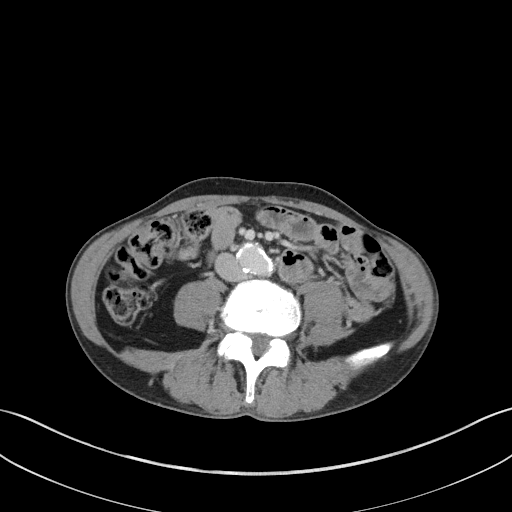
[im 51/91  lung]
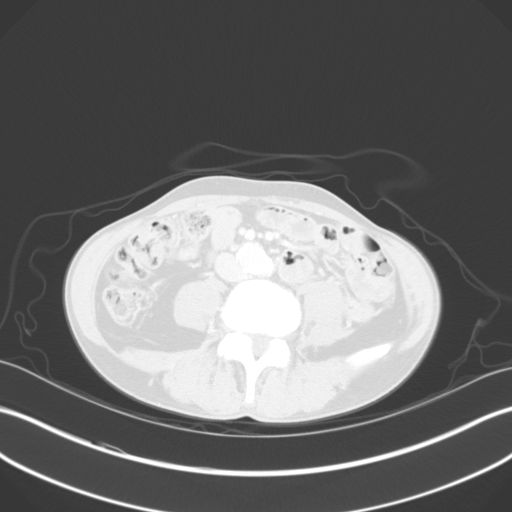
[im 61/91  soft-tissue]
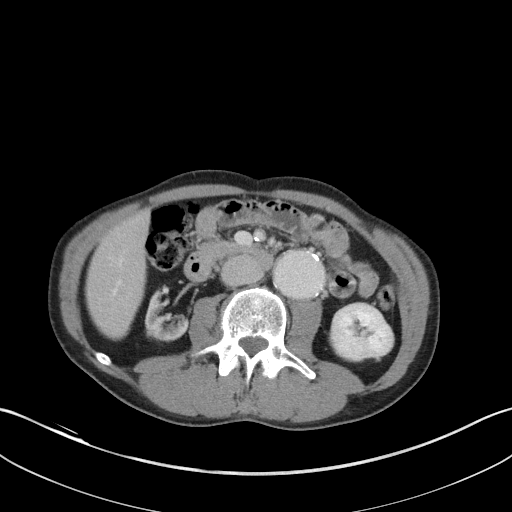
[im 61/91  lung]
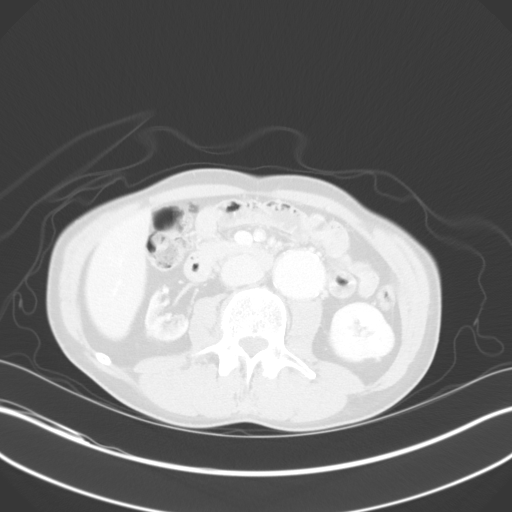
[im 71/91  soft-tissue]
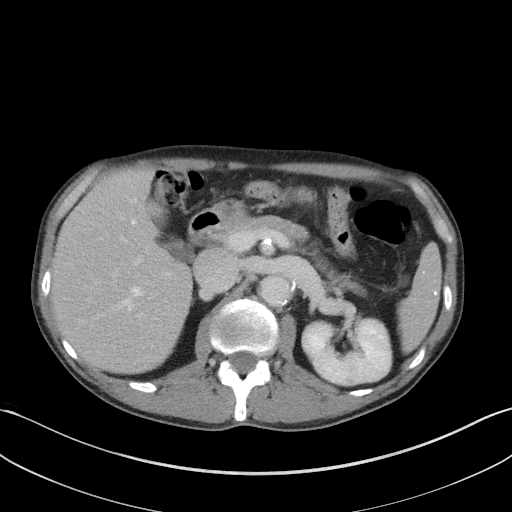
[im 71/91  lung]
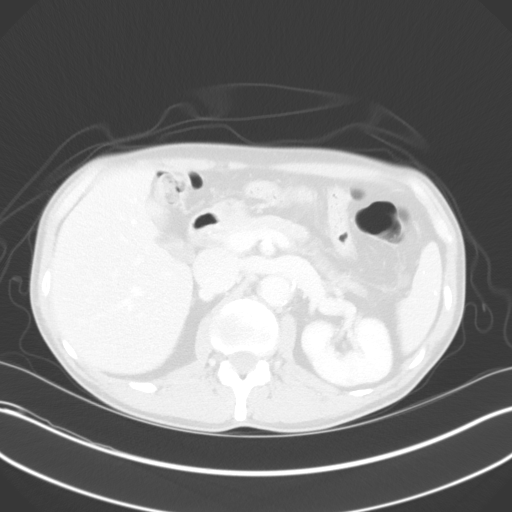
[im 81/91  soft-tissue]
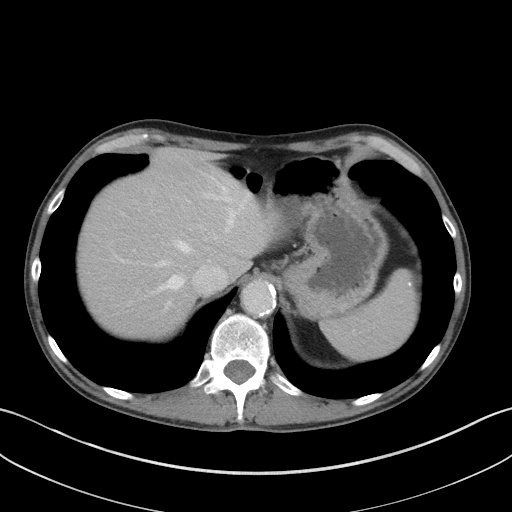
[im 81/91  lung]
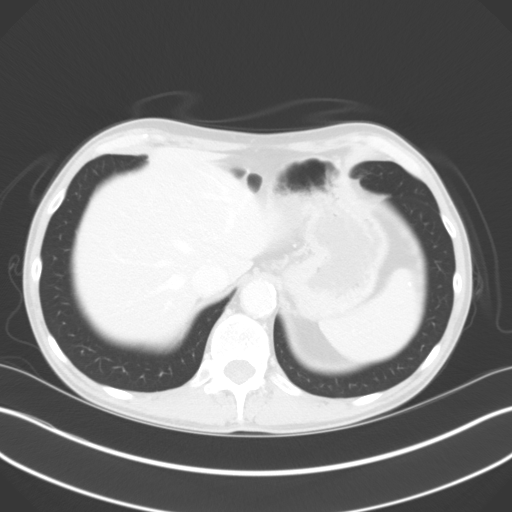

[Series 13: axial delay · axial · delayed · 0.74mm/px · z∈[-1055,-905]mm · 4 of 92 slices shown]
[im 11/92  soft-tissue]
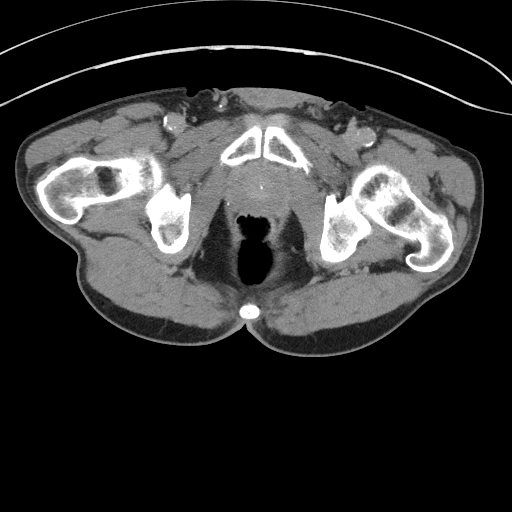
[im 21/92  soft-tissue]
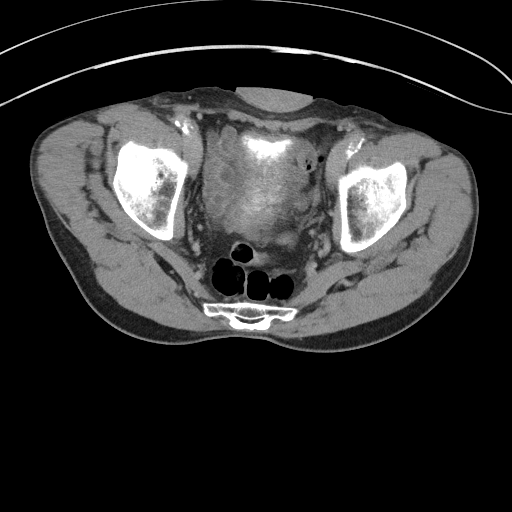
[im 31/92  soft-tissue]
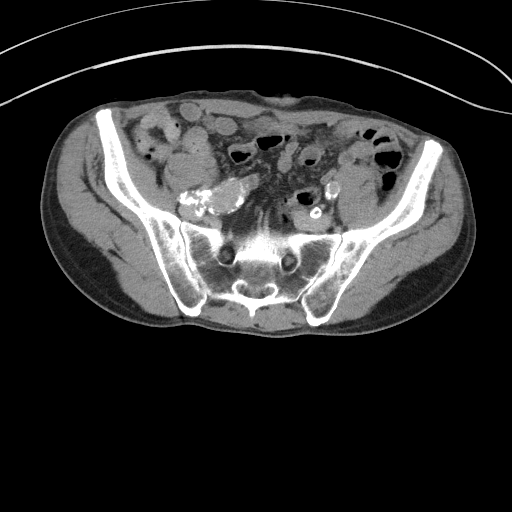
[im 41/92  soft-tissue]
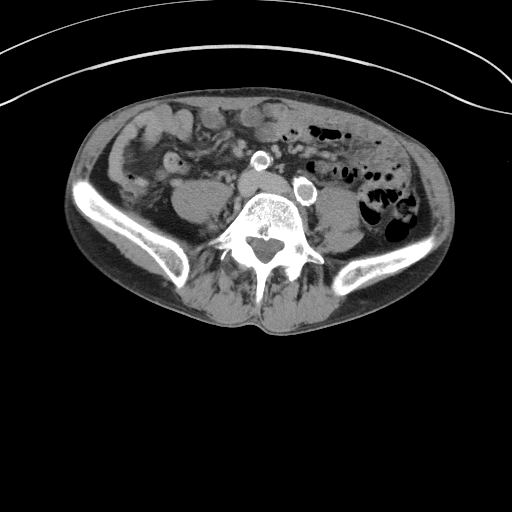

[12 of 32 positions shown; findings below may reference images not displayed]

FINDINGS: Lower chest: Clear lung bases. Normal heart size without pericardial
or pleural effusion. Right coronary artery atherosclerosis.

Hepatobiliary: Right hepatic lobe subcentimeter cyst. Normal
gallbladder, without biliary ductal dilatation.

Pancreas: Normal, without mass or ductal dilatation.

Spleen: Old granulomatous disease in the spleen.

Adrenals/Urinary Tract: Normal adrenal glands. No renal calculi or
hydronephrosis. No hydroureter. Extensive vascular calcification is,
no convincing evidence of ureteric stone. No bladder calculi.

Moderate to marked right renal atrophy. Bilateral renal cysts.
Right-sided too small to characterize lesions. An interpolar right
renal lesion measures 9 mm on image 37/series 13. Eleven HU prior to
contrast and on the order of 32 HU on delayed images.

No significant renal collecting system opacification on delayed
images, consistent with limited renal function. Moderately well
opacified left renal collecting system. The ureters are not well
opacified on delayed images.

The bladder wall is diffusely mildly thickened. No focal bladder
filling defect on moderately well distended delayed images. No
enhancing bladder mass.

Stomach/Bowel: Normal stomach, without wall thickening. Normal colon
and terminal ileum. Normal small bowel.

Vascular/Lymphatic: Advanced aortic and branch vessel
atherosclerosis. Infrarenal abdominal aortic aneurysm at 4.0 x
cm. The aorta narrows just above the bifurcation. The common iliacs
are ectatic, 1.6 cm bilaterally proximally. The right common iliac
becomes aneurysmally dilated just proximal to its bifurcation at
cm. No abdominopelvic adenopathy.

Reproductive: Mild prostatomegaly.

Other: No significant free fluid.

Musculoskeletal: Convex left lumbar spine curvature. Degenerative
disc disease at L4-5.
IMPRESSION: 1. Bladder wall thickening and irregularity is mild and suspicious
for a component of outlet obstruction, likely due to prostatomegaly.
2. Moderate to marked right renal atrophy, likely due to renal
artery stenosis. Interpolar right renal lesion is borderline too
small to characterize but may demonstrate mild enhancement on
delayed images. Potential clinical strategies include further
evaluation with pre and post contrast abdominal MRI either at 1 year
or more acutely. If MRI is not feasible, pre and postcontrast CT at
1 year should be considered.
3. Aneurysmal dilatation of the infrarenal aorta. Recommend followup
by ultrasound in 1 year. This recommendation follows ACR consensus
guidelines: White Paper of the ACR Incidental Findings Committee II
on Vascular Findings. [HOSPITAL] 7744; [DATE].
4. Aneurysmal dilatation of the right common iliac artery. Ectasia
of the left common iliac.
5. Prostatomegaly.
6.  Coronary artery atherosclerosis. Aortic atherosclerosis.

## 2018-04-08 ENCOUNTER — Encounter: Payer: Self-pay | Admitting: Family Medicine

## 2018-04-08 ENCOUNTER — Ambulatory Visit: Payer: Medicare PPO | Admitting: Family Medicine

## 2018-04-08 VITALS — BP 123/71 | HR 104 | Temp 98.8°F | Ht 72.0 in | Wt 148.4 lb

## 2018-04-08 DIAGNOSIS — E782 Mixed hyperlipidemia: Secondary | ICD-10-CM | POA: Diagnosis not present

## 2018-04-08 DIAGNOSIS — I129 Hypertensive chronic kidney disease with stage 1 through stage 4 chronic kidney disease, or unspecified chronic kidney disease: Secondary | ICD-10-CM | POA: Diagnosis not present

## 2018-04-08 DIAGNOSIS — Z23 Encounter for immunization: Secondary | ICD-10-CM | POA: Diagnosis not present

## 2018-04-08 NOTE — Progress Notes (Signed)
BP 123/71 (BP Location: Left Arm, Patient Position: Sitting, Cuff Size: Normal)   Pulse (!) 104   Temp 98.8 F (37.1 C)   Ht 6' (1.829 m)   Wt 148 lb 7 oz (67.3 kg)   SpO2 100%   BMI 20.13 kg/m    Subjective:    Patient ID: Aaron Boyd, male    DOB: 1951/03/23, 68 y.o.   MRN: [id number]  HPI: Aaron Boyd is a 68 y.o. male  Chief Complaint  Patient presents with  . Hyperlipidemia  . Hypertension   Here today for 6 month follow up. Taking medications faithfully without side effects.   Occasionally checking BPs at home, getting readings under 140/90 consistently. Denies CP, SOB, dizziness, HAs. Doing well with diet and exercise. Taking lipitor for cholesterol management. No myalgias or claudication.   Relevant past medical, surgical, family and social history reviewed and updated as indicated. Interim medical history since our last visit reviewed. Allergies and medications reviewed and updated.  Review of Systems  Per HPI unless specifically indicated above     Objective:    BP 123/71 (BP Location: Left Arm, Patient Position: Sitting, Cuff Size: Normal)   Pulse (!) 104   Temp 98.8 F (37.1 C)   Ht 6' (1.829 m)   Wt 148 lb 7 oz (67.3 kg)   SpO2 100%   BMI 20.13 kg/m   Wt Readings from Last 3 Encounters:  04/08/18 148 lb 7 oz (67.3 kg)  10/30/17 155 lb (70.3 kg)  10/03/17 155 lb 14.4 oz (70.7 kg)    Physical Exam Vitals signs and nursing note reviewed.  Constitutional:      Appearance: Normal appearance.  HENT:     Head: Atraumatic.  Eyes:     Extraocular Movements: Extraocular movements intact.     Conjunctiva/sclera: Conjunctivae normal.  Neck:     Musculoskeletal: Normal range of motion and neck supple.  Cardiovascular:     Rate and Rhythm: Normal rate and regular rhythm.  Pulmonary:     Effort: Pulmonary effort is normal.     Breath sounds: Normal breath sounds.  Musculoskeletal: Normal range of motion.  Skin:    General: Skin is warm and  dry.  Neurological:     General: No focal deficit present.     Mental Status: He is alert and oriented to person, place, and time.  Psychiatric:        Mood and Affect: Mood normal.        Thought Content: Thought content normal.        Judgment: Judgment normal.     Results for orders placed or performed in visit on 04/08/18  Lipid Panel w/o Chol/HDL Ratio  Result Value Ref Range   Cholesterol, Total 153 100 - 199 mg/dL   Triglycerides 94 0 - 149 mg/dL   HDL 62 >02 mg/dL   VLDL Cholesterol Cal 19 5 - 40 mg/dL   LDL Calculated 72 0 - 99 mg/dL  Comprehensive metabolic panel  Result Value Ref Range   Glucose 59 (L) 65 - 99 mg/dL   BUN 19 8 - 27 mg/dL   Creatinine, Ser 6.37 0.76 - 1.27 mg/dL   GFR calc non Af Amer 63 >59 mL/min/1.73   GFR calc Af Amer 72 >59 mL/min/1.73   BUN/Creatinine Ratio 16 10 - 24   Sodium 139 134 - 144 mmol/L   Potassium 3.5 3.5 - 5.2 mmol/L   Chloride 97 96 - 106 mmol/L  CO2 24 20 - 29 mmol/L   Calcium 9.6 8.6 - 10.2 mg/dL   Total Protein 7.1 6.0 - 8.5 g/dL   Albumin 4.4 3.6 - 4.8 g/dL   Globulin, Total 2.7 1.5 - 4.5 g/dL   Albumin/Globulin Ratio 1.6 1.2 - 2.2   Bilirubin Total 0.4 0.0 - 1.2 mg/dL   Alkaline Phosphatase 61 39 - 117 IU/L   AST 16 0 - 40 IU/L   ALT 15 0 - 44 IU/L      Assessment & Plan:   Problem List Items Addressed This Visit      Genitourinary   Hypertensive renal disease - Primary    Stable and WNL, continue current regimen      Relevant Orders   Comprehensive metabolic panel (Completed)     Other   Hyperlipidemia, unspecified    Stable, recheck lipids and adjust as needed      Relevant Orders   Lipid Panel w/o Chol/HDL Ratio (Completed)    Other Visit Diagnoses    Immunization due       Relevant Orders   Flu vaccine HIGH DOSE PF (Fluzone High dose) (Completed)       Follow up plan: Return in about 6 months (around 10/07/2018) for CPE.

## 2018-04-09 ENCOUNTER — Encounter: Payer: Self-pay | Admitting: Family Medicine

## 2018-04-09 LAB — COMPREHENSIVE METABOLIC PANEL
A/G RATIO: 1.6 (ref 1.2–2.2)
ALBUMIN: 4.4 g/dL (ref 3.6–4.8)
ALK PHOS: 61 IU/L (ref 39–117)
ALT: 15 IU/L (ref 0–44)
AST: 16 IU/L (ref 0–40)
BILIRUBIN TOTAL: 0.4 mg/dL (ref 0.0–1.2)
BUN/Creatinine Ratio: 16 (ref 10–24)
BUN: 19 mg/dL (ref 8–27)
CALCIUM: 9.6 mg/dL (ref 8.6–10.2)
CHLORIDE: 97 mmol/L (ref 96–106)
CO2: 24 mmol/L (ref 20–29)
Creatinine, Ser: 1.2 mg/dL (ref 0.76–1.27)
GFR calc non Af Amer: 63 mL/min/{1.73_m2} (ref >59)
GFR, EST AFRICAN AMERICAN: 72 mL/min/{1.73_m2} (ref >59)
Globulin, Total: 2.7 g/dL (ref 1.5–4.5)
Glucose: 59 mg/dL — ABNORMAL LOW (ref 65–99)
POTASSIUM: 3.5 mmol/L (ref 3.5–5.2)
Sodium: 139 mmol/L (ref 134–144)
TOTAL PROTEIN: 7.1 g/dL (ref 6.0–8.5)

## 2018-04-09 LAB — LIPID PANEL W/O CHOL/HDL RATIO
Cholesterol, Total: 153 mg/dL (ref 100–199)
HDL: 62 mg/dL (ref >39)
LDL Calculated: 72 mg/dL (ref 0–99)
TRIGLYCERIDES: 94 mg/dL (ref 0–149)
VLDL Cholesterol Cal: 19 mg/dL (ref 5–40)

## 2018-04-10 NOTE — Assessment & Plan Note (Signed)
Stable and WNL, continue current regimen 

## 2018-04-10 NOTE — Assessment & Plan Note (Signed)
Stable, recheck lipids and adjust as needed 

## 2018-04-18 ENCOUNTER — Ambulatory Visit: Payer: Medicare PPO | Admitting: Urology

## 2018-04-18 ENCOUNTER — Other Ambulatory Visit: Payer: Self-pay | Admitting: Family Medicine

## 2018-04-18 NOTE — Telephone Encounter (Signed)
Requested Prescriptions  Pending Prescriptions Disp Refills  . amLODipine (NORVASC) 5 MG tablet [Pharmacy Med Name: AMLODIPINE BESYLATE 5 MG Tablet] 90 tablet 1    Sig: TAKE 1 TABLET EVERY DAY     Cardiovascular:  Calcium Channel Blockers Passed - 04/18/2018  1:39 PM      Passed - Last BP in normal range    BP Readings from Last 1 Encounters:  04/08/18 123/71         Passed - Valid encounter within last 6 months    Recent Outpatient Visits          1 week ago Hypertensive renal disease   [facility] Particia Nearing, PA-C   6 months ago Mixed hyperlipidemia   [facility] Gabriel Cirri, NP   2 years ago Essential hypertension   [facility] Gabriel Cirri, NP   2 years ago SOB (shortness of breath)   [facility] Gabriel Cirri, NP   2 years ago Acute exacerbation of chronic bronchitis (HCC)   [facility] Gabriel Cirri, NP      Future Appointments            In 3 weeks Stoioff, Verna Czech, MD [facility]   In 5 months  [facility]   In 5 months Dora, Salley Hews, PA-C [facility], [facility]

## 2018-04-22 ENCOUNTER — Ambulatory Visit: Payer: Medicare HMO | Admitting: Urology

## 2018-05-07 ENCOUNTER — Other Ambulatory Visit: Payer: Self-pay | Admitting: Unknown Physician Specialty

## 2018-05-07 NOTE — Telephone Encounter (Signed)
Requested Prescriptions  Pending Prescriptions Disp Refills  . lisinopril-hydrochlorothiazide (PRINZIDE,ZESTORETIC) 20-25 MG tablet [Pharmacy Med Name: LISINOPRIL/HYDROCHLOROTHIAZIDE 20-25 MG Tablet] 90 tablet 1    Sig: TAKE 1 TABLET EVERY DAY     Cardiovascular:  ACEI + Diuretic Combos Passed - 05/07/2018  1:30 PM      Passed - Na in normal range and within 180 days    Sodium  Date Value Ref Range Status  04/08/2018 139 134 - 144 mmol/L Final         Passed - K in normal range and within 180 days    Potassium  Date Value Ref Range Status  04/08/2018 3.5 3.5 - 5.2 mmol/L Final         Passed - Cr in normal range and within 180 days    Creatinine, Ser  Date Value Ref Range Status  04/08/2018 1.20 0.76 - 1.27 mg/dL Final         Passed - Ca in normal range and within 180 days    Calcium  Date Value Ref Range Status  04/08/2018 9.6 8.6 - 10.2 mg/dL Final         Passed - Patient is not pregnant      Passed - Last BP in normal range    BP Readings from Last 1 Encounters:  04/08/18 123/71         Passed - Valid encounter within last 6 months    Recent Outpatient Visits          4 weeks ago Hypertensive renal disease   [facility] Particia Nearing, PA-C   7 months ago Mixed hyperlipidemia   [facility] Gabriel Cirri, NP   2 years ago Essential hypertension   [facility] Gabriel Cirri, NP   2 years ago SOB (shortness of breath)   [facility] Gabriel Cirri, NP   2 years ago Acute exacerbation of chronic bronchitis (HCC)   [facility] Gabriel Cirri, NP      Future Appointments            In 2 days Stoioff, Verna Czech, MD [facility]   In 5 months  [facility]   In 5 months [place], Salley Hews, PA-C [facility], [facility]

## 2018-05-09 ENCOUNTER — Ambulatory Visit: Payer: Medicare PPO | Admitting: Urology

## 2018-06-12 ENCOUNTER — Other Ambulatory Visit: Payer: Self-pay | Admitting: Unknown Physician Specialty

## 2018-07-30 ENCOUNTER — Ambulatory Visit (INDEPENDENT_AMBULATORY_CARE_PROVIDER_SITE_OTHER): Payer: Medicare HMO | Admitting: Vascular Surgery

## 2018-07-30 ENCOUNTER — Encounter (INDEPENDENT_AMBULATORY_CARE_PROVIDER_SITE_OTHER): Payer: Medicare HMO

## 2018-09-29 ENCOUNTER — Other Ambulatory Visit: Payer: Self-pay | Admitting: Unknown Physician Specialty

## 2018-09-29 NOTE — Telephone Encounter (Signed)
Requested Prescriptions  Pending Prescriptions Disp Refills  . lisinopril-hydrochlorothiazide (ZESTORETIC) 20-25 MG tablet [Pharmacy Med Name: LISINOPRIL/HYDROCHLOROTHIAZIDE 20-25 MG Tablet] 90 tablet 1    Sig: TAKE 1 TABLET EVERY DAY     Cardiovascular:  ACEI + Diuretic Combos Passed - 09/29/2018 11:22 AM      Passed - Na in normal range and within 180 days    Sodium  Date Value Ref Range Status  04/08/2018 139 134 - 144 mmol/L Final         Passed - K in normal range and within 180 days    Potassium  Date Value Ref Range Status  04/08/2018 3.5 3.5 - 5.2 mmol/L Final         Passed - Cr in normal range and within 180 days    Creatinine, Ser  Date Value Ref Range Status  04/08/2018 1.20 0.76 - 1.27 mg/dL Final         Passed - Ca in normal range and within 180 days    Calcium  Date Value Ref Range Status  04/08/2018 9.6 8.6 - 10.2 mg/dL Final         Passed - Patient is not pregnant      Passed - Last BP in normal range    BP Readings from Last 1 Encounters:  04/08/18 123/71         Passed - Valid encounter within last 6 months    Recent Outpatient Visits          5 months ago Hypertensive renal disease   [facility] Volney American, [place]   12 months ago Mixed hyperlipidemia   [facility] Kathrine Haddock, NP   2 years ago Essential hypertension   [place], [place], NP   2 years ago SOB (shortness of breath)   [facility] Kathrine Haddock, NP   3 years ago Acute exacerbation of chronic bronchitis ([place])   [facility] Kathrine Haddock, NP      Future Appointments            In 1 week  [facility] [place]   In 1 week Orene Desanctis, Lilia Argue, PA-C [facility], [facility]

## 2018-10-07 ENCOUNTER — Ambulatory Visit (INDEPENDENT_AMBULATORY_CARE_PROVIDER_SITE_OTHER): Payer: Medicare PPO

## 2018-10-07 DIAGNOSIS — Z0000 Encounter for general adult medical examination without abnormal findings: Secondary | ICD-10-CM

## 2018-10-07 NOTE — Patient Instructions (Signed)
Aaron Boyd , Thank you for taking time to come for your Medicare Wellness Visit. I appreciate your ongoing commitment to your health goals. Please review the following plan we discussed and let me know if I can assist you in the future.   Screening recommendations/referrals: Colonoscopy: completed 06/02/2010, due 2022 Recommended yearly ophthalmology/optometry visit for glaucoma screening and checkup Recommended yearly dental visit for hygiene and checkup  Vaccinations: Influenza vaccine: up to date  Pneumococcal vaccine: up to date  Tdap vaccine: due, check with your insurance company for coverage  Shingles vaccine: shingrix eligible, check with your insurance company for coverage   Advanced directives: Please bring a copy of your health care power of attorney and living will to the office at your convenience.  Conditions/risks identified: discussed ear flushing with debrox drops.   Next appointment: Follow up in one year for your annual wellness exam.   Preventive Care 65 Years and Older, Male Preventive care refers to lifestyle choices and visits with your health care provider that can promote health and wellness. What does preventive care include?  A yearly physical exam. This is also called an annual well check.  Dental exams once or twice a year.  Routine eye exams. Ask your health care provider how often you should have your eyes checked.  Personal lifestyle choices, including:  Daily care of your teeth and gums.  Regular physical activity.  Eating a healthy diet.  Avoiding tobacco and drug use.  Limiting alcohol use.  Practicing safe sex.  Taking low doses of aspirin every day.  Taking vitamin and mineral supplements as recommended by your health care provider. What happens during an annual well check? The services and screenings done by your health care provider during your annual well check will depend on your age, overall health, lifestyle risk factors, and  family history of disease. Counseling  Your health care provider may ask you questions about your:  Alcohol use.  Tobacco use.  Drug use.  Emotional well-being.  Home and relationship well-being.  Sexual activity.  Eating habits.  History of falls.  Memory and ability to understand (cognition).  Work and work Statistician. Screening  You may have the following tests or measurements:  Height, weight, and BMI.  Blood pressure.  Lipid and cholesterol levels. These may be checked every 5 years, or more frequently if you are over 57 years old.  Skin check.  Lung cancer screening. You may have this screening every year starting at age 57 if you have a 30-pack-year history of smoking and currently smoke or have quit within the past 15 years.  Fecal occult blood test (FOBT) of the stool. You may have this test every year starting at age 82.  Flexible sigmoidoscopy or colonoscopy. You may have a sigmoidoscopy every 5 years or a colonoscopy every 10 years starting at age 73.  Prostate cancer screening. Recommendations will vary depending on your family history and other risks.  Hepatitis C blood test.  Hepatitis B blood test.  Sexually transmitted disease (STD) testing.  Diabetes screening. This is done by checking your blood sugar (glucose) after you have not eaten for a while (fasting). You may have this done every 1-3 years.  Abdominal aortic aneurysm (AAA) screening. You may need this if you are a current or former smoker.  Osteoporosis. You may be screened starting at age 40 if you are at high risk. Talk with your health care provider about your test results, treatment options, and if necessary, the need  for more tests. Vaccines  Your health care provider may recommend certain vaccines, such as:  Influenza vaccine. This is recommended every year.  Tetanus, diphtheria, and acellular pertussis (Tdap, Td) vaccine. You may need a Td booster every 10 years.  Zoster  vaccine. You may need this after age 73.  Pneumococcal 13-valent conjugate (PCV13) vaccine. One dose is recommended after age 37.  Pneumococcal polysaccharide (PPSV23) vaccine. One dose is recommended after age 52. Talk to your health care provider about which screenings and vaccines you need and how often you need them. This information is not intended to replace advice given to you by your health care provider. Make sure you discuss any questions you have with your health care provider. Document Released: 04/16/2015 Document Revised: 12/08/2015 Document Reviewed: 01/19/2015 Elsevier Interactive Patient Education  2017 [place] Prevention in the Home Falls can cause injuries. They can happen to people of all ages. There are many things you can do to make your home safe and to help prevent falls. What can I do on the outside of my home?  Regularly fix the edges of walkways and driveways and fix any cracks.  Remove anything that might make you trip as you walk through a door, such as a raised step or threshold.  Trim any bushes or trees on the path to your home.  Use bright outdoor lighting.  Clear any walking paths of anything that might make someone trip, such as rocks or tools.  Regularly check to see if handrails are loose or broken. Make sure that both sides of any steps have handrails.  Any raised decks and porches should have guardrails on the edges.  Have any leaves, snow, or ice cleared regularly.  Use sand or salt on walking paths during winter.  Clean up any spills in your garage right away. This includes oil or grease spills. What can I do in the bathroom?  Use night lights.  Install grab bars by the toilet and in the tub and shower. Do not use towel bars as grab bars.  Use non-skid mats or decals in the tub or shower.  If you need to sit down in the shower, use a plastic, non-slip stool.  Keep the floor dry. Clean up any water that spills on the  floor as soon as it happens.  Remove soap buildup in the tub or shower regularly.  Attach bath mats securely with double-sided non-slip rug tape.  Do not have throw rugs and other things on the floor that can make you trip. What can I do in the bedroom?  Use night lights.  Make sure that you have a light by your bed that is easy to reach.  Do not use any sheets or blankets that are too big for your bed. They should not hang down onto the floor.  Have a firm chair that has side arms. You can use this for support while you get dressed.  Do not have throw rugs and other things on the floor that can make you trip. What can I do in the kitchen?  Clean up any spills right away.  Avoid walking on wet floors.  Keep items that you use a lot in easy-to-reach places.  If you need to reach something above you, use a strong step stool that has a grab bar.  Keep electrical cords out of the way.  Do not use floor polish or wax that makes floors slippery. If you must use wax, use  non-skid floor wax.  Do not have throw rugs and other things on the floor that can make you trip. What can I do with my stairs?  Do not leave any items on the stairs.  Make sure that there are handrails on both sides of the stairs and use them. Fix handrails that are broken or loose. Make sure that handrails are as long as the stairways.  Check any carpeting to make sure that it is firmly attached to the stairs. Fix any carpet that is loose or worn.  Avoid having throw rugs at the top or bottom of the stairs. If you do have throw rugs, attach them to the floor with carpet tape.  Make sure that you have a light switch at the top of the stairs and the bottom of the stairs. If you do not have them, ask someone to add them for you. What else can I do to help prevent falls?  Wear shoes that:  Do not have high heels.  Have rubber bottoms.  Are comfortable and fit you well.  Are closed at the toe. Do not wear  sandals.  If you use a stepladder:  Make sure that it is fully opened. Do not climb a closed stepladder.  Make sure that both sides of the stepladder are locked into place.  Ask someone to hold it for you, if possible.  Clearly mark and make sure that you can see:  Any grab bars or handrails.  First and last steps.  Where the edge of each step is.  Use tools that help you move around (mobility aids) if they are needed. These include:  Canes.  Walkers.  Scooters.  Crutches.  Turn on the lights when you go into a dark area. Replace any light bulbs as soon as they burn out.  Set up your furniture so you have a clear path. Avoid moving your furniture around.  If any of your floors are uneven, fix them.  If there are any pets around you, be aware of where they are.  Review your medicines with your doctor. Some medicines can make you feel dizzy. This can increase your chance of falling. Ask your doctor what other things that you can do to help prevent falls. This information is not intended to replace advice given to you by your health care provider. Make sure you discuss any questions you have with your health care provider. Document Released: 01/14/2009 Document Revised: 08/26/2015 Document Reviewed: 04/24/2014 Elsevier Interactive Patient Education  2017 [employer].

## 2018-10-07 NOTE — Progress Notes (Signed)
Subjective:   Aaron LaineRobert D Boyd is a 68 y.o. male who presents for Medicare Annual/Subsequent preventive examination.  This visit is being conducted via phone call  - after an attmept to do on video chat - due to the COVID-19 pandemic. This patient has given me verbal consent via phone to conduct this visit, patient states they are participating from their home address. Some vital signs may be absent or patient reported.   Patient identification: identified by name, DOB, and current address.    Review of Systems:   Cardiac Risk Factors include: advanced age (>955men, 65>65 women);male gender;dyslipidemia;hypertension     Objective:    Vitals: There were no vitals taken for this visit.  There is no height or weight on file to calculate BMI.  Advanced Directives 10/07/2018 10/03/2017 09/05/2016 08/08/2016  Does Patient Have a Medical Advance Directive? Yes Yes Yes Yes  Type of Advance Directive Living will;Healthcare Power of Attorney Living will;Healthcare Power of [employer] Healthcare Power of [place]orney;Living will -  Does patient want to make changes to medical advance directive? - - - No - Patient declined  Copy of Healthcare Power of Attorney in Chart? No - copy requested No - copy requested - -    Tobacco Social History   Tobacco Use  Smoking Status Current Every Day Smoker  . Packs/day: 0.50  . Years: 44.00  . Pack years: 22.00  . Types: Cigarettes  . Last attempt to quit: 06/17/2016  . Years since quitting: 2.3  Smokeless Tobacco Never Used  Tobacco Comment   quit 06/17/2016, restarted smoking 03/2018     Ready to quit: Yes Counseling given: Yes Comment: quit 06/17/2016, restarted smoking 03/2018   Clinical Intake:  Pre-visit preparation completed: Yes  Pain : No/denies pain     Nutritional Risks: None Diabetes: No  How often do you need to have someone help you when you read instructions, pamphlets, or other written materials from your doctor or pharmacy?: 1 - Never  What is the last grade level you completed in school?: high school    Interpreter Needed?: No  Information entered by ::  ,LPN  Past Medical History:  Diagnosis Date  . CVA (cerebral infarction) 2005  . ED (erectile dysfunction)   . Hematuria   . Hyperlipidemia   . Hypertension    Past Surgical History:  Procedure Laterality Date  . clavicle surgery  1970  . EYE SURGERY Left   . left ear trauma    . varicose veins     Family History  Problem Relation Age of Onset  . Diabetes Mother   . Heart disease Father   . Hypertension Father   . Heart disease Brother    Social History   Socioeconomic History  . Marital status: Widowed    Spouse name: Not on file  . Number of children: Not on file  . Years of education: Not on file  . Highest education level: High school graduate  Occupational History  . Occupation: retired  Engineer, productionocial Needs  . Financial resource strain: Not hard at all  . Food insecurity    Worry: Never true    Inability: Never true  . Transportation needs    Medical: No    Non-medical: No  Tobacco Use  . Smoking status: Current Every Day Smoker    Packs/day: 0.50    Years: 44.00    Pack years: 22.00    Types: Cigarettes    Last attempt to quit: 06/17/2016  Years since quitting: 2.3  . Smokeless tobacco: Never Used  . Tobacco comment: quit 06/17/2016, restarted smoking 03/2018  Substance and Sexual Activity  . Alcohol use: Yes    Alcohol/week: 0.0 standard drinks    Comment: on occasion  . Drug use: No  . Sexual activity: Not on file  Lifestyle  . Physical activity    Days per week: 0 days    Minutes per session: 0 min  . Stress: Not at all  Relationships  . Social connections    Talks on phone: More than three times a week    Gets together: More than three times a week    Attends religious service: Never    Active member of club or organization: No    Attends meetings of clubs or organizations: Never    Relationship status:  Widowed  Other Topics Concern  . Not on file  Social History Narrative  . Not on file    Outpatient Encounter Medications as of 10/07/2018  Medication Sig  . acetaminophen (TYLENOL) 325 MG tablet Take 650 mg by mouth every 6 (six) hours as needed.  Marland Kitchen. amLODipine (NORVASC) 5 MG tablet TAKE 1 TABLET EVERY DAY  . aspirin 325 MG tablet Take 325 mg by mouth daily.  Marland Kitchen. atorvastatin (LIPITOR) 20 MG tablet TAKE 1 TABLET (20 MG TOTAL) BY MOUTH DAILY.  Marland Kitchen. lisinopril-hydrochlorothiazide (ZESTORETIC) 20-25 MG tablet TAKE 1 TABLET EVERY DAY  . Multiple Vitamins-Minerals (CENTRUM SILVER 50+MEN PO) Take 1 tablet by mouth daily.  . Omega 3 1000 MG CAPS Take 1,000 mg by mouth.   No facility-administered encounter medications on file as of 10/07/2018.     Activities of Daily Living In your present state of health, do you have any difficulty performing the following activities: 10/07/2018  Hearing? N  Vision? N  Difficulty concentrating or making decisions? N  Walking or climbing stairs? N  Dressing or bathing? N  Doing errands, shopping? N  Preparing Food and eating ? N  Using the Toilet? N  In the past six months, have you accidently leaked urine? N  Do you have problems with loss of bowel control? N  Managing your Medications? N  Managing your Finances? N  Housekeeping or managing your Housekeeping? N  Some recent data might be hidden    Patient Care Team: Particia NearingLane, Rachel Elizabeth, PA-C as PCP - General (Family Medicine)   Assessment:   This is a routine wellness examination for Aaron MaduroRobert.  Exercise Activities and Dietary recommendations Current Exercise Habits: The patient does not participate in regular exercise at present, Exercise limited by: None identified  Goals    . Quit Smoking     tobacco use cessation discussed       Fall Risk Fall Risk  10/07/2018 10/03/2017 08/08/2016  Falls in the past year? 0 No No   FALL RISK PREVENTION PERTAINING TO THE HOME:  Any stairs in or around the home?  Yes  If so, are there any without handrails? No   Home free of loose throw rugs in walkways, pet beds, electrical cords, etc? Yes  Adequate lighting in your home to reduce risk of falls? Yes   ASSISTIVE DEVICES UTILIZED TO PREVENT FALLS:  Life alert? No  Use of a cane, walker or w/c? No  Grab bars in the bathroom? No  Shower chair or bench in shower? No  Elevated toilet seat or a handicapped toilet? No    TIMED UP AND GO:  Unable to perform  Depression Screen PHQ 2/9 Scores 10/07/2018 10/03/2017 08/08/2016  PHQ - 2 Score 0 0 0  PHQ- 9 Score - - 0    Cognitive Function     6CIT Screen 10/03/2017  What Year? 0 points  What month? 0 points  What time? 0 points  Count back from 20 0 points  Months in reverse 0 points  Repeat phrase 2 points  Total Score 2    Immunization History  Administered Date(s) Administered  . Influenza, High Dose Seasonal PF 12/06/2016, 04/08/2018  . Influenza,inj,Quad PF,6+ Mos 01/19/2015, 01/07/2016  . Influenza-Unspecified 02/09/2014  . Pneumococcal Conjugate-13 08/08/2016  . Pneumococcal Polysaccharide-23 10/03/2017    Qualifies for Shingles Vaccine? Yes  Zostavax completed n/a. Due for Shingrix. Education has been provided regarding the importance of this vaccine. Pt has been advised to call insurance company to determine out of pocket expense. Advised may also receive vaccine at local pharmacy or Health Dept. Verbalized acceptance and understanding.  Tdap: Discussed need for TD/TDAP vaccine, patient verbalized understanding that this is not covered as a preventative with there insurance and to call the office if he develops any new skin injuries, ie: cuts, scrapes, bug bites, or open wounds.  Flu Vaccine: up to date   Pneumococcal Vaccine: up to date   Screening Tests Health Maintenance  Topic Date Due  . TETANUS/TDAP  10/07/2019 (Originally 06/19/1970)  . INFLUENZA VACCINE  11/02/2018  . COLONOSCOPY  06/01/2020  . Hepatitis C Screening   Completed  . PNA vac Low Risk Adult  Completed   Cancer Screenings:  Colorectal Screening: Completed 06/02/2010. Repeat every 10 years  Lung Cancer Screening: (Low Dose CT Chest recommended if Age 42-80 years, 30 pack-year currently smoking OR have quit w/in 15years.) does qualify.  Completed 10/30/2017   Additional Screening:  Hepatitis C Screening: does qualify; Completed 10/03/2017  Dental Screening: Recommended annual dental exams for proper oral hygiene  Community Resource Referral:  CRR required this visit?  No        Plan:  I have personally reviewed and addressed the Medicare Annual Wellness questionnaire and have noted the following in the patient's chart:  A. Medical and social history B. Use of alcohol, tobacco or illicit drugs  C. Current medications and supplements D. Functional ability and status E.  Nutritional status F.  Physical activity G. Advance directives H. List of other physicians I.  Hospitalizations, surgeries, and ER visits in previous 12 months J.  [place] such as hearing and vision if needed, cognitive and depression L. Referrals and appointments   In addition, I have reviewed and discussed with patient certain preventive protocols, quality metrics, and best practice recommendations. A written personalized care plan for preventive services as well as general preventive health recommendations were provided to patient.   Signed,   Bevelyn Ngo, LPN  0/11/6759 Nurse Health Advisor  Nurse Notes: none

## 2018-10-10 ENCOUNTER — Encounter: Payer: Self-pay | Admitting: Family Medicine

## 2018-10-10 ENCOUNTER — Ambulatory Visit (INDEPENDENT_AMBULATORY_CARE_PROVIDER_SITE_OTHER): Payer: Medicare PPO | Admitting: Family Medicine

## 2018-10-10 ENCOUNTER — Other Ambulatory Visit: Payer: Self-pay

## 2018-10-10 VITALS — BP 127/74 | HR 97 | Temp 98.6°F | Ht 72.0 in | Wt 146.0 lb

## 2018-10-10 DIAGNOSIS — J449 Chronic obstructive pulmonary disease, unspecified: Secondary | ICD-10-CM

## 2018-10-10 DIAGNOSIS — Z0000 Encounter for general adult medical examination without abnormal findings: Secondary | ICD-10-CM | POA: Diagnosis not present

## 2018-10-10 DIAGNOSIS — I6523 Occlusion and stenosis of bilateral carotid arteries: Secondary | ICD-10-CM | POA: Diagnosis not present

## 2018-10-10 DIAGNOSIS — E782 Mixed hyperlipidemia: Secondary | ICD-10-CM

## 2018-10-10 DIAGNOSIS — Z8673 Personal history of transient ischemic attack (TIA), and cerebral infarction without residual deficits: Secondary | ICD-10-CM | POA: Diagnosis not present

## 2018-10-10 DIAGNOSIS — I129 Hypertensive chronic kidney disease with stage 1 through stage 4 chronic kidney disease, or unspecified chronic kidney disease: Secondary | ICD-10-CM | POA: Diagnosis not present

## 2018-10-10 LAB — UA/M W/RFLX CULTURE, ROUTINE
Bilirubin, UA: NEGATIVE
Ketones, UA: NEGATIVE
Leukocytes,UA: NEGATIVE
Nitrite, UA: NEGATIVE
Protein,UA: NEGATIVE
Specific Gravity, UA: 1.01 (ref 1.005–1.030)
Urobilinogen, Ur: 0.2 mg/dL (ref 0.2–1.0)
pH, UA: 5 (ref 5.0–7.5)

## 2018-10-10 LAB — MICROSCOPIC EXAMINATION
Bacteria, UA: NONE SEEN
WBC, UA: NONE SEEN /hpf (ref 0–5)

## 2018-10-10 NOTE — Progress Notes (Signed)
BP 127/74    Pulse 97    Temp 98.6 F (37 C) (Oral)    Ht 6' (1.829 m)    Wt 146 lb (66.2 kg)    SpO2 98%    BMI 19.80 kg/m    Subjective:    Patient ID: Aaron Boyd, male    DOB: Apr 20, 1950, 68 y.o.   MRN: [id number]  HPI: Aaron Boyd is a 68 y.o. male presenting on 10/10/2018 for comprehensive medical examination. Current medical complaints include:see below  No new concerns, chronic medications well tolerating and patient taking faithfully. Denies CP, SOB, HAs, dizziness, or other side effects or sxs.   HTN with CKD- not checking home BPs. Typically WNL at OVs when checked. Denies issues with medications. Tries to eat low sodium and stay active.   HLD - On lipitor, no myalgias, claudication. Eats healthy and stays active.   Hx of TIA - no issues in interim, currently on aspirin and BP, chol regimens.   He currently lives with: Interim Problems from his last visit: no  Depression Screen done today and results listed below:  Depression screen [facility] 2/9 10/10/2018 10/07/2018 10/03/2017 08/08/2016  Decreased Interest 0 0 0 0  Down, Depressed, Hopeless 0 0 0 0  PHQ - 2 Score 0 0 0 0  Altered sleeping 1 - - 0  Tired, decreased energy 1 - - 0  Change in appetite 0 - - 0  Feeling bad or failure about yourself  0 - - 0  Trouble concentrating 0 - - 0  Moving slowly or fidgety/restless 0 - - 0  Suicidal thoughts 0 - - 0  PHQ-9 Score 2 - - 0    The patient does not have a history of falls. I did complete a risk assessment for falls. A plan of care for falls was documented.   Past Medical History:  Past Medical History:  Diagnosis Date   CVA (cerebral infarction) 2005   ED (erectile dysfunction)    Hematuria    Hyperlipidemia    Hypertension     Surgical History:  Past Surgical History:  Procedure Laterality Date   clavicle surgery  1970   EYE SURGERY Left    left ear trauma     varicose veins      Medications:  Current Outpatient Medications on File Prior  to Visit  Medication Sig   acetaminophen (TYLENOL) 325 MG tablet Take 650 mg by mouth every 6 (six) hours as needed.   amLODipine (NORVASC) 5 MG tablet TAKE 1 TABLET EVERY DAY   aspirin 325 MG tablet Take 325 mg by mouth daily.   atorvastatin (LIPITOR) 20 MG tablet TAKE 1 TABLET (20 MG TOTAL) BY MOUTH DAILY.   lisinopril-hydrochlorothiazide (ZESTORETIC) 20-25 MG tablet TAKE 1 TABLET EVERY DAY   Omega 3 1000 MG CAPS Take 1,000 mg by mouth.   No current facility-administered medications on file prior to visit.     Allergies:  No Known Allergies  Social History:  Social History   Socioeconomic History   Marital status: Widowed    Spouse name: Not on file   Number of children: Not on file   Years of education: Not on file   Highest education level: High school graduate  Occupational History   Occupation: retired  Scientist, product/process development strain: Not hard at all   Food insecurity    Worry: Never true    Inability: Never true   Transportation needs  Medical: No    Non-medical: No  Tobacco Use   Smoking status: Current Every Day Smoker    Packs/day: 0.50    Years: 44.00    Pack years: 22.00    Types: Cigarettes    Last attempt to quit: 06/17/2016    Years since quitting: 2.3   Smokeless tobacco: Never Used   Tobacco comment: quit 06/17/2016, restarted smoking 03/2018  Substance and Sexual Activity   Alcohol use: Yes    Alcohol/week: 2.0 standard drinks    Types: 2 Cans of beer per week    Comment: on occasion   Drug use: No   Sexual activity: Not on file  Lifestyle   Physical activity    Days per week: 0 days    Minutes per session: 0 min   Stress: Not at all  Relationships   Social connections    Talks on phone: More than three times a week    Gets together: More than three times a week    Attends religious service: Never    Active member of club or organization: No    Attends meetings of clubs or organizations: Never     Relationship status: Widowed   Intimate partner violence    Fear of current or ex partner: No    Emotionally abused: No    Physically abused: No    Forced sexual activity: No  Other Topics Concern   Not on file  Social History Narrative   Not on file   Social History   Tobacco Use  Smoking Status Current Every Day Smoker   Packs/day: 0.50   Years: 44.00   Pack years: 22.00   Types: Cigarettes   Last attempt to quit: 06/17/2016   Years since quitting: 2.3  Smokeless Tobacco Never Used  Tobacco Comment   quit 06/17/2016, restarted smoking 03/2018   Social History   Substance and Sexual Activity  Alcohol Use Yes   Alcohol/week: 2.0 standard drinks   Types: 2 Cans of beer per week   Comment: on occasion    Family History:  Family History  Problem Relation Age of Onset   Diabetes Mother    Heart disease Father    Hypertension Father    Heart disease Brother     Past medical history, surgical history, medications, allergies, family history and social history reviewed with patient today and changes made to appropriate areas of the chart.   Review of Systems - General ROS: negative Psychological ROS: negative Ophthalmic ROS: negative ENT ROS: negative Allergy and Immunology ROS: negative Hematological and Lymphatic ROS: negative Endocrine ROS: negative Breast ROS: negative for breast lumps Respiratory ROS: no cough, shortness of breath, or wheezing Cardiovascular ROS: no chest pain or dyspnea on exertion Gastrointestinal ROS: no abdominal pain, change in bowel habits, or black or bloody stools Genito-Urinary ROS: no dysuria, trouble voiding, or hematuria Musculoskeletal ROS: negative Neurological ROS: no TIA or stroke symptoms Dermatological ROS: negative All other ROS negative except what is listed above and in the HPI.      Objective:    BP 127/74    Pulse 97    Temp 98.6 F (37 C) (Oral)    Ht 6' (1.829 m)    Wt 146 lb (66.2 kg)    SpO2 98%     BMI 19.80 kg/m   Wt Readings from Last 3 Encounters:  10/10/18 146 lb (66.2 kg)  04/08/18 148 lb 7 oz (67.3 kg)  10/30/17 155 lb (70.3 kg)  Physical Exam Vitals signs and nursing note reviewed.  Constitutional:      General: He is not in acute distress.    Appearance: He is well-developed.  HENT:     Head: Atraumatic.     Right Ear: Tympanic membrane and external ear normal.     Left Ear: Tympanic membrane and external ear normal.     Nose: Nose normal.     Mouth/Throat:     Mouth: Mucous membranes are moist.     Pharynx: Oropharynx is clear.  Eyes:     General: No scleral icterus.    Conjunctiva/sclera: Conjunctivae normal.     Pupils: Pupils are equal, round, and reactive to light.  Neck:     Musculoskeletal: Normal range of motion and neck supple.  Cardiovascular:     Rate and Rhythm: Normal rate and regular rhythm.     Heart sounds: Normal heart sounds. No murmur.  Pulmonary:     Effort: Pulmonary effort is normal. No respiratory distress.     Breath sounds: Normal breath sounds.  Abdominal:     General: Bowel sounds are normal. There is no distension.     Palpations: Abdomen is soft. There is no mass.     Tenderness: There is no abdominal tenderness. There is no guarding.  Genitourinary:    Prostate: Normal.  Musculoskeletal: Normal range of motion.        General: No tenderness.  Skin:    General: Skin is warm and dry.     Findings: No rash.  Neurological:     General: No focal deficit present.     Mental Status: He is alert and oriented to person, place, and time.     Deep Tendon Reflexes: Reflexes are normal and symmetric.  Psychiatric:        Mood and Affect: Mood normal.        Behavior: Behavior normal.        Thought Content: Thought content normal.        Judgment: Judgment normal.     Results for orders placed or performed in visit on 10/10/18  Microscopic Examination   URINE  Result Value Ref Range   WBC, UA None seen 0 - 5 /hpf   RBC 3-10  (A) 0 - 2 /hpf   Epithelial Cells (non renal) 0-10 0 - 10 /hpf   Bacteria, UA None seen None seen/Few  CBC with Differential/Platelet  Result Value Ref Range   WBC 8.7 3.4 - 10.8 x10E3/uL   RBC 4.24 4.14 - 5.80 x10E6/uL   Hemoglobin 13.4 13.0 - 17.7 g/dL   Hematocrit 16.137.8 09.637.5 - 51.0 %   MCV 89 79 - 97 fL   MCH 31.6 26.6 - 33.0 pg   MCHC 35.4 31.5 - 35.7 g/dL   RDW 04.512.3 40.911.6 - 81.115.4 %   Platelets 214 150 - 450 x10E3/uL   Neutrophils 63 Not Estab. %   Lymphs 26 Not Estab. %   Monocytes 7 Not Estab. %   Eos 2 Not Estab. %   Basos 1 Not Estab. %   Neutrophils Absolute 5.6 1.4 - 7.0 x10E3/uL   Lymphocytes Absolute 2.3 0.7 - 3.1 x10E3/uL   Monocytes Absolute 0.6 0.1 - 0.9 x10E3/uL   EOS (ABSOLUTE) 0.2 0.0 - 0.4 x10E3/uL   Basophils Absolute 0.1 0.0 - 0.2 x10E3/uL   Immature Granulocytes 1 Not Estab. %   Immature Grans (Abs) 0.0 0.0 - 0.1 x10E3/uL  Comprehensive metabolic panel  Result Value Ref Range  Glucose 97 65 - 99 mg/dL   BUN 21 8 - 27 mg/dL   Creatinine, Ser 1.611.41 (H) 0.76 - 1.27 mg/dL   GFR calc non Af Amer 51 (L) >59 mL/min/1.73   GFR calc Af Amer 59 (L) >59 mL/min/1.73   BUN/Creatinine Ratio 15 10 - 24   Sodium 134 134 - 144 mmol/L   Potassium 3.7 3.5 - 5.2 mmol/L   Chloride 98 96 - 106 mmol/L   CO2 21 20 - 29 mmol/L   Calcium 8.8 8.6 - 10.2 mg/dL   Total Protein 6.5 6.0 - 8.5 g/dL   Albumin 4.1 3.8 - 4.8 g/dL   Globulin, Total 2.4 1.5 - 4.5 g/dL   Albumin/Globulin Ratio 1.7 1.2 - 2.2   Bilirubin Total 0.4 0.0 - 1.2 mg/dL   Alkaline Phosphatase 61 39 - 117 IU/L   AST 17 0 - 40 IU/L   ALT 11 0 - 44 IU/L  Lipid Panel w/o Chol/HDL Ratio  Result Value Ref Range   Cholesterol, Total 124 100 - 199 mg/dL   Triglycerides 096160 (H) 0 - 149 mg/dL   HDL 48 >04>39 mg/dL   VLDL Cholesterol Cal 32 5 - 40 mg/dL   LDL Calculated 44 0 - 99 mg/dL  UA/M w/rflx Culture, Routine   Specimen: Urine   URINE  Result Value Ref Range   Specific Gravity, UA 1.010 1.005 - 1.030   pH, UA  5.0 5.0 - 7.5   Color, UA Yellow Yellow   Appearance Ur Clear Clear   Leukocytes,UA Negative Negative   Protein,UA Negative Negative/Trace   Glucose, UA Trace (A) Negative   Ketones, UA Negative Negative   RBC, UA 3+ (A) Negative   Bilirubin, UA Negative Negative   Urobilinogen, Ur 0.2 0.2 - 1.0 mg/dL   Nitrite, UA Negative Negative   Microscopic Examination See below:       Assessment & Plan:   Problem List Items Addressed This Visit      Cardiovascular and Mediastinum   TIA (transient ischemic attack)    No new sxs, continue good BP and chol control, aspirin, and good lifestyle control      Bilateral carotid artery disease (HCC)    Lifestyle factors, lipitor regimen. Recheck lipids today and adjust as needed        Respiratory   Chronic obstructive pulmonary disease (HCC)    Breathing stable without recent flares off inhalers        Genitourinary   Hypertensive renal disease - Primary    Recheck labs. BPs stable and WNL. Continue current regimen      Relevant Orders   CBC with Differential/Platelet (Completed)   Comprehensive metabolic panel (Completed)   UA/M w/rflx Culture, Routine (Completed)     Other   Hyperlipidemia, unspecified    Recheck lipids, adjust as needed. Lifestyle modifications reviewed      Relevant Orders   Lipid Panel w/o Chol/HDL Ratio (Completed)    Other Visit Diagnoses    Annual physical exam           Discussed aspirin prophylaxis for myocardial infarction prevention and decision was made to continue ASA  LABORATORY TESTING:  Health maintenance labs ordered today as discussed above.   The natural history of prostate cancer and ongoing controversy regarding screening and potential treatment outcomes of prostate cancer has been discussed with the patient. The meaning of a false positive PSA and a false negative PSA has been discussed. He indicates understanding of the limitations of  this screening test and wishes not to proceed  with screening PSA testing.   IMMUNIZATIONS:   - Tdap: Tetanus vaccination status reviewed: postponed. - Influenza: Postponed to flu season - Pneumovax: Up to date - Prevnar: Up to date - HPV: Not applicable - Zostavax vaccine: Up to date  SCREENING: - Colonoscopy: Up to date  Discussed with patient purpose of the colonoscopy is to detect colon cancer at curable precancerous or early stages   PATIENT COUNSELING:    Sexuality: Discussed sexually transmitted diseases, partner selection, use of condoms, avoidance of unintended pregnancy  and contraceptive alternatives.   Advised to avoid cigarette smoking.  I discussed with the patient that most people either abstain from alcohol or drink within safe limits (<=14/week and <=4 drinks/occasion for males, <=7/weeks and <= 3 drinks/occasion for females) and that the risk for alcohol disorders and other health effects rises proportionally with the number of drinks per week and how often a drinker exceeds daily limits.  Discussed cessation/primary prevention of drug use and availability of treatment for abuse.   Diet: Encouraged to adjust caloric intake to maintain  or achieve ideal body weight, to reduce intake of dietary saturated fat and total fat, to limit sodium intake by avoiding high sodium foods and not adding table salt, and to maintain adequate dietary potassium and calcium preferably from fresh fruits, vegetables, and low-fat dairy products.    stressed the importance of regular exercise  Injury prevention: Discussed safety belts, safety helmets, smoke detector, smoking near bedding or upholstery.   Dental health: Discussed importance of regular tooth brushing, flossing, and dental visits.   Follow up plan: NEXT PREVENTATIVE PHYSICAL DUE IN 1 YEAR. Return in about 6 months (around 04/12/2019) for 6 month f/u.

## 2018-10-11 LAB — CBC WITH DIFFERENTIAL/PLATELET
Basophils Absolute: 0.1 10*3/uL (ref 0.0–0.2)
Basos: 1 %
EOS (ABSOLUTE): 0.2 10*3/uL (ref 0.0–0.4)
Eos: 2 %
Hematocrit: 37.8 % (ref 37.5–51.0)
Hemoglobin: 13.4 g/dL (ref 13.0–17.7)
Immature Grans (Abs): 0 10*3/uL (ref 0.0–0.1)
Immature Granulocytes: 1 %
Lymphocytes Absolute: 2.3 10*3/uL (ref 0.7–3.1)
Lymphs: 26 %
MCH: 31.6 pg (ref 26.6–33.0)
MCHC: 35.4 g/dL (ref 31.5–35.7)
MCV: 89 fL (ref 79–97)
Monocytes Absolute: 0.6 10*3/uL (ref 0.1–0.9)
Monocytes: 7 %
Neutrophils Absolute: 5.6 10*3/uL (ref 1.4–7.0)
Neutrophils: 63 %
Platelets: 214 10*3/uL (ref 150–450)
RBC: 4.24 x10E6/uL (ref 4.14–5.80)
RDW: 12.3 % (ref 11.6–15.4)
WBC: 8.7 10*3/uL (ref 3.4–10.8)

## 2018-10-11 LAB — COMPREHENSIVE METABOLIC PANEL
ALT: 11 IU/L (ref 0–44)
AST: 17 IU/L (ref 0–40)
Albumin/Globulin Ratio: 1.7 (ref 1.2–2.2)
Albumin: 4.1 g/dL (ref 3.8–4.8)
Alkaline Phosphatase: 61 IU/L (ref 39–117)
BUN/Creatinine Ratio: 15 (ref 10–24)
BUN: 21 mg/dL (ref 8–27)
Bilirubin Total: 0.4 mg/dL (ref 0.0–1.2)
CO2: 21 mmol/L (ref 20–29)
Calcium: 8.8 mg/dL (ref 8.6–10.2)
Chloride: 98 mmol/L (ref 96–106)
Creatinine, Ser: 1.41 mg/dL — ABNORMAL HIGH (ref 0.76–1.27)
GFR calc Af Amer: 59 mL/min/{1.73_m2} — ABNORMAL LOW (ref >59)
GFR calc non Af Amer: 51 mL/min/{1.73_m2} — ABNORMAL LOW (ref >59)
Globulin, Total: 2.4 g/dL (ref 1.5–4.5)
Glucose: 97 mg/dL (ref 65–99)
Potassium: 3.7 mmol/L (ref 3.5–5.2)
Sodium: 134 mmol/L (ref 134–144)
Total Protein: 6.5 g/dL (ref 6.0–8.5)

## 2018-10-11 LAB — LIPID PANEL W/O CHOL/HDL RATIO
Cholesterol, Total: 124 mg/dL (ref 100–199)
HDL: 48 mg/dL (ref >39)
LDL Calculated: 44 mg/dL (ref 0–99)
Triglycerides: 160 mg/dL — ABNORMAL HIGH (ref 0–149)
VLDL Cholesterol Cal: 32 mg/dL (ref 5–40)

## 2018-10-13 ENCOUNTER — Encounter: Payer: Self-pay | Admitting: Family Medicine

## 2018-10-13 NOTE — Assessment & Plan Note (Signed)
Lifestyle factors, lipitor regimen. Recheck lipids today and adjust as needed

## 2018-10-13 NOTE — Assessment & Plan Note (Signed)
No new sxs, continue good BP and chol control, aspirin, and good lifestyle control

## 2018-10-13 NOTE — Assessment & Plan Note (Addendum)
Breathing stable without recent flares off inhalers

## 2018-10-14 NOTE — Assessment & Plan Note (Signed)
Recheck labs. BPs stable and WNL. Continue current regimen

## 2018-10-14 NOTE — Assessment & Plan Note (Signed)
Recheck lipids, adjust as needed. Lifestyle modifications reviewed 

## 2018-10-24 ENCOUNTER — Other Ambulatory Visit: Payer: Self-pay | Admitting: Family Medicine

## 2018-10-28 ENCOUNTER — Telehealth: Payer: Self-pay | Admitting: *Deleted

## 2018-10-28 NOTE — Telephone Encounter (Signed)
Patient has been notified that lung cancer screening CT scan is due currently or will be in near future. Confirmed that patient is within the appropriate age range, and asymptomatic, (no signs or symptoms of lung cancer). Patient denies illness that would prevent curative treatment for lung cancer if found. Verified smoking history (Current Smoker, 0.5ppd ). Patient is agreeable for CT scan being scheduled. After August 3rd, patient states that any day in the early afternoon will be fine.

## 2018-11-01 ENCOUNTER — Other Ambulatory Visit: Payer: Self-pay | Admitting: *Deleted

## 2018-11-01 DIAGNOSIS — Z122 Encounter for screening for malignant neoplasm of respiratory organs: Secondary | ICD-10-CM

## 2018-11-01 DIAGNOSIS — Z87891 Personal history of nicotine dependence: Secondary | ICD-10-CM

## 2018-11-05 ENCOUNTER — Other Ambulatory Visit: Payer: Self-pay

## 2018-11-05 ENCOUNTER — Ambulatory Visit
Admission: RE | Admit: 2018-11-05 | Discharge: 2018-11-05 | Disposition: A | Discharge status: Home / Self Care | Payer: Medicare PPO | Source: Ambulatory Visit | Attending: Nurse Practitioner | Admitting: Nurse Practitioner

## 2018-11-05 DIAGNOSIS — Z122 Encounter for screening for malignant neoplasm of respiratory organs: Secondary | ICD-10-CM | POA: Insufficient documentation

## 2018-11-05 DIAGNOSIS — Z87891 Personal history of nicotine dependence: Secondary | ICD-10-CM | POA: Diagnosis present

## 2018-11-06 ENCOUNTER — Telehealth: Payer: Self-pay | Admitting: *Deleted

## 2018-11-06 NOTE — Telephone Encounter (Signed)
Notified patient of LDCT lung cancer screening program results with recommendation for 12 month follow up imaging. Also notified of incidental findings noted below and is encouraged to discuss further with PCP who will receive a copy of this note and/or the CT report. Patient verbalizes understanding.   Patient is aware that he needs further evaluation of thoracic and abdominal aneurysms and message will be sent to PCP.  IMPRESSION: 1. Lung-RADS 2, benign appearance or behavior. Continue annual screening with low-dose chest CT without contrast in 12 months. 2. Thoracic and abdominal aortic aneurysms, as detailed above. The thoracic dilatation is similar to on the prior exam. The abdominal aneurysm is slightly progressive compared to the most recent abdominal CT of 03/16/2016. Recommend followup by abdomen and pelvis CTA in 6 months, and vascular surgery referral/consultation if not already obtained. This recommendation follows ACR consensus guidelines: White Paper of the ACR Incidental Findings Committee II on Vascular Findings. J Am Coll Radiol 2013; 10:789-794. Aortic aneurysm NOS (ICD10-I71.9) 3. Aortic atherosclerosis (ICD10-I70.0), coronary artery atherosclerosis and emphysema (ICD10-J43.9). 4. Right renal scarring.

## 2018-11-08 ENCOUNTER — Other Ambulatory Visit: Payer: Self-pay | Admitting: Family Medicine

## 2018-11-08 ENCOUNTER — Inpatient Hospital Stay: Admission: RE | Admit: 2018-11-08 | Payer: Medicare PPO | Source: Ambulatory Visit

## 2018-11-13 ENCOUNTER — Telehealth: Payer: Self-pay | Admitting: Family Medicine

## 2018-11-13 NOTE — Telephone Encounter (Signed)
Please let patient know that his recent CT scan showed some progression of known abdominal aortic aneurysm and he should follow up with his Vascular specialist for next steps in monitoring this. It looks like he's overdue per record review for follow up with them for his carotid stenosis so perhaps he can address both. Was to return in 1 year which would have been April.

## 2018-11-13 NOTE — Telephone Encounter (Signed)
-----   Message from Lieutenant Diego, RN sent at 11/06/2018 10:36 AM EDT ----- Regarding: lung screening results Parthenia Ames, Please see the CT results and recommendations regarding abdominal aneurysm.  Thanks, [employer]

## 2018-11-13 NOTE — Telephone Encounter (Signed)
Patient notified of Rachel's message. Provided patient with [facility]'s number to get f/up scheduled.

## 2018-12-06 ENCOUNTER — Other Ambulatory Visit (INDEPENDENT_AMBULATORY_CARE_PROVIDER_SITE_OTHER): Payer: Self-pay | Admitting: Vascular Surgery

## 2018-12-06 DIAGNOSIS — I679 Cerebrovascular disease, unspecified: Secondary | ICD-10-CM

## 2018-12-10 ENCOUNTER — Ambulatory Visit (INDEPENDENT_AMBULATORY_CARE_PROVIDER_SITE_OTHER): Payer: Medicare HMO | Admitting: Vascular Surgery

## 2018-12-10 ENCOUNTER — Other Ambulatory Visit: Payer: Self-pay

## 2018-12-10 ENCOUNTER — Ambulatory Visit (INDEPENDENT_AMBULATORY_CARE_PROVIDER_SITE_OTHER): Payer: Medicare PPO | Admitting: Nurse Practitioner

## 2018-12-10 ENCOUNTER — Encounter (INDEPENDENT_AMBULATORY_CARE_PROVIDER_SITE_OTHER): Payer: Self-pay

## 2018-12-10 ENCOUNTER — Ambulatory Visit (INDEPENDENT_AMBULATORY_CARE_PROVIDER_SITE_OTHER): Payer: Medicare PPO

## 2018-12-10 ENCOUNTER — Encounter (INDEPENDENT_AMBULATORY_CARE_PROVIDER_SITE_OTHER): Payer: Self-pay | Admitting: Nurse Practitioner

## 2018-12-10 VITALS — BP 136/69 | HR 95 | Resp 16 | Ht 72.0 in | Wt 146.8 lb

## 2018-12-10 DIAGNOSIS — I6523 Occlusion and stenosis of bilateral carotid arteries: Secondary | ICD-10-CM | POA: Diagnosis not present

## 2018-12-10 DIAGNOSIS — E782 Mixed hyperlipidemia: Secondary | ICD-10-CM | POA: Diagnosis not present

## 2018-12-10 DIAGNOSIS — I679 Cerebrovascular disease, unspecified: Secondary | ICD-10-CM

## 2018-12-10 DIAGNOSIS — J449 Chronic obstructive pulmonary disease, unspecified: Secondary | ICD-10-CM | POA: Diagnosis not present

## 2018-12-10 NOTE — Progress Notes (Signed)
SUBJECTIVE:  Patient ID: Aaron Boyd, male    DOB: 01-May-1950, 68 y.o.   MRN: [id number] Chief Complaint  Patient presents with  . Follow-up    ultrasound follow up    HPI  Aaron Boyd is a 68 y.o. male The patient is seen for follow up evaluation of carotid stenosis. The carotid stenosis followed by ultrasound.   The patient denies amaurosis fugax. There is no recent history of TIA symptoms or focal motor deficits. There is no prior documented CVA.  The patient is taking enteric-coated aspirin 81 mg daily.  There is no history of migraine headaches. There is no history of seizures.  The patient has a history of coronary artery disease, no recent episodes of angina or shortness of breath. The patient denies PAD or claudication symptoms. There is a history of hyperlipidemia which is being treated with a statin.    Carotid Duplex done today shows an occluded left carotid artery, which was known.  The right internal carotid artery showed stenosis of 40 to 59%.  This is consistent with his CT scan a little over a year ago. Past Medical History:  Diagnosis Date  . CVA (cerebral infarction) 2005  . ED (erectile dysfunction)   . Hematuria   . Hyperlipidemia   . Hypertension     Past Surgical History:  Procedure Laterality Date  . clavicle surgery  1970  . EYE SURGERY Left   . left ear trauma    . varicose veins      Social History   Socioeconomic History  . Marital status: Widowed    Spouse name: Not on file  . Number of children: Not on file  . Years of education: Not on file  . Highest education level: High school graduate  Occupational History  . Occupation: retired  Scientific laboratory technician  . Financial resource strain: Not hard at all  . Food insecurity    Worry: Never true    Inability: Never true  . Transportation needs    Medical: No    Non-medical: No  Tobacco Use  . Smoking status: Current Every Day Smoker    Packs/day: 0.50    Years: 44.00    Pack  years: 22.00    Types: Cigarettes    Last attempt to quit: 06/17/2016    Years since quitting: 2.4  . Smokeless tobacco: Never Used  . Tobacco comment: quit 06/17/2016, restarted smoking 03/2018  Substance and Sexual Activity  . Alcohol use: Yes    Alcohol/week: 2.0 standard drinks    Types: 2 Cans of beer per week    Comment: on occasion  . Drug use: No  . Sexual activity: Not on file  Lifestyle  . Physical activity    Days per week: 0 days    Minutes per session: 0 min  . Stress: Not at all  Relationships  . Social connections    Talks on phone: More than three times a week    Gets together: More than three times a week    Attends religious service: Never    Active member of club or organization: No    Attends meetings of clubs or organizations: Never    Relationship status: Widowed  . Intimate partner violence    Fear of current or ex partner: No    Emotionally abused: No    Physically abused: No    Forced sexual activity: No  Other Topics Concern  . Not on file  Social History Narrative  .  Not on file    Family History  Problem Relation Age of Onset  . Diabetes Mother   . Heart disease Father   . Hypertension Father   . Heart disease Brother     No Known Allergies   Review of Systems   Review of Systems: Negative Unless Checked Constitutional: [] Weight loss  [] Fever  [] Chills Cardiac: [] Chest pain   []  Atrial Fibrillation  [] Palpitations   [] Shortness of breath when laying flat   [] Shortness of breath with exertion. [] Shortness of breath at rest Vascular:  [] Pain in legs with walking   [] Pain in legs with standing [] Pain in legs when laying flat   [] Claudication    [] Pain in feet when laying flat    [] History of DVT   [] Phlebitis   [] Swelling in legs   [x] Varicose veins   [] Non-healing ulcers Pulmonary:   [] Uses home oxygen   [] Productive cough   [] Hemoptysis   [] Wheeze  [x] COPD   [] Asthma Neurologic:  [] Dizziness   [] Seizures  [] Blackouts [] History of stroke    [] History of TIA  [] Aphasia   [] Temporary Blindness   [] Weakness or numbness in arm   [] Weakness or numbness in leg Musculoskeletal:   [] Joint swelling   [] Joint pain   [] Low back pain  []  History of Knee Replacement [] Arthritis [] back Surgeries  []  Spinal Stenosis    Hematologic:  [] Easy bruising  [] Easy bleeding   [] Hypercoagulable state   [] Anemic Gastrointestinal:  [] Diarrhea   [] Vomiting  [] Gastroesophageal reflux/heartburn   [] Difficulty swallowing. [] Abdominal pain Genitourinary:  [] Chronic kidney disease   [] Difficult urination  [] Anuric   [] Blood in urine [] Frequent urination  [] Burning with urination   [] Hematuria Skin:  [] Rashes   [] Ulcers [] Wounds Psychological:  [] History of anxiety   []  History of major depression  []  Memory Difficulties      OBJECTIVE:   Physical Exam  BP 136/69 (BP Location: Right Arm)   Pulse 95   Resp 16   Ht 6' (1.829 m)   Wt 146 lb 12.8 oz (66.6 kg)   BMI 19.91 kg/m   Gen: WD/WN, NAD Head: [place]/AT, No temporalis wasting.  Ear/Nose/Throat: Hearing grossly intact, nares w/o erythema or drainage Eyes: PER, EOMI, sclera nonicteric.  Neck: Supple, no masses.  No JVD.  Pulmonary:  Good air movement, no use of accessory muscles.  Cardiac: RRR Vascular:  Vessel Right Left  Radial Palpable Palpable   Gastrointestinal: soft, non-distended. No guarding/no peritoneal signs.  Musculoskeletal: M/S 5/5 throughout.  No deformity or atrophy.  Neurologic: Pain and light touch intact in extremities.  Symmetrical.  Speech is fluent. Motor exam as listed above. Psychiatric: Judgment intact, Mood & affect appropriate for pt's clinical situation. Dermatologic: No Venous rashes. No Ulcers Noted.  No changes consistent with cellulitis. Lymph : No Cervical lymphadenopathy, no lichenification or skin changes of chronic lymphedema.       ASSESSMENT AND PLAN:  1. Bilateral carotid artery stenosis Recommend:  Given the patient's asymptomatic subcritical stenosis no  further invasive testing or surgery at this time.   Carotid Duplex done today shows an occluded left carotid artery, which was known.  The right internal carotid artery showed stenosis of 40 to 59%.  This is consistent with his CT scan a little over a year ago.  Continue antiplatelet therapy as prescribed Continue management of CAD, HTN and Hyperlipidemia Healthy heart diet,  encouraged exercise at least 4 times per week Follow up in 12 months with duplex ultrasound and physical exam  2. Mixed hyperlipidemia Continue statin as ordered and reviewed, no changes at this time   3. Chronic obstructive pulmonary disease, unspecified COPD type (HCC) Continue pulmonary medications and aerosols as already ordered, these medications have been reviewed and there are no changes at this time.     Current Outpatient Medications on File Prior to Visit  Medication Sig Dispense Refill  . acetaminophen (TYLENOL) 325 MG tablet Take 650 mg by mouth every 6 (six) hours as needed.    Marland Kitchen. amLODipine (NORVASC) 5 MG tablet TAKE 1 TABLET EVERY DAY 90 tablet 1  . aspirin 325 MG tablet Take 325 mg by mouth daily.    Marland Kitchen. atorvastatin (LIPITOR) 20 MG tablet TAKE 1 TABLET (20 MG TOTAL) DAILY. 90 tablet 1  . lisinopril-hydrochlorothiazide (ZESTORETIC) 20-25 MG tablet TAKE 1 TABLET EVERY DAY 90 tablet 1  . Omega 3 1000 MG CAPS Take 1,000 mg by mouth.     No current facility-administered medications on file prior to visit.     There are no Patient Instructions on file for this visit. No follow-ups on file.   Georgiana SpinnerFallon E Donis Pinder, NP  This note was completed with Office managerDragon Dictation.  Any errors are purely unintentional.

## 2019-03-29 ENCOUNTER — Other Ambulatory Visit: Payer: Self-pay | Admitting: Family Medicine

## 2019-04-16 ENCOUNTER — Encounter: Payer: Self-pay | Admitting: Family Medicine

## 2019-04-16 ENCOUNTER — Other Ambulatory Visit: Payer: Self-pay

## 2019-04-16 ENCOUNTER — Ambulatory Visit (INDEPENDENT_AMBULATORY_CARE_PROVIDER_SITE_OTHER): Payer: Medicare HMO | Admitting: Family Medicine

## 2019-04-16 VITALS — BP 107/68 | HR 88 | Temp 97.7°F | Ht 72.0 in | Wt 147.0 lb

## 2019-04-16 DIAGNOSIS — E782 Mixed hyperlipidemia: Secondary | ICD-10-CM | POA: Diagnosis not present

## 2019-04-16 DIAGNOSIS — Z23 Encounter for immunization: Secondary | ICD-10-CM | POA: Diagnosis not present

## 2019-04-16 DIAGNOSIS — J449 Chronic obstructive pulmonary disease, unspecified: Secondary | ICD-10-CM | POA: Diagnosis not present

## 2019-04-16 DIAGNOSIS — Z8673 Personal history of transient ischemic attack (TIA), and cerebral infarction without residual deficits: Secondary | ICD-10-CM | POA: Diagnosis not present

## 2019-04-16 DIAGNOSIS — I129 Hypertensive chronic kidney disease with stage 1 through stage 4 chronic kidney disease, or unspecified chronic kidney disease: Secondary | ICD-10-CM

## 2019-04-16 MED ORDER — ATORVASTATIN CALCIUM 20 MG PO TABS: ORAL_TABLET | ORAL | 1 refills | Status: DC

## 2019-04-16 MED ORDER — LISINOPRIL-HYDROCHLOROTHIAZIDE 20-25 MG PO TABS: 1.0000 | ORAL_TABLET | Freq: Every day | ORAL | 1 refills | Status: DC

## 2019-04-16 MED ORDER — ALBUTEROL SULFATE HFA 108 (90 BASE) MCG/ACT IN AERS: 2.0000 | INHALATION_SPRAY | Freq: Four times a day (QID) | RESPIRATORY_TRACT | 0 refills | Status: DC | PRN

## 2019-04-16 NOTE — Progress Notes (Signed)
BP 107/68   Pulse 88   Temp 97.7 F (36.5 C) (Oral)   Ht 6' (1.829 m)   Wt 147 lb (66.7 kg)   SpO2 97%   BMI 19.94 kg/m    Subjective:    Patient ID: Aaron Boyd, male    DOB: 1950/08/27, 69 y.o.   MRN: [id number]  HPI: SHAHIL SPEEGLE is a 69 y.o. male  Chief Complaint  Patient presents with  . Hypertension  . Hyperlipidemia   Patient presenting today for 6 month f/u chronic conditions. Doing well, no new concerns today. Tolerating his medications well, taking faithfully. Denies CP, SOB, HAs, dizziness, myalgias, claudication. Home BPs running below 120/80 typically. Trying to eat well and stay active.   Relevant past medical, surgical, family and social history reviewed and updated as indicated. Interim medical history since our last visit reviewed. Allergies and medications reviewed and updated.  Review of Systems  Per HPI unless specifically indicated above     Objective:    BP 107/68   Pulse 88   Temp 97.7 F (36.5 C) (Oral)   Ht 6' (1.829 m)   Wt 147 lb (66.7 kg)   SpO2 97%   BMI 19.94 kg/m   Wt Readings from Last 3 Encounters:  04/16/19 147 lb (66.7 kg)  12/10/18 146 lb 12.8 oz (66.6 kg)  11/05/18 145 lb (65.8 kg)    Physical Exam Vitals and nursing note reviewed.  Constitutional:      Appearance: Normal appearance.  HENT:     Head: Atraumatic.  Eyes:     Extraocular Movements: Extraocular movements intact.     Conjunctiva/sclera: Conjunctivae normal.  Cardiovascular:     Rate and Rhythm: Normal rate and regular rhythm.  Pulmonary:     Effort: Pulmonary effort is normal.     Breath sounds: Normal breath sounds.  Musculoskeletal:        General: Normal range of motion.     Cervical back: Normal range of motion and neck supple.  Skin:    General: Skin is warm and dry.  Neurological:     General: No focal deficit present.     Mental Status: He is oriented to person, place, and time.  Psychiatric:        Mood and Affect: Mood normal.         Thought Content: Thought content normal.        Judgment: Judgment normal.     Results for orders placed or performed in visit on 04/16/19  Comprehensive metabolic panel  Result Value Ref Range   Glucose 80 65 - 99 mg/dL   BUN 19 8 - 27 mg/dL   Creatinine, Ser 7.49 (H) 0.76 - 1.27 mg/dL   GFR calc non Af Amer 49 (L) >59 mL/min/1.73   GFR calc Af Amer 57 (L) >59 mL/min/1.73   BUN/Creatinine Ratio 13 10 - 24   Sodium 138 134 - 144 mmol/L   Potassium 4.1 3.5 - 5.2 mmol/L   Chloride 101 96 - 106 mmol/L   CO2 24 20 - 29 mmol/L   Calcium 8.9 8.6 - 10.2 mg/dL   Total Protein 6.5 6.0 - 8.5 g/dL   Albumin 4.0 3.8 - 4.8 g/dL   Globulin, Total 2.5 1.5 - 4.5 g/dL   Albumin/Globulin Ratio 1.6 1.2 - 2.2   Bilirubin Total 0.3 0.0 - 1.2 mg/dL   Alkaline Phosphatase 68 39 - 117 IU/L   AST 14 0 - 40 IU/L   ALT  9 0 - 44 IU/L  Lipid Panel w/o Chol/HDL Ratio out  Result Value Ref Range   Cholesterol, Total 120 100 - 199 mg/dL   Triglycerides 116 0 - 149 mg/dL   HDL 53 >39 mg/dL   VLDL Cholesterol Cal 21 5 - 40 mg/dL   LDL Chol Calc (NIH) 46 0 - 99 mg/dL      Assessment & Plan:   Problem List Items Addressed This Visit      Respiratory   Chronic obstructive pulmonary disease (HCC)    Stable off inhalers, continue to monitor      Relevant Medications   albuterol (VENTOLIN HFA) 108 (90 Base) MCG/ACT inhaler     Genitourinary   Hypertensive renal disease    BPs stable and WNL, recheck labs and continue current regimen      Relevant Orders   Comprehensive metabolic panel (Completed)     Other   Hyperlipidemia, unspecified    Recheck lipids, adjust as needed. Continue current regimen      Relevant Medications   lisinopril-hydrochlorothiazide (ZESTORETIC) 20-25 MG tablet   atorvastatin (LIPITOR) 20 MG tablet   Other Relevant Orders   Lipid Panel w/o Chol/HDL Ratio out (Completed)   Hx of transient ischemic attack (TIA)    No continued deficits. Continue risk factor  management       Other Visit Diagnoses    Flu vaccine need    -  Primary   Relevant Orders   Flu Vaccine QUAD High Dose(Fluad) (Completed)       Follow up plan: Return in about 6 months (around 10/14/2019) for CPE.

## 2019-04-17 LAB — COMPREHENSIVE METABOLIC PANEL
ALT: 9 IU/L (ref 0–44)
AST: 14 IU/L (ref 0–40)
Albumin/Globulin Ratio: 1.6 (ref 1.2–2.2)
Albumin: 4 g/dL (ref 3.8–4.8)
Alkaline Phosphatase: 68 IU/L (ref 39–117)
BUN/Creatinine Ratio: 13 (ref 10–24)
BUN: 19 mg/dL (ref 8–27)
Bilirubin Total: 0.3 mg/dL (ref 0.0–1.2)
CO2: 24 mmol/L (ref 20–29)
Calcium: 8.9 mg/dL (ref 8.6–10.2)
Chloride: 101 mmol/L (ref 96–106)
Creatinine, Ser: 1.45 mg/dL — ABNORMAL HIGH (ref 0.76–1.27)
GFR calc Af Amer: 57 mL/min/{1.73_m2} — ABNORMAL LOW (ref >59)
GFR calc non Af Amer: 49 mL/min/{1.73_m2} — ABNORMAL LOW (ref >59)
Globulin, Total: 2.5 g/dL (ref 1.5–4.5)
Glucose: 80 mg/dL (ref 65–99)
Potassium: 4.1 mmol/L (ref 3.5–5.2)
Sodium: 138 mmol/L (ref 134–144)
Total Protein: 6.5 g/dL (ref 6.0–8.5)

## 2019-04-17 LAB — LIPID PANEL W/O CHOL/HDL RATIO
Cholesterol, Total: 120 mg/dL (ref 100–199)
HDL: 53 mg/dL (ref >39)
LDL Chol Calc (NIH): 46 mg/dL (ref 0–99)
Triglycerides: 116 mg/dL (ref 0–149)
VLDL Cholesterol Cal: 21 mg/dL (ref 5–40)

## 2019-04-21 ENCOUNTER — Encounter: Payer: Self-pay | Admitting: Family Medicine

## 2019-04-21 NOTE — Assessment & Plan Note (Signed)
Stable off inhalers, continue to monitor 

## 2019-04-21 NOTE — Assessment & Plan Note (Signed)
No continued deficits. Continue risk factor management

## 2019-04-21 NOTE — Assessment & Plan Note (Signed)
BPs stable and WNL, recheck labs and continue current regimen

## 2019-04-21 NOTE — Assessment & Plan Note (Signed)
Recheck lipids, adjust as needed. Continue current regimen 

## 2019-10-10 ENCOUNTER — Encounter: Payer: Self-pay | Admitting: Family Medicine

## 2019-10-10 ENCOUNTER — Other Ambulatory Visit: Payer: Self-pay

## 2019-10-10 ENCOUNTER — Ambulatory Visit (INDEPENDENT_AMBULATORY_CARE_PROVIDER_SITE_OTHER): Payer: Medicare HMO | Admitting: Family Medicine

## 2019-10-10 VITALS — BP 146/72 | HR 87 | Temp 98.5°F | Ht 72.0 in | Wt 148.0 lb

## 2019-10-10 DIAGNOSIS — I6523 Occlusion and stenosis of bilateral carotid arteries: Secondary | ICD-10-CM

## 2019-10-10 DIAGNOSIS — Z125 Encounter for screening for malignant neoplasm of prostate: Secondary | ICD-10-CM

## 2019-10-10 DIAGNOSIS — S61411A Laceration without foreign body of right hand, initial encounter: Secondary | ICD-10-CM | POA: Diagnosis not present

## 2019-10-10 DIAGNOSIS — I129 Hypertensive chronic kidney disease with stage 1 through stage 4 chronic kidney disease, or unspecified chronic kidney disease: Secondary | ICD-10-CM | POA: Diagnosis not present

## 2019-10-10 DIAGNOSIS — Z8673 Personal history of transient ischemic attack (TIA), and cerebral infarction without residual deficits: Secondary | ICD-10-CM | POA: Diagnosis not present

## 2019-10-10 DIAGNOSIS — E782 Mixed hyperlipidemia: Secondary | ICD-10-CM | POA: Diagnosis not present

## 2019-10-10 DIAGNOSIS — Z0000 Encounter for general adult medical examination without abnormal findings: Secondary | ICD-10-CM | POA: Diagnosis not present

## 2019-10-10 DIAGNOSIS — J449 Chronic obstructive pulmonary disease, unspecified: Secondary | ICD-10-CM | POA: Diagnosis not present

## 2019-10-10 DIAGNOSIS — Z23 Encounter for immunization: Secondary | ICD-10-CM | POA: Diagnosis not present

## 2019-10-10 LAB — MICROSCOPIC EXAMINATION
Bacteria, UA: NONE SEEN
WBC, UA: NONE SEEN /hpf (ref 0–5)

## 2019-10-10 LAB — UA/M W/RFLX CULTURE, ROUTINE
Bilirubin, UA: NEGATIVE
Glucose, UA: NEGATIVE
Ketones, UA: NEGATIVE
Leukocytes,UA: NEGATIVE
Nitrite, UA: NEGATIVE
Protein,UA: NEGATIVE
Specific Gravity, UA: 1.015 (ref 1.005–1.030)
Urobilinogen, Ur: 0.2 mg/dL (ref 0.2–1.0)
pH, UA: 6.5 (ref 5.0–7.5)

## 2019-10-10 MED ORDER — LISINOPRIL-HYDROCHLOROTHIAZIDE 20-25 MG PO TABS: 1.0000 | ORAL_TABLET | Freq: Every day | ORAL | 1 refills | Status: DC

## 2019-10-10 MED ORDER — ALBUTEROL SULFATE HFA 108 (90 BASE) MCG/ACT IN AERS: 2.0000 | INHALATION_SPRAY | Freq: Four times a day (QID) | RESPIRATORY_TRACT | 0 refills | Status: DC | PRN

## 2019-10-10 MED ORDER — ATORVASTATIN CALCIUM 20 MG PO TABS: ORAL_TABLET | ORAL | 1 refills | Status: DC

## 2019-10-10 MED ORDER — AMLODIPINE BESYLATE 5 MG PO TABS: 5.0000 mg | ORAL_TABLET | Freq: Every day | ORAL | 1 refills | Status: DC

## 2019-10-10 NOTE — Progress Notes (Signed)
BP (!) 146/72   Pulse 87   Temp 98.5 F (36.9 C) (Oral)   Ht 6' (1.829 m)   Wt 148 lb (67.1 kg)   SpO2 98%   BMI 20.07 kg/m    Subjective:    Patient ID: Aaron Boyd, male    DOB: 08-09-1950, 69 y.o.   MRN: [id number]  HPI: Aaron Boyd is a 69 y.o. male presenting on 10/10/2019 for comprehensive medical examination. Current medical complaints include:see below  HTN - Taking medications faithfully without side effects. Home BPs running below 140s/90s. Denies CP, SOB, HAs, dizziness. Not following strict diet or exerise regimen. Has not taken his medicine today and thinks that's why it's a bit high today.    HLD - on lipitor, tolerating well without myalgias. Denies claudication, CP, SOB.   Hx of TIA - no recent issues, no lasting deficits noted from event. On asa and trying to keep risk factors under control.   COPD - on albuterol prn, no recent exacerbations.   Right hand laceration since this morning. Bleeding well controlled. Not UTD on Td. Keeping clean and covered with neosporin.    He currently lives with: Interim Problems from his last visit: no  Depression Screen done today and results listed below:  Depression screen [facility] 2/9 10/10/2019 10/10/2018 10/07/2018 10/03/2017 08/08/2016  Decreased Interest 1 0 0 0 0  Down, Depressed, Hopeless 0 0 0 0 0  PHQ - 2 Score 1 0 0 0 0  Altered sleeping 0 1 - - 0  Tired, decreased energy 2 1 - - 0  Change in appetite 0 0 - - 0  Feeling bad or failure about yourself  0 0 - - 0  Trouble concentrating 0 0 - - 0  Moving slowly or fidgety/restless 0 0 - - 0  Suicidal thoughts 0 0 - - 0  PHQ-9 Score 3 2 - - 0    The patient does not have a history of falls. I did complete a risk assessment for falls. A plan of care for falls was documented.   Past Medical History:  Past Medical History:  Diagnosis Date  . CVA (cerebral infarction) 2005  . ED (erectile dysfunction)   . Hematuria   . Hyperlipidemia   . Hypertension      Surgical History:  Past Surgical History:  Procedure Laterality Date  . clavicle surgery  1970  . EYE SURGERY Left   . left ear trauma    . varicose veins      Medications:  Current Outpatient Medications on File Prior to Visit  Medication Sig  . acetaminophen (TYLENOL) 325 MG tablet Take 650 mg by mouth every 6 (six) hours as needed.  Marland Kitchen aspirin 325 MG tablet Take 325 mg by mouth daily.   No current facility-administered medications on file prior to visit.    Allergies:  No Known Allergies  Social History:  Social History   Socioeconomic History  . Marital status: Widowed    Spouse name: Not on file  . Number of children: Not on file  . Years of education: Not on file  . Highest education level: High school graduate  Occupational History  . Occupation: retired  Tobacco Use  . Smoking status: Current Every Day Smoker    Packs/day: 0.50    Years: 44.00    Pack years: 22.00    Types: Cigarettes    Last attempt to quit: 06/17/2016    Years since quitting: 3.3  .  Smokeless tobacco: Never Used  . Tobacco comment: quit 06/17/2016, restarted smoking 03/2018  Vaping Use  . Vaping Use: Former  . Quit date: 05/04/2017  Substance and Sexual Activity  . Alcohol use: Yes    Alcohol/week: 2.0 standard drinks    Types: 2 Cans of beer per week    Comment: on occasion  . Drug use: No  . Sexual activity: Not on file  Other Topics Concern  . Not on file  Social History Narrative  . Not on file   Social Determinants of Health   Financial Resource Strain:   . Difficulty of Paying Living Expenses:   Food Insecurity:   . Worried About Programme researcher, broadcasting/film/video in the Last Year:   . Barista in the Last Year:   Transportation Needs:   . Freight forwarder (Medical):   Marland Kitchen Lack of Transportation (Non-Medical):   Physical Activity:   . Days of Exercise per Week:   . Minutes of Exercise per Session:   Stress:   . Feeling of Stress :   Social Connections:   .  Frequency of Communication with Friends and Family:   . Frequency of Social Gatherings with Friends and Family:   . Attends Religious Services:   . Active Member of Clubs or Organizations:   . Attends Banker Meetings:   Marland Kitchen Marital Status:   Intimate Partner Violence:   . Fear of Current or Ex-Partner:   . Emotionally Abused:   Marland Kitchen Physically Abused:   . Sexually Abused:    Social History   Tobacco Use  Smoking Status Current Every Day Smoker  . Packs/day: 0.50  . Years: 44.00  . Pack years: 22.00  . Types: Cigarettes  . Last attempt to quit: 06/17/2016  . Years since quitting: 3.3  Smokeless Tobacco Never Used  Tobacco Comment   quit 06/17/2016, restarted smoking 03/2018   Social History   Substance and Sexual Activity  Alcohol Use Yes  . Alcohol/week: 2.0 standard drinks  . Types: 2 Cans of beer per week   Comment: on occasion    Family History:  Family History  Problem Relation Age of Onset  . Diabetes Mother   . Heart disease Father   . Hypertension Father   . Heart disease Brother     Past medical history, surgical history, medications, allergies, family history and social history reviewed with patient today and changes made to appropriate areas of the chart.   Review of Systems - General ROS: negative Psychological ROS: negative Ophthalmic ROS: negative ENT ROS: negative Allergy and Immunology ROS: negative Hematological and Lymphatic ROS: negative Endocrine ROS: negative Breast ROS: negative for breast lumps Respiratory ROS: no cough, shortness of breath, or wheezing Cardiovascular ROS: no chest pain or dyspnea on exertion Gastrointestinal ROS: no abdominal pain, change in bowel habits, or black or bloody stools Genito-Urinary ROS: no dysuria, trouble voiding, or hematuria Musculoskeletal ROS: negative Neurological ROS: no TIA or stroke symptoms Dermatological ROS: negative All other ROS negative except what is listed above and in the HPI.       Objective:    BP (!) 146/72   Pulse 87   Temp 98.5 F (36.9 C) (Oral)   Ht 6' (1.829 m)   Wt 148 lb (67.1 kg)   SpO2 98%   BMI 20.07 kg/m   Wt Readings from Last 3 Encounters:  10/10/19 148 lb (67.1 kg)  04/16/19 147 lb (66.7 kg)  12/10/18 146 lb 12.8  oz (66.6 kg)    Physical Exam Vitals and nursing note reviewed.  Constitutional:      Appearance: Normal appearance.  HENT:     Head: Atraumatic.  Eyes:     Extraocular Movements: Extraocular movements intact.     Conjunctiva/sclera: Conjunctivae normal.  Cardiovascular:     Rate and Rhythm: Normal rate and regular rhythm.  Pulmonary:     Effort: Pulmonary effort is normal.     Breath sounds: Normal breath sounds.  Musculoskeletal:        General: Normal range of motion.     Cervical back: Normal range of motion and neck supple.  Skin:    General: Skin is warm and dry.  Neurological:     General: No focal deficit present.     Mental Status: He is oriented to person, place, and time.  Psychiatric:        Mood and Affect: Mood normal.        Thought Content: Thought content normal.        Judgment: Judgment normal.     Results for orders placed or performed in visit on 10/10/19  Microscopic Examination   Urine  Result Value Ref Range   WBC, UA None seen 0 - 5 /hpf   RBC 3-10 (A) 0 - 2 /hpf   Epithelial Cells (non renal) 0-10 0 - 10 /hpf   Bacteria, UA None seen None seen/Few  CBC with Differential/Platelet  Result Value Ref Range   WBC 8.6 3.4 - 10.8 x10E3/uL   RBC 4.84 4.14 - 5.80 x10E6/uL   Hemoglobin 11.5 (L) 13.0 - 17.7 g/dL   Hematocrit 22.0 (L) 25.4 - 51.0 %   MCV 77 (L) 79 - 97 fL   MCH 23.8 (L) 26.6 - 33.0 pg   MCHC 31.0 (L) 31 - 35 g/dL   RDW 27.0 (H) 62.3 - 76.2 %   Platelets 291 150 - 450 x10E3/uL   Neutrophils 64 Not Estab. %   Lymphs 27 Not Estab. %   Monocytes 6 Not Estab. %   Eos 2 Not Estab. %   Basos 1 Not Estab. %   Neutrophils Absolute 5.4 1 - 7 x10E3/uL   Lymphocytes  Absolute 2.3 0 - 3 x10E3/uL   Monocytes Absolute 0.5 0 - 0 x10E3/uL   EOS (ABSOLUTE) 0.2 0.0 - 0.4 x10E3/uL   Basophils Absolute 0.1 0 - 0 x10E3/uL   Immature Granulocytes 0 Not Estab. %   Immature Grans (Abs) 0.0 0.0 - 0.1 x10E3/uL  Comprehensive metabolic panel  Result Value Ref Range   Glucose 85 65 - 99 mg/dL   BUN 22 8 - 27 mg/dL   Creatinine, Ser 8.31 (H) 0.76 - 1.27 mg/dL   GFR calc non Af Amer 46 (L) >59 mL/min/1.73   GFR calc Af Amer 54 (L) >59 mL/min/1.73   BUN/Creatinine Ratio 14 10 - 24   Sodium 135 134 - 144 mmol/L   Potassium 4.8 3.5 - 5.2 mmol/L   Chloride 99 96 - 106 mmol/L   CO2 24 20 - 29 mmol/L   Calcium 9.2 8.6 - 10.2 mg/dL   Total Protein 7.4 6.0 - 8.5 g/dL   Albumin 4.3 3.8 - 4.8 g/dL   Globulin, Total 3.1 1.5 - 4.5 g/dL   Albumin/Globulin Ratio 1.4 1.2 - 2.2   Bilirubin Total 0.2 0.0 - 1.2 mg/dL   Alkaline Phosphatase 69 48 - 121 IU/L   AST 16 0 - 40 IU/L   ALT 11 0 -  44 IU/L  Lipid Panel w/o Chol/HDL Ratio  Result Value Ref Range   Cholesterol, Total 166 100 - 199 mg/dL   Triglycerides 73 0 - 149 mg/dL   HDL 49 >69>39 mg/dL   VLDL Cholesterol Cal 14 5 - 40 mg/dL   LDL Chol Calc (NIH) 629103 (H) 0 - 99 mg/dL  UA/M w/rflx Culture, Routine   Specimen: Urine, Clean Catch   Urine  Result Value Ref Range   Specific Gravity, UA 1.015 1.005 - 1.030   pH, UA 6.5 5.0 - 7.5   Color, UA Yellow Yellow   Appearance Ur Clear Clear   Leukocytes,UA Negative Negative   Protein,UA Negative Negative/Trace   Glucose, UA Negative Negative   Ketones, UA Negative Negative   RBC, UA 2+ (A) Negative   Bilirubin, UA Negative Negative   Urobilinogen, Ur 0.2 0.2 - 1.0 mg/dL   Nitrite, UA Negative Negative   Microscopic Examination See below:   PSA  Result Value Ref Range   Prostate Specific Ag, Serum 1.6 0.0 - 4.0 ng/mL      Assessment & Plan:   Problem List Items Addressed This Visit      Cardiovascular and Mediastinum   Bilateral carotid artery disease (HCC) -  Primary    Recheck lipids, lifestyle modifications and current regimen to be continued      Relevant Medications   amLODipine (NORVASC) 5 MG tablet   atorvastatin (LIPITOR) 20 MG tablet   lisinopril-hydrochlorothiazide (ZESTORETIC) 20-25 MG tablet     Respiratory   Chronic obstructive pulmonary disease (HCC)    Stable and well controlled, continue current regimen      Relevant Medications   albuterol (VENTOLIN HFA) 108 (90 Base) MCG/ACT inhaler     Genitourinary   Hypertensive renal disease    Mildly elevated, but has not taken medication. Continue home monitoring on current regimen and call with persistent elevated readings.       Relevant Orders   CBC with Differential/Platelet (Completed)   Comprehensive metabolic panel (Completed)   UA/M w/rflx Culture, Routine (Completed)     Other   Hyperlipidemia, unspecified    Recheck lipids, continue current regimen      Relevant Medications   amLODipine (NORVASC) 5 MG tablet   atorvastatin (LIPITOR) 20 MG tablet   lisinopril-hydrochlorothiazide (ZESTORETIC) 20-25 MG tablet   Other Relevant Orders   Lipid Panel w/o Chol/HDL Ratio (Completed)   Hx of transient ischemic attack (TIA)    Stable without recent issues or sxs. Continue current regimen       Other Visit Diagnoses    Annual physical exam       Laceration of right hand, foreign body presence unspecified, initial encounter       Relevant Orders   Td vaccine greater than or equal to 7yo preservative free IM (Completed)   Screening for prostate cancer       Relevant Orders   PSA (Completed)       Discussed aspirin prophylaxis for myocardial infarction prevention and decision was made to continue ASA  LABORATORY TESTING:  Health maintenance labs ordered today as discussed above.   The natural history of prostate cancer and ongoing controversy regarding screening and potential treatment outcomes of prostate cancer has been discussed with the patient. The meaning  of a false positive PSA and a false negative PSA has been discussed. He indicates understanding of the limitations of this screening test and wishes to proceed with screening PSA testing.   IMMUNIZATIONS:   -  Tdap: Tetanus vaccination status reviewed: Td vaccination indicated and given today. - Influenza: Up to date - Pneumovax: Up to date - Prevnar: Up to date - HPV: Not applicable - Zostavax vaccine: Refused  SCREENING: - Colonoscopy: Up to date  Discussed with patient purpose of the colonoscopy is to detect colon cancer at curable precancerous or early stages   PATIENT COUNSELING:    Sexuality: Discussed sexually transmitted diseases, partner selection, use of condoms, avoidance of unintended pregnancy  and contraceptive alternatives.   Advised to avoid cigarette smoking.  I discussed with the patient that most people either abstain from alcohol or drink within safe limits (<=14/week and <=4 drinks/occasion for males, <=7/weeks and <= 3 drinks/occasion for females) and that the risk for alcohol disorders and other health effects rises proportionally with the number of drinks per week and how often a drinker exceeds daily limits.  Discussed cessation/primary prevention of drug use and availability of treatment for abuse.   Diet: Encouraged to adjust caloric intake to maintain  or achieve ideal body weight, to reduce intake of dietary saturated fat and total fat, to limit sodium intake by avoiding high sodium foods and not adding table salt, and to maintain adequate dietary potassium and calcium preferably from fresh fruits, vegetables, and low-fat dairy products.    stressed the importance of regular exercise  Injury prevention: Discussed safety belts, safety helmets, smoke detector, smoking near bedding or upholstery.   Dental health: Discussed importance of regular tooth brushing, flossing, and dental visits.   Follow up plan: NEXT PREVENTATIVE PHYSICAL DUE IN 1 YEAR. Return in  about 6 months (around 04/11/2020) for 6 month f/u.

## 2019-10-11 LAB — COMPREHENSIVE METABOLIC PANEL
ALT: 11 IU/L (ref 0–44)
AST: 16 IU/L (ref 0–40)
Albumin/Globulin Ratio: 1.4 (ref 1.2–2.2)
Albumin: 4.3 g/dL (ref 3.8–4.8)
Alkaline Phosphatase: 69 IU/L (ref 48–121)
BUN/Creatinine Ratio: 14 (ref 10–24)
BUN: 22 mg/dL (ref 8–27)
Bilirubin Total: 0.2 mg/dL (ref 0.0–1.2)
CO2: 24 mmol/L (ref 20–29)
Calcium: 9.2 mg/dL (ref 8.6–10.2)
Chloride: 99 mmol/L (ref 96–106)
Creatinine, Ser: 1.52 mg/dL — ABNORMAL HIGH (ref 0.76–1.27)
GFR calc Af Amer: 54 mL/min/{1.73_m2} — ABNORMAL LOW (ref >59)
GFR calc non Af Amer: 46 mL/min/{1.73_m2} — ABNORMAL LOW (ref >59)
Globulin, Total: 3.1 g/dL (ref 1.5–4.5)
Glucose: 85 mg/dL (ref 65–99)
Potassium: 4.8 mmol/L (ref 3.5–5.2)
Sodium: 135 mmol/L (ref 134–144)
Total Protein: 7.4 g/dL (ref 6.0–8.5)

## 2019-10-11 LAB — LIPID PANEL W/O CHOL/HDL RATIO
Cholesterol, Total: 166 mg/dL (ref 100–199)
HDL: 49 mg/dL (ref >39)
LDL Chol Calc (NIH): 103 mg/dL — ABNORMAL HIGH (ref 0–99)
Triglycerides: 73 mg/dL (ref 0–149)
VLDL Cholesterol Cal: 14 mg/dL (ref 5–40)

## 2019-10-11 LAB — CBC WITH DIFFERENTIAL/PLATELET
Basophils Absolute: 0.1 10*3/uL (ref 0.0–0.2)
Basos: 1 %
EOS (ABSOLUTE): 0.2 10*3/uL (ref 0.0–0.4)
Eos: 2 %
Hematocrit: 37.1 % — ABNORMAL LOW (ref 37.5–51.0)
Hemoglobin: 11.5 g/dL — ABNORMAL LOW (ref 13.0–17.7)
Immature Grans (Abs): 0 10*3/uL (ref 0.0–0.1)
Immature Granulocytes: 0 %
Lymphocytes Absolute: 2.3 10*3/uL (ref 0.7–3.1)
Lymphs: 27 %
MCH: 23.8 pg — ABNORMAL LOW (ref 26.6–33.0)
MCHC: 31 g/dL — ABNORMAL LOW (ref 31.5–35.7)
MCV: 77 fL — ABNORMAL LOW (ref 79–97)
Monocytes Absolute: 0.5 10*3/uL (ref 0.1–0.9)
Monocytes: 6 %
Neutrophils Absolute: 5.4 10*3/uL (ref 1.4–7.0)
Neutrophils: 64 %
Platelets: 291 10*3/uL (ref 150–450)
RBC: 4.84 x10E6/uL (ref 4.14–5.80)
RDW: 15.6 % — ABNORMAL HIGH (ref 11.6–15.4)
WBC: 8.6 10*3/uL (ref 3.4–10.8)

## 2019-10-11 LAB — PSA: Prostate Specific Ag, Serum: 1.6 ng/mL (ref 0.0–4.0)

## 2019-10-12 NOTE — Assessment & Plan Note (Signed)
Recheck lipids, continue current regimen 

## 2019-10-12 NOTE — Assessment & Plan Note (Signed)
Mildly elevated, but has not taken medication. Continue home monitoring on current regimen and call with persistent elevated readings.

## 2019-10-12 NOTE — Assessment & Plan Note (Addendum)
Stable without recent issues or sxs. Continue current regimen

## 2019-10-12 NOTE — Assessment & Plan Note (Signed)
Recheck lipids, lifestyle modifications and current regimen to be continued

## 2019-10-12 NOTE — Assessment & Plan Note (Signed)
Stable and well controlled, continue current regimen 

## 2019-10-15 ENCOUNTER — Encounter: Payer: Medicare HMO | Admitting: Family Medicine

## 2019-10-19 ENCOUNTER — Telehealth: Payer: Self-pay

## 2019-10-19 NOTE — Telephone Encounter (Signed)
Contacted patient to set up annual lung screening CT scan.  Patient had his last scan 11/05/18.  Message left for patient to call Glenna Fellows, lung navigator to set up screening scan appt.

## 2019-10-24 ENCOUNTER — Ambulatory Visit (INDEPENDENT_AMBULATORY_CARE_PROVIDER_SITE_OTHER): Payer: Medicare HMO

## 2019-10-24 VITALS — Ht 72.0 in | Wt 148.0 lb

## 2019-10-24 DIAGNOSIS — Z0000 Encounter for general adult medical examination without abnormal findings: Secondary | ICD-10-CM | POA: Diagnosis not present

## 2019-10-24 NOTE — Patient Instructions (Signed)
[name] , Thank you for taking time to come for your Medicare Wellness Visit. I appreciate your ongoing commitment to your health goals. Please review the following plan we discussed and let me know if I can assist you in the future.   Screening recommendations/referrals: Colonoscopy: completed 06/02/2010, due 06/01/2020 Recommended yearly ophthalmology/optometry visit for glaucoma screening and checkup Recommended yearly dental visit for hygiene and checkup  Vaccinations: Influenza vaccine: completed 04/16/2019, due 11/02/2019 Pneumococcal vaccine: completed 10/03/2017 Tdap vaccine: completed 10/10/2019, due 10/09/2029 Shingles vaccine: discussed   Covid-19: thinking about getting  Advanced directives: Please bring a copy of your POA (Power of [place]) and/or Living Will to your next appointment.   Conditions/risks identified: smoking  Next appointment: Follow up in one year for your annual wellness visit.   Preventive Care 68 Years and Older, Male Preventive care refers to lifestyle choices and visits with your health care provider that can promote health and wellness. What does preventive care include?  A yearly physical exam. This is also called an annual well check.  Dental exams once or twice a year.  Routine eye exams. Ask your health care provider how often you should have your eyes checked.  Personal lifestyle choices, including:  Daily care of your teeth and gums.  Regular physical activity.  Eating a healthy diet.  Avoiding tobacco and drug use.  Limiting alcohol use.  Practicing safe sex.  Taking low doses of aspirin every day.  Taking vitamin and mineral supplements as recommended by your health care provider. What happens during an annual well check? The services and screenings done by your health care provider during your annual well check will depend on your age, overall health, lifestyle risk factors, and family history of disease. Counseling  Your health  care provider may ask you questions about your:  Alcohol use.  Tobacco use.  Drug use.  Emotional well-being.  Home and relationship well-being.  Sexual activity.  Eating habits.  History of falls.  Memory and ability to understand (cognition).  Work and work Astronomer. Screening  You may have the following tests or measurements:  Height, weight, and BMI.  Blood pressure.  Lipid and cholesterol levels. These may be checked every 5 years, or more frequently if you are over 21 years old.  Skin check.  Lung cancer screening. You may have this screening every year starting at age 68 if you have a 30-pack-year history of smoking and currently smoke or have quit within the past 15 years.  Fecal occult blood test (FOBT) of the stool. You may have this test every year starting at age 68.  Flexible sigmoidoscopy or colonoscopy. You may have a sigmoidoscopy every 5 years or a colonoscopy every 10 years starting at age 68.  Prostate cancer screening. Recommendations will vary depending on your family history and other risks.  Hepatitis C blood test.  Hepatitis B blood test.  Sexually transmitted disease (STD) testing.  Diabetes screening. This is done by checking your blood sugar (glucose) after you have not eaten for a while (fasting). You may have this done every 1-3 years.  Abdominal aortic aneurysm (AAA) screening. You may need this if you are a current or former smoker.  Osteoporosis. You may be screened starting at age 68 if you are at high risk. Talk with your health care provider about your test results, treatment options, and if necessary, the need for more tests. Vaccines  Your health care provider may recommend certain vaccines, such as:  Influenza vaccine.  This is recommended every year.  Tetanus, diphtheria, and acellular pertussis (Tdap, Td) vaccine. You may need a Td booster every 10 years.  Zoster vaccine. You may need this after age  68.  Pneumococcal 13-valent conjugate (PCV13) vaccine. One dose is recommended after age 68.  Pneumococcal polysaccharide (PPSV23) vaccine. One dose is recommended after age 68. Talk to your health care provider about which screenings and vaccines you need and how often you need them. This information is not intended to replace advice given to you by your health care provider. Make sure you discuss any questions you have with your health care provider. Document Released: 04/16/2015 Document Revised: 12/08/2015 Document Reviewed: 01/19/2015 Elsevier Interactive Patient Education  2017 [place] Prevention in the Home Falls can cause injuries. They can happen to people of all ages. There are many things you can do to make your home safe and to help prevent falls. What can I do on the outside of my home?  Regularly fix the edges of walkways and driveways and fix any cracks.  Remove anything that might make you trip as you walk through a door, such as a raised step or threshold.  Trim any bushes or trees on the path to your home.  Use bright outdoor lighting.  Clear any walking paths of anything that might make someone trip, such as rocks or tools.  Regularly check to see if handrails are loose or broken. Make sure that both sides of any steps have handrails.  Any raised decks and porches should have guardrails on the edges.  Have any leaves, snow, or ice cleared regularly.  Use sand or salt on walking paths during winter.  Clean up any spills in your garage right away. This includes oil or grease spills. What can I do in the bathroom?  Use night lights.  Install grab bars by the toilet and in the tub and shower. Do not use towel bars as grab bars.  Use non-skid mats or decals in the tub or shower.  If you need to sit down in the shower, use a plastic, non-slip stool.  Keep the floor dry. Clean up any water that spills on the floor as soon as it happens.  Remove  soap buildup in the tub or shower regularly.  Attach bath mats securely with double-sided non-slip rug tape.  Do not have throw rugs and other things on the floor that can make you trip. What can I do in the bedroom?  Use night lights.  Make sure that you have a light by your bed that is easy to reach.  Do not use any sheets or blankets that are too big for your bed. They should not hang down onto the floor.  Have a firm chair that has side arms. You can use this for support while you get dressed.  Do not have throw rugs and other things on the floor that can make you trip. What can I do in the kitchen?  Clean up any spills right away.  Avoid walking on wet floors.  Keep items that you use a lot in easy-to-reach places.  If you need to reach something above you, use a strong step stool that has a grab bar.  Keep electrical cords out of the way.  Do not use floor polish or wax that makes floors slippery. If you must use wax, use non-skid floor wax.  Do not have throw rugs and other things on the floor that can make  you trip. What can I do with my stairs?  Do not leave any items on the stairs.  Make sure that there are handrails on both sides of the stairs and use them. Fix handrails that are broken or loose. Make sure that handrails are as long as the stairways.  Check any carpeting to make sure that it is firmly attached to the stairs. Fix any carpet that is loose or worn.  Avoid having throw rugs at the top or bottom of the stairs. If you do have throw rugs, attach them to the floor with carpet tape.  Make sure that you have a light switch at the top of the stairs and the bottom of the stairs. If you do not have them, ask someone to add them for you. What else can I do to help prevent falls?  Wear shoes that:  Do not have high heels.  Have rubber bottoms.  Are comfortable and fit you well.  Are closed at the toe. Do not wear sandals.  If you use a  stepladder:  Make sure that it is fully opened. Do not climb a closed stepladder.  Make sure that both sides of the stepladder are locked into place.  Ask someone to hold it for you, if possible.  Clearly mark and make sure that you can see:  Any grab bars or handrails.  First and last steps.  Where the edge of each step is.  Use tools that help you move around (mobility aids) if they are needed. These include:  Canes.  Walkers.  Scooters.  Crutches.  Turn on the lights when you go into a dark area. Replace any light bulbs as soon as they burn out.  Set up your furniture so you have a clear path. Avoid moving your furniture around.  If any of your floors are uneven, fix them.  If there are any pets around you, be aware of where they are.  Review your medicines with your doctor. Some medicines can make you feel dizzy. This can increase your chance of falling. Ask your doctor what other things that you can do to help prevent falls. This information is not intended to replace advice given to you by your health care provider. Make sure you discuss any questions you have with your health care provider. Document Released: 01/14/2009 Document Revised: 08/26/2015 Document Reviewed: 04/24/2014 Elsevier Interactive Patient Education  2017 [employer].

## 2019-10-24 NOTE — Progress Notes (Signed)
I connected with Aaron Boyd today by telephone and verified that I am speaking with the correct person using two identifiers. Location patient: home Location provider: work Persons participating in the virtual visit: Aaron Boyd, Elisha PonderNickeah Makesha Belitz LPN.   I discussed the limitations, risks, security and privacy concerns of performing an evaluation and management service by telephone and the availability of in person appointments. I also discussed with the patient that there may be a patient responsible charge related to this service. The patient expressed understanding and verbally consented to this telephonic visit.    Interactive audio and video telecommunications were attempted between this provider and patient, however failed, due to patient having technical difficulties OR patient did not have access to video capability.  We continued and completed visit with audio only.    Vital signs may be patient reported or missing   Subjective:   Aaron Boyd is a 69 y.o. male who presents for Medicare Annual/Subsequent preventive examination.  Review of Systems     Cardiac Risk Factors include: advanced age (>355men, 38>65 women);dyslipidemia;hypertension;male gender;smoking/ tobacco exposure     Objective:    Today's Vitals   10/24/19 1345  Weight: 148 lb (67.1 kg)  Height: 6' (1.829 m)   Body mass index is 20.07 kg/m.  Advanced Directives 10/24/2019 10/07/2018 10/03/2017 09/05/2016 08/08/2016  Does Patient Have a Medical Advance Directive? Yes Yes Yes Yes Yes  Type of Estate agentAdvance Directive Healthcare Power of [place];Living will Living will;Healthcare Power of Attorney Living will;Healthcare Power of [employer] Corporationttorney Healthcare Power of [place];Living will -  Does patient want to make changes to medical advance directive? - - - - No - Patient declined  Copy of Healthcare Power of Attorney in Chart? No - copy requested No - copy requested No - copy requested - -    Current Medications  (verified) Outpatient Encounter Medications as of 10/24/2019  Medication Sig  . acetaminophen (TYLENOL) 325 MG tablet Take 650 mg by mouth every 6 (six) hours as needed.  Marland Kitchen. amLODipine (NORVASC) 5 MG tablet Take 1 tablet (5 mg total) by mouth daily.  Marland Kitchen. aspirin EC 81 MG tablet Take 81 mg by mouth daily. Swallow whole.  Marland Kitchen. atorvastatin (LIPITOR) 20 MG tablet TAKE 1 TABLET (20 MG TOTAL) DAILY.  Marland Kitchen. lisinopril-hydrochlorothiazide (ZESTORETIC) 20-25 MG tablet Take 1 tablet by mouth daily.  Marland Kitchen. albuterol (VENTOLIN HFA) 108 (90 Base) MCG/ACT inhaler Inhale 2 puffs into the lungs every 6 (six) hours as needed for wheezing or shortness of breath. (Patient not taking: Reported on 10/24/2019)  . aspirin 325 MG tablet Take 325 mg by mouth daily. (Patient not taking: Reported on 10/24/2019)   No facility-administered encounter medications on file as of 10/24/2019.    Allergies (verified) Patient has no known allergies.   History: Past Medical History:  Diagnosis Date  . CVA (cerebral infarction) 2005  . ED (erectile dysfunction)   . Hematuria   . Hyperlipidemia   . Hypertension    Past Surgical History:  Procedure Laterality Date  . clavicle surgery  1970  . EYE SURGERY Left   . left ear trauma    . varicose veins     Family History  Problem Relation Age of Onset  . Diabetes Mother   . Heart disease Father   . Hypertension Father   . Heart disease Brother    Social History   Socioeconomic History  . Marital status: Widowed    Spouse name: Not on file  . Number of children: Not on  file  . Years of education: Not on file  . Highest education level: High school graduate  Occupational History  . Occupation: retired  Tobacco Use  . Smoking status: Current Every Day Smoker    Packs/day: 0.50    Years: 44.00    Pack years: 22.00    Types: Cigarettes    Last attempt to quit: 06/17/2016    Years since quitting: 3.3  . Smokeless tobacco: Never Used  . Tobacco comment: quit 06/17/2016,  restarted smoking 03/2018  Vaping Use  . Vaping Use: Former  . Quit date: 05/04/2017  Substance and Sexual Activity  . Alcohol use: Not Currently    Alcohol/week: 0.0 standard drinks    Comment: on occasion  . Drug use: No  . Sexual activity: Not Currently  Other Topics Concern  . Not on file  Social History Narrative  . Not on file   Social Determinants of Health   Financial Resource Strain: Low Risk   . Difficulty of Paying Living Expenses: Not hard at all  Food Insecurity: No Food Insecurity  . Worried About Programme researcher, broadcasting/film/video in the Last Year: Never true  . Ran Out of Food in the Last Year: Never true  Transportation Needs: No Transportation Needs  . Lack of Transportation (Medical): No  . Lack of Transportation (Non-Medical): No  Physical Activity: Insufficiently Active  . Days of Exercise per Week: 7 days  . Minutes of Exercise per Session: 20 min  Stress: No Stress Concern Present  . Feeling of Stress : Not at all  Social Connections:   . Frequency of Communication with Friends and Family:   . Frequency of Social Gatherings with Friends and Family:   . Attends Religious Services:   . Active Member of Clubs or Organizations:   . Attends Banker Meetings:   Marland Kitchen Marital Status:     Tobacco Counseling Ready to quit: Yes Counseling given: Not Answered Comment: quit 06/17/2016, restarted smoking 03/2018   Clinical Intake:  Pre-visit preparation completed: Yes  Pain : No/denies pain     Nutritional Status: BMI of 19-24  Normal Nutritional Risks: None Diabetes: No  How often do you need to have someone help you when you read instructions, pamphlets, or other written materials from your doctor or pharmacy?: 1 - Never What is the last grade level you completed in school?: 12th grade  Diabetic? no  Interpreter Needed?: No  Information entered by :: NAllen LPN   Activities of Daily Living In your present state of health, do you have any  difficulty performing the following activities: 10/24/2019 10/10/2019  Hearing? N N  Vision? N N  Difficulty concentrating or making decisions? N N  Walking or climbing stairs? N N  Dressing or bathing? N N  Doing errands, shopping? N N  Preparing Food and eating ? N -  Using the Toilet? N -  In the past six months, have you accidently leaked urine? N -  Do you have problems with loss of bowel control? N -  Managing your Medications? N -  Managing your Finances? N -  Housekeeping or managing your Housekeeping? N -  Some recent data might be hidden    Patient Care Team: Particia Nearing, PA-C as PCP - General (Family Medicine)  Indicate any recent Medical Services you may have received from other than [facility] providers in the past year (date may be approximate).     Assessment:   This is a routine wellness  examination for Aaron Boyd.  Hearing/Vision screen  Hearing Screening   125Hz  250Hz  500Hz  1000Hz  2000Hz  3000Hz  4000Hz  6000Hz  8000Hz   Right ear:           Left ear:           Vision Screening Comments: No regular eye exams, [facility]  Dietary issues and exercise activities discussed: Current Exercise Habits: Home exercise routine, Type of exercise: walking, Time (Minutes): 20, Frequency (Times/Week): 7, Weekly Exercise (Minutes/Week): 140  Goals    . Patient Stated     10/24/2019, quit smoking     . Quit Smoking     tobacco use cessation discussed      Depression Screen PHQ 2/9 Scores 10/24/2019 10/10/2019 10/10/2018 10/07/2018 10/03/2017 08/08/2016  PHQ - 2 Score 0 1 0 0 0 0  PHQ- 9 Score - 3 2 - - 0    Fall Risk Fall Risk  10/24/2019 10/10/2019 10/10/2018 10/07/2018 10/03/2017  Falls in the past year? 0 0 0 0 No  Number falls in past yr: - 0 0 - -  Injury with Fall? - 0 0 - -  Risk for fall due to : Medication side effect - - - -  Follow up Falls evaluation completed;Education provided;Falls prevention discussed - - - -    Any stairs in or around the home? Yes  If so,  are there any without handrails? Yes  Home free of loose throw rugs in walkways, pet beds, electrical cords, etc? Yes  Adequate lighting in your home to reduce risk of falls? Yes   ASSISTIVE DEVICES UTILIZED TO PREVENT FALLS:  Life alert? No  Use of a cane, walker or w/c? No  Grab bars in the bathroom? No  Shower chair or bench in shower? No  Elevated toilet seat or a handicapped toilet? No   TIMED UP AND GO:  Was the test performed? No    Cognitive Function:     6CIT Screen 10/24/2019 10/03/2017  What Year? 0 points 0 points  What month? 0 points 0 points  What time? 0 points 0 points  Count back from 20 0 points 0 points  Months in reverse 0 points 0 points  Repeat phrase 0 points 2 points  Total Score 0 2    Immunizations Immunization History  Administered Date(s) Administered  . Fluad Quad(high Dose 65+) 04/16/2019  . Influenza, High Dose Seasonal PF 12/06/2016, 04/08/2018  . Influenza,inj,Quad PF,6+ Mos 01/19/2015, 01/07/2016  . Influenza-Unspecified 02/09/2014  . Pneumococcal Conjugate-13 08/08/2016  . Pneumococcal Polysaccharide-23 10/03/2017  . Td 10/10/2019    TDAP status: Up to date Flu Vaccine status: Up to date Pneumococcal vaccine status: Up to date Covid-19 vaccine status: Information provided on how to obtain vaccines.   Qualifies for Shingles Vaccine? Yes   Zostavax completed No   Shingrix Completed?: No.    Education has been provided regarding the importance of this vaccine. Patient has been advised to call insurance company to determine out of pocket expense if they have not yet received this vaccine. Advised may also receive vaccine at local pharmacy or Health Dept. Verbalized acceptance and understanding.  Screening Tests Health Maintenance  Topic Date Due  . COVID-19 Vaccine (1) Never done  . INFLUENZA VACCINE  11/02/2019  . COLONOSCOPY  06/01/2020  . TETANUS/TDAP  10/09/2029  . Hepatitis C Screening  Completed  . PNA vac Low Risk Adult   Completed    Health Maintenance  Health Maintenance Due  Topic Date Due  .  COVID-19 Vaccine (1) Never done    Colorectal cancer screening: Completed 06/02/2010 . Repeat every 10 years  Lung Cancer Screening: (Low Dose CT Chest recommended if Age 32-80 years, 30 pack-year currently smoking OR have quit w/in 15years.) does not qualify.   Lung Cancer Screening Referral: no  Additional Screening:  Hepatitis C Screening: does qualify; Completed 10/03/2017  Vision Screening: Recommended annual ophthalmology exams for early detection of glaucoma and other disorders of the eye. Is the patient up to date with their annual eye exam?  No  Who is the provider or what is the name of the office in which the patient attends annual eye exams? [facility] If pt is not established with a provider, would they like to be referred to a provider to establish care? No .   Dental Screening: Recommended annual dental exams for proper oral hygiene  Community Resource Referral / Chronic Care Management: CRR required this visit?  No   CCM required this visit?  No      Plan:     I have personally reviewed and noted the following in the patient's chart:   . Medical and social history . Use of alcohol, tobacco or illicit drugs  . Current medications and supplements . Functional ability and status . Nutritional status . Physical activity . Advanced directives . List of other physicians . Hospitalizations, surgeries, and ER visits in previous 12 months . Vitals . Screenings to include cognitive, depression, and falls . Referrals and appointments  In addition, I have reviewed and discussed with patient certain preventive protocols, quality metrics, and best practice recommendations. A written personalized care plan for preventive services as well as general preventive health recommendations were provided to patient.     Barb Merino, LPN   3/71/0626   Nurse Notes:

## 2019-11-04 ENCOUNTER — Telehealth: Payer: Self-pay | Admitting: *Deleted

## 2019-11-04 NOTE — Telephone Encounter (Signed)
Pt has been notified that lung cancer screening CT scan is due currently or will be in near future. Confirmed pt is within appropriate age range, and asymptomatic. Pt denies illness that would prevent curative treatment for lung cancer if found. Verified smoking history (Current Smoker, 1.5 ppd ). Pt will receive 2nd COVID VX on 11-27-19 [CT to be scheduled approx. 4 weeks after vx date] Pt is agreeable for CT scan being scheduled.

## 2019-11-06 ENCOUNTER — Other Ambulatory Visit: Payer: Self-pay | Admitting: *Deleted

## 2019-11-06 DIAGNOSIS — Z122 Encounter for screening for malignant neoplasm of respiratory organs: Secondary | ICD-10-CM

## 2019-11-06 DIAGNOSIS — Z87891 Personal history of nicotine dependence: Secondary | ICD-10-CM

## 2019-11-06 NOTE — Progress Notes (Signed)
[id number]

## 2019-11-14 ENCOUNTER — Encounter: Payer: Self-pay | Admitting: Family Medicine

## 2019-12-05 ENCOUNTER — Other Ambulatory Visit (INDEPENDENT_AMBULATORY_CARE_PROVIDER_SITE_OTHER): Payer: Self-pay | Admitting: Nurse Practitioner

## 2019-12-05 DIAGNOSIS — I6523 Occlusion and stenosis of bilateral carotid arteries: Secondary | ICD-10-CM

## 2019-12-09 ENCOUNTER — Ambulatory Visit (INDEPENDENT_AMBULATORY_CARE_PROVIDER_SITE_OTHER): Payer: Medicare HMO

## 2019-12-09 ENCOUNTER — Ambulatory Visit (INDEPENDENT_AMBULATORY_CARE_PROVIDER_SITE_OTHER): Payer: Medicare PPO | Admitting: Vascular Surgery

## 2019-12-09 ENCOUNTER — Encounter (INDEPENDENT_AMBULATORY_CARE_PROVIDER_SITE_OTHER): Payer: Self-pay | Admitting: Vascular Surgery

## 2019-12-09 ENCOUNTER — Other Ambulatory Visit: Payer: Self-pay

## 2019-12-09 VITALS — BP 129/76 | HR 98 | Resp 18 | Ht 72.0 in | Wt 146.0 lb

## 2019-12-09 DIAGNOSIS — I10 Essential (primary) hypertension: Secondary | ICD-10-CM | POA: Diagnosis not present

## 2019-12-09 DIAGNOSIS — E782 Mixed hyperlipidemia: Secondary | ICD-10-CM

## 2019-12-09 DIAGNOSIS — I6523 Occlusion and stenosis of bilateral carotid arteries: Secondary | ICD-10-CM

## 2019-12-09 NOTE — Assessment & Plan Note (Signed)
Carotid duplex shows stable 40 to 59% right ICA stenosis on the lower end of that range and a known left carotid occlusion.  No role for intervention at that level.  Continue current medical regimen.  Recheck in 1 year.

## 2019-12-09 NOTE — Progress Notes (Signed)
MRN : [id number]  Aaron Boyd is a 69 y.o. (17-Apr-1950) male who presents with chief complaint of  Chief Complaint  Patient presents with   Follow-up    ultrasound yearly  .  History of Present Illness: Patient returns in follow-up of his carotid disease.  He is doing well without complaints today.  Carotid duplex today shows stable 40 to 59% right ICA stenosis and a known left carotid occlusion.  Current Outpatient Medications  Medication Sig Dispense Refill   acetaminophen (TYLENOL) 325 MG tablet Take 650 mg by mouth every 6 (six) hours as needed.     amLODipine (NORVASC) 5 MG tablet Take 1 tablet (5 mg total) by mouth daily. 90 tablet 1   aspirin EC 81 MG tablet Take 81 mg by mouth daily. Swallow whole.     atorvastatin (LIPITOR) 20 MG tablet TAKE 1 TABLET (20 MG TOTAL) DAILY. 90 tablet 1   lisinopril-hydrochlorothiazide (ZESTORETIC) 20-25 MG tablet Take 1 tablet by mouth daily. 90 tablet 1   albuterol (VENTOLIN HFA) 108 (90 Base) MCG/ACT inhaler Inhale 2 puffs into the lungs every 6 (six) hours as needed for wheezing or shortness of breath. (Patient not taking: Reported on 10/24/2019) 18 g 0   aspirin 325 MG tablet Take 325 mg by mouth daily. (Patient not taking: Reported on 10/24/2019)     No current facility-administered medications for this visit.    Past Medical History:  Diagnosis Date   CVA (cerebral infarction) 2005   ED (erectile dysfunction)    Hematuria    Hyperlipidemia    Hypertension     Past Surgical History:  Procedure Laterality Date   clavicle surgery  1970   EYE SURGERY Left    left ear trauma     varicose veins       Social History   Tobacco Use   Smoking status: Current Every Day Smoker    Packs/day: 0.50    Years: 44.00    Pack years: 22.00    Types: Cigarettes    Last attempt to quit: 06/17/2016    Years since quitting: 3.4   Smokeless tobacco: Never Used   Tobacco comment: quit 06/17/2016, restarted smoking  03/2018  Vaping Use   Vaping Use: Former   Quit date: 05/04/2017  Substance Use Topics   Alcohol use: Not Currently    Alcohol/week: 0.0 standard drinks    Comment: on occasion   Drug use: No     Family History  Problem Relation Age of Onset   Diabetes Mother    Heart disease Father    Hypertension Father    Heart disease Brother     No Known Allergies   REVIEW OF SYSTEMS (Negative unless checked)  Constitutional: [] Weight loss  [] Fever  [] Chills Cardiac: [] Chest pain   [] Chest pressure   [] Palpitations   [] Shortness of breath when laying flat   [] Shortness of breath at rest   [] Shortness of breath with exertion. Vascular:  [] Pain in legs with walking   [] Pain in legs at rest   [] Pain in legs when laying flat   [] Claudication   [] Pain in feet when walking  [] Pain in feet at rest  [] Pain in feet when laying flat   [] History of DVT   [] Phlebitis   [] Swelling in legs   [] Varicose veins   [] Non-healing ulcers Pulmonary:   [] Uses home oxygen   [] Productive cough   [] Hemoptysis   [] Wheeze  [] COPD   [] Asthma Neurologic:  [x] Dizziness  [] Blackouts   []   Seizures   [x] History of stroke   [x] History of TIA  [] Aphasia   [] Temporary blindness   [] Dysphagia   [] Weakness or numbness in arms   [] Weakness or numbness in legs Musculoskeletal:  [] Arthritis   [] Joint swelling   [] Joint pain   [] Low back pain Hematologic:  [] Easy bruising  [] Easy bleeding   [] Hypercoagulable state   [] Anemic  [] Hepatitis Gastrointestinal:  [] Blood in stool   [] Vomiting blood  [] Gastroesophageal reflux/heartburn   [] Difficulty swallowing. Genitourinary:  [] Chronic kidney disease   [] Difficult urination  [] Frequent urination  [] Burning with urination   [x] Blood in urine Skin:  [] Rashes   [] Ulcers   [] Wounds Psychological:  [] History of anxiety   []  History of major depression.  Physical Examination  Vitals:   12/09/19 1319 12/09/19 1320  BP: 121/71 129/76  Pulse: 98   Resp: 18   Weight: 146 lb (66.2 kg)     Height: 6' (1.829 m)    Body mass index is 19.8 kg/m. Gen:  WD/WN, NAD Head: Winter/AT, No temporalis wasting. Ear/Nose/Throat: Hearing grossly intact, nares w/o erythema or drainage, trachea midline Eyes: Conjunctiva clear. Sclera non-icteric Neck: Supple.  Left carotid bruit  Pulmonary:  Good air movement, equal and clear to auscultation bilaterally.  Cardiac: RRR, No JVD Vascular:  Vessel Right Left  Radial Palpable Palpable           Musculoskeletal: M/S 5/5 throughout.  No deformity or atrophy.  No edema. Neurologic: CN 2-12 intact. Sensation grossly intact in extremities.  Symmetrical.  Speech is fluent. Motor exam as listed above. Psychiatric: Judgment intact, Mood & affect appropriate for pt's clinical situation. Dermatologic: No rashes or ulcers noted.  No cellulitis or open wounds.      CBC Lab Results  Component Value Date   WBC 8.6 10/10/2019   HGB 11.5 (L) 10/10/2019   HCT 37.1 (L) 10/10/2019   MCV 77 (L) 10/10/2019   PLT 291 10/10/2019    BMET    Component Value Date/Time   NA 135 10/10/2019 1430   K 4.8 10/10/2019 1430   CL 99 10/10/2019 1430   CO2 24 10/10/2019 1430   GLUCOSE 85 10/10/2019 1430   BUN 22 10/10/2019 1430   CREATININE 1.52 (H) 10/10/2019 1430   CALCIUM 9.2 10/10/2019 1430   GFRNONAA 46 (L) 10/10/2019 1430   GFRAA 54 (L) 10/10/2019 1430   CrCl cannot be calculated (Patient's most recent lab result is older than the maximum 21 days allowed.).  COAG No results found for: INR, PROTIME  Radiology No results found.   Assessment/Plan Essential (primary) hypertension blood pressure control important in reducing the progression of atherosclerotic disease. On appropriate oral medications.   Hyperlipidemia, unspecified lipid control important in reducing the progression of atherosclerotic disease. Continue statin therapy  Bilateral carotid artery disease (HCC) Carotid duplex shows stable 40 to 59% right ICA stenosis on the lower  end of that range and a known left carotid occlusion.  No role for intervention at that level.  Continue current medical regimen.  Recheck in 1 year.    , MD  12/09/2019 1:56 PM    This note was created with Dragon medical transcription system.  Any errors from dictation are purely unintentional

## 2020-01-01 ENCOUNTER — Ambulatory Visit: Admission: RE | Admit: 2020-01-01 | Payer: Medicare HMO | Source: Ambulatory Visit

## 2020-01-07 ENCOUNTER — Telehealth: Payer: Self-pay | Admitting: *Deleted

## 2020-01-07 NOTE — Telephone Encounter (Signed)
Attempted to contact to reschedule no show lung screening appt. However there is no answer or voicemail option available.

## 2020-01-22 ENCOUNTER — Telehealth: Payer: Self-pay

## 2020-01-22 NOTE — Telephone Encounter (Signed)
Pt would like a refill of Amlodipine  Lisinopril and atorvastatin  sent to [facility] has apt on 01/29/2020.Please advise.

## 2020-01-23 NOTE — Telephone Encounter (Signed)
Called pharmacy because patient should not be out of medication. Sent in 10/10/19 for 90 with 1 refill. Pharmacy states that all 3 medications were shipped to the patient on 01/15/20.   Tried calling patient, had to LVM. Let patient know that medications have been shipped and that they should arrive today or Monday if they have not already. Asked for patient to callback with any questions or concerns.

## 2020-01-23 NOTE — Telephone Encounter (Signed)
Pt returned called/ advised of message/ Pt stated he received medication and that these were his last refills/ Pt stated he told someone at the office yesterday

## 2020-01-29 ENCOUNTER — Encounter: Payer: Self-pay | Admitting: Nurse Practitioner

## 2020-01-29 ENCOUNTER — Other Ambulatory Visit: Payer: Self-pay

## 2020-01-29 ENCOUNTER — Ambulatory Visit (INDEPENDENT_AMBULATORY_CARE_PROVIDER_SITE_OTHER): Payer: Medicare HMO | Admitting: Nurse Practitioner

## 2020-01-29 VITALS — BP 139/77 | HR 94 | Temp 98.5°F | Wt 144.6 lb

## 2020-01-29 DIAGNOSIS — M542 Cervicalgia: Secondary | ICD-10-CM | POA: Insufficient documentation

## 2020-01-29 DIAGNOSIS — Z23 Encounter for immunization: Secondary | ICD-10-CM | POA: Diagnosis not present

## 2020-01-29 MED ORDER — TIZANIDINE HCL 2 MG PO TABS: 2.0000 mg | ORAL_TABLET | Freq: Four times a day (QID) | ORAL | 0 refills | Status: DC | PRN

## 2020-01-29 MED ORDER — DICLOFENAC SODIUM 1 % EX GEL: 2.0000 g | Freq: Four times a day (QID) | CUTANEOUS | 0 refills | Status: DC

## 2020-01-29 NOTE — Patient Instructions (Signed)

## 2020-01-29 NOTE — Assessment & Plan Note (Signed)
Acute, ongoing.  Likely strain from sleeping on mattress he does not regularly use.  Exercises given to start.  Also good to use ice, heat alternating and Tylenol.  Also start muscle relaxant.  Sample given for voltaren gel today; if he likes will send in a large tube to pharmacy.  Follow up in 2-4 weeks if not feeling better; may want to consider imaging and/or physical therapy.

## 2020-01-29 NOTE — Progress Notes (Signed)
BP 139/77   Pulse 94   Temp 98.5 F (36.9 C) (Oral)   Wt 144 lb 9.6 oz (65.6 kg)   SpO2 98%   BMI 19.61 kg/m    Subjective:    Patient ID: Cathren Laine, male    DOB: [birth year], 69 y.o.   MRN: [id number]  HPI: BEAUREGARD JARRELLS is a 69 y.o. male presenting with musculoskeletal complaints.   Chief Complaint  Patient presents with  . Back Pain    Started Sunday to Thursday of last week along with the Neck, Head, and leg pain, Back is not hurting at this time  . Neck Pain    since last Sunday  . Headache    since last Sunday  . Leg Pain    not hurting at this time.   NECK PAIN Started with back, leg, and neck pain last Sunday about 10 days ago.  It started after he woke up on a bed he is not used to sleeping on.  The back and leg pain went away by Friday but the neck pain remains.  Status: uncontrolled Treatments attempted: heat and APAP  Relief with NSAIDs?:  mild Location:Right Duration:weeks Severity: moderate Quality: pulling Frequency: only when moving head Radiation: headache Aggravating factors: moving head Alleviating factors: Tylenol,  Weakness:  no Paresthesias / decreased sensation:  no  Fevers:  no  No Known Allergies  Outpatient Encounter Medications as of 01/29/2020  Medication Sig Note  . acetaminophen (TYLENOL) 325 MG tablet Take 650 mg by mouth every 6 (six) hours as needed.   Marland Kitchen amLODipine (NORVASC) 5 MG tablet Take 1 tablet (5 mg total) by mouth daily.   Marland Kitchen aspirin EC 81 MG tablet Take 81 mg by mouth daily. Swallow whole.   Marland Kitchen atorvastatin (LIPITOR) 20 MG tablet TAKE 1 TABLET (20 MG TOTAL) DAILY.   Marland Kitchen lisinopril-hydrochlorothiazide (ZESTORETIC) 20-25 MG tablet Take 1 tablet by mouth daily.   . diclofenac Sodium (VOLTAREN) 1 % GEL Apply 2 g topically 4 (four) times daily.   Marland Kitchen tiZANidine (ZANAFLEX) 2 MG tablet Take 1 tablet (2 mg total) by mouth every 6 (six) hours as needed for muscle spasms. Do not sure while driving or operating heavy machinery  - may make you drowsy.   . [DISCONTINUED] albuterol (VENTOLIN HFA) 108 (90 Base) MCG/ACT inhaler Inhale 2 puffs into the lungs every 6 (six) hours as needed for wheezing or shortness of breath. (Patient not taking: Reported on 10/24/2019)   . [DISCONTINUED] aspirin 325 MG tablet Take 325 mg by mouth daily. (Patient not taking: Reported on 10/24/2019) 05/21/2015: Received from: [facility] Received Sig: Take by mouth.   No facility-administered encounter medications on file as of 01/29/2020.   Patient Active Problem List   Diagnosis Date Noted  . Neck pain 01/29/2020  . Advanced care planning/counseling discussion 08/08/2016  . Hematuria 02/14/2016  . Hx of transient ischemic attack (TIA) 11/05/2015  . Memory loss 11/05/2015  . Bilateral carotid artery disease (HCC) 03/09/2015  . Chronic obstructive pulmonary disease (HCC) 01/19/2015  . Chronic kidney disease, stage 3 (HCC) 01/19/2015  . Chronic kidney disease, stage II (mild) 01/19/2015  . Hypertensive renal disease 01/18/2015  . Hyperlipidemia 01/18/2015  . Contact dermatitis 01/18/2015  . Essential (primary) hypertension 01/18/2015   Past Medical History:  Diagnosis Date  . CVA (cerebral infarction) 2005  . ED (erectile dysfunction)   . Hematuria   . Hyperlipidemia   . Hypertension    Relevant past medical,  surgical, family and social history reviewed and updated as indicated. Interim medical history since our last visit reviewed.  Review of Systems  Constitutional: Negative.  Negative for activity change, appetite change, fatigue and fever.  Musculoskeletal: Positive for neck pain. Negative for gait problem, myalgias and neck stiffness.  Skin: Negative.   Neurological: Positive for headaches. Negative for dizziness, weakness and numbness.  Psychiatric/Behavioral: Negative.     Per HPI unless specifically indicated above     Objective:    BP 139/77   Pulse 94   Temp 98.5 F (36.9 C) (Oral)   Wt 144 lb  9.6 oz (65.6 kg)   SpO2 98%   BMI 19.61 kg/m   Wt Readings from Last 3 Encounters:  01/29/20 144 lb 9.6 oz (65.6 kg)  12/09/19 146 lb (66.2 kg)  10/24/19 148 lb (67.1 kg)    Physical Exam Vitals and nursing note reviewed.  Constitutional:      General: He is not in acute distress.    Appearance: He is well-developed. He is not toxic-appearing.  HENT:     Right Ear: External ear normal.     Left Ear: External ear normal.  Eyes:     General: No scleral icterus.    Extraocular Movements: Extraocular movements intact.  Musculoskeletal:     Cervical back: Normal range of motion. Tenderness (near right sternocletomastoid area) present. No rigidity.  Lymphadenopathy:     Cervical: No cervical adenopathy.  Skin:    General: Skin is warm and dry.     Coloration: Skin is not jaundiced or pale.     Findings: No erythema.  Neurological:     Mental Status: He is alert and oriented to person, place, and time.     Motor: No weakness.     Coordination: Coordination normal.     Gait: Gait normal.  Psychiatric:        Mood and Affect: Mood normal.        Behavior: Behavior normal.        Thought Content: Thought content normal.        Judgment: Judgment normal.       Assessment & Plan:   Problem List Items Addressed This Visit      Other   Neck pain - Primary    Acute, ongoing.  Likely strain from sleeping on mattress he does not regularly use.  Exercises given to start.  Also good to use ice, heat alternating and Tylenol.  Also start muscle relaxant.  Sample given for voltaren gel today; if he likes will send in a large tube to pharmacy.  Follow up in 2-4 weeks if not feeling better; may want to consider imaging and/or physical therapy.      Relevant Medications   tiZANidine (ZANAFLEX) 2 MG tablet    Other Visit Diagnoses    Need for influenza vaccination       Relevant Orders   Flu Vaccine QUAD High Dose(Fluad) (Completed)       Follow up plan: Return if symptoms worsen  or fail to improve.

## 2020-02-12 ENCOUNTER — Telehealth: Payer: Self-pay

## 2020-02-12 DIAGNOSIS — Z87891 Personal history of nicotine dependence: Secondary | ICD-10-CM

## 2020-02-12 DIAGNOSIS — Z122 Encounter for screening for malignant neoplasm of respiratory organs: Secondary | ICD-10-CM

## 2020-02-12 NOTE — Telephone Encounter (Addendum)
Patient contacted and scheduled for CT lung screening scan on 03/11/20 at 11 am at [facility] [address] Verified with patient smoking status. He is a former smoker and quit about 4 weeks ago. Prior to quitting he smoked about 1/2 pack per day currently and has been a smoker for approximately 44 years. No major changes in medical condition since last year. No changes in insurance since last year, pt still has [employer]. Pt would appreciate a reminder letter be mailed once CT is scheduled.

## 2020-02-13 ENCOUNTER — Encounter: Payer: Self-pay | Admitting: *Deleted

## 2020-02-13 NOTE — Addendum Note (Signed)
Addended by: Jonne Ply on: 02/13/2020 08:05 AM   Modules accepted: Orders

## 2020-02-13 NOTE — Telephone Encounter (Signed)
Former smoker, quit 01/02/20, 35.5 pack year history.

## 2020-03-11 ENCOUNTER — Ambulatory Visit: Admission: RE | Admit: 2020-03-11 | Payer: Medicare HMO | Source: Ambulatory Visit

## 2020-04-05 ENCOUNTER — Telehealth: Payer: Self-pay | Admitting: *Deleted

## 2020-04-05 NOTE — Telephone Encounter (Signed)
Attempted to contact and schedule lung screening scan. Message left for patient to call back to schedule. Note, patient has prior no show appt.

## 2020-04-10 ENCOUNTER — Encounter: Payer: Self-pay | Admitting: Nurse Practitioner

## 2020-04-10 DIAGNOSIS — I700 Atherosclerosis of aorta: Secondary | ICD-10-CM | POA: Insufficient documentation

## 2020-04-10 DIAGNOSIS — Z87891 Personal history of nicotine dependence: Secondary | ICD-10-CM | POA: Insufficient documentation

## 2020-04-12 ENCOUNTER — Ambulatory Visit: Payer: Medicare HMO | Admitting: Family Medicine

## 2020-04-12 ENCOUNTER — Ambulatory Visit (INDEPENDENT_AMBULATORY_CARE_PROVIDER_SITE_OTHER): Payer: Medicare HMO | Admitting: Nurse Practitioner

## 2020-04-12 ENCOUNTER — Encounter: Payer: Self-pay | Admitting: Nurse Practitioner

## 2020-04-12 VITALS — BP 114/71 | HR 91 | Temp 98.2°F | Ht 72.68 in | Wt 151.0 lb

## 2020-04-12 DIAGNOSIS — I700 Atherosclerosis of aorta: Secondary | ICD-10-CM

## 2020-04-12 DIAGNOSIS — J432 Centrilobular emphysema: Secondary | ICD-10-CM | POA: Diagnosis not present

## 2020-04-12 DIAGNOSIS — Z87891 Personal history of nicotine dependence: Secondary | ICD-10-CM | POA: Diagnosis not present

## 2020-04-12 DIAGNOSIS — I10 Essential (primary) hypertension: Secondary | ICD-10-CM

## 2020-04-12 DIAGNOSIS — N1831 Chronic kidney disease, stage 3a: Secondary | ICD-10-CM | POA: Diagnosis not present

## 2020-04-12 DIAGNOSIS — E782 Mixed hyperlipidemia: Secondary | ICD-10-CM

## 2020-04-12 DIAGNOSIS — I6523 Occlusion and stenosis of bilateral carotid arteries: Secondary | ICD-10-CM

## 2020-04-12 NOTE — Assessment & Plan Note (Signed)
Chronic, stable with BP at goal today. Recommend he monitor BP at least a few mornings a week at home and document.  DASH diet at home.  Continue current medication regimen and adjust as needed.  Labs next visit, patient wishes to defer labs today.  Return in 6 months.  Could consider in future switching to Losartan vs Lisinopril due to underlying emphysema.  Next visit check BMP, urine ALB, and TSH.

## 2020-04-12 NOTE — Assessment & Plan Note (Signed)
Chronic, ongoing.  Continue current medication regimen and adjust as needed, if LDL above goal on next visit labs consider increase Atorvastatin to 40 MG.  Lipid panel next visit, he wishes to defer labs today.

## 2020-04-12 NOTE — Assessment & Plan Note (Signed)
Quit in September 2021.  Recommend continued cessation of smoking to aid in prevention.

## 2020-04-12 NOTE — Assessment & Plan Note (Signed)
Ongoing, stable, noted on labs in 2020 to present.  He wishes to defer labs today.  Recheck next visit and educated him on kidney disease.  If progression consider referral to nephrology.

## 2020-04-12 NOTE — Patient Instructions (Signed)
Chronic Obstructive Pulmonary Disease  Chronic obstructive pulmonary disease (COPD) is a long-term (chronic) lung problem. When you have COPD, it is hard for air to get in and out of your lungs. Usually the condition gets worse over time, and your lungs will never return to normal. There are things you can do to keep yourself as healthy as possible. What are the causes?  Smoking. This is the most common cause.  Certain genes passed from parent to child (inherited). What increases the risk?  Being exposed to secondhand smoke from cigarettes, pipes, or cigars.  Being exposed to chemicals and other irritants, such as fumes and dust in the work environment.  Having chronic lung conditions or infections. What are the signs or symptoms?  Shortness of breath, especially during physical activity.  A long-term cough with a large amount of thick mucus. Sometimes, the cough may not have any mucus (dry cough).  Wheezing.  Breathing quickly.  Skin that looks gray or blue, especially in the fingers, toes, or lips.  Feeling tired (fatigue).  Weight loss.  Chest tightness.  Having infections often.  Episodes when breathing symptoms become much worse (exacerbations). At the later stages of this disease, you may have swelling in the ankles, feet, or legs. How is this treated?  Taking medicines.  Quitting smoking, if you smoke.  Rehabilitation. This includes steps to make your body work better. It may involve a team of specialists.  Doing exercises.  Making changes to your diet.  Using oxygen.  Lung surgery.  Lung transplant.  Comfort measures (palliative care). Follow these instructions at home: Medicines  Take over-the-counter and prescription medicines only as told by your doctor.  Talk to your doctor before taking any cough or allergy medicines. You may need to avoid medicines that cause your lungs to be dry. Lifestyle  If you smoke, stop smoking. Smoking makes the  problem worse.  Do not smoke or use any products that contain nicotine or tobacco. If you need help quitting, ask your doctor.  Avoid being around things that make your breathing worse. This may include smoke, chemicals, and fumes.  Stay active, but remember to rest as well.  Learn and use tips on how to manage stress and control your breathing.  Make sure you get enough sleep. Most adults need at least 7 hours of sleep every night.  Eat healthy foods. Eat smaller meals more often. Rest before meals. Controlled breathing Learn and use tips on how to control your breathing as told by your doctor. Try:  Breathing in (inhaling) through your nose for 1 second. Then, pucker your lips and breath out (exhale) through your lips for 2 seconds.  Putting one hand on your belly (abdomen). Breathe in slowly through your nose for 1 second. Your hand on your belly should move out. Pucker your lips and breathe out slowly through your lips. Your hand on your belly should move in as you breathe out.   Controlled coughing Learn and use controlled coughing to clear mucus from your lungs. Follow these steps: 1. Lean your head a little forward. 2. Breathe in deeply. 3. Try to hold your breath for 3 seconds. 4. Keep your mouth slightly open while coughing 2 times. 5. Spit any mucus out into a tissue. 6. Rest and do the steps again 1 or 2 times as needed. General instructions  Make sure you get all the shots (vaccines) that your doctor recommends. Ask your doctor about a flu shot and a pneumonia shot.    Use oxygen therapy and pulmonary rehabilitation if told by your doctor. If you need home oxygen therapy, ask your doctor if you should buy a tool to measure your oxygen level (oximeter).  Make a COPD action plan with your doctor. This helps you to know what to do if you feel worse than usual.  Manage any other conditions you have as told by your doctor.  Avoid going outside when it is very hot, cold, or  humid.  Avoid people who have a sickness you can catch (contagious).  Keep all follow-up visits. Contact a doctor if:  You cough up more mucus than usual.  There is a change in the color or thickness of the mucus.  It is harder to breathe than usual.  Your breathing is faster than usual.  You have trouble sleeping.  You need to use your medicines more often than usual.  You have trouble doing your normal activities such as getting dressed or walking around the house. Get help right away if:  You have shortness of breath while resting.  You have shortness of breath that stops you from: ? Being able to talk. ? Doing normal activities.  Your chest hurts for longer than 5 minutes.  Your skin color is more blue than usual.  Your pulse oximeter shows that you have low oxygen for longer than 5 minutes.  You have a fever.  You feel too tired to breathe normally. These symptoms may represent a serious problem that is an emergency. Do not wait to see if the symptoms will go away. Get medical help right away. Call your local emergency services (911 in the [place] Do not drive yourself to the hospital. Summary  Chronic obstructive pulmonary disease (COPD) is a long-term lung problem.  The way your lungs work will never return to normal. Usually the condition gets worse over time. There are things you can do to keep yourself as healthy as possible.  Take over-the-counter and prescription medicines only as told by your doctor.  If you smoke, stop. Smoking makes the problem worse. This information is not intended to replace advice given to you by your health care provider. Make sure you discuss any questions you have with your health care provider. Document Revised: 01/27/2020 Document Reviewed: 01/27/2020 Elsevier Patient Education  2021 Elsevier Inc.   

## 2020-04-12 NOTE — Assessment & Plan Note (Signed)
Followed by vascular and last seen 12/09/19.  Recommend continue statin and ASA daily.

## 2020-04-12 NOTE — Assessment & Plan Note (Signed)
Chronic, ongoing.  Noted on CT imaging, he reports not realizing diagnosis was present.  Educated him on this diagnosis.  No current inhalers or symptoms.  Recommend continued cessation of smoking and obtain PFTs next visit to further assess.

## 2020-04-12 NOTE — Assessment & Plan Note (Signed)
Noted on CT imaging 11/05/2018.  Educated patient on this finding.  Recommend continued cessation of smoking and to continue statin daily + ASA for prevention.

## 2020-04-12 NOTE — Progress Notes (Signed)
BP 114/71   Pulse 91   Temp 98.2 F (36.8 C) (Oral)   Ht 6' 0.68" (1.846 m)   Wt 151 lb (68.5 kg)   SpO2 97%   BMI 20.10 kg/m    Subjective:    Patient ID: Aaron Boyd, male    DOB: 1950/05/09, 70 y.o.   MRN: [id number]  HPI: Aaron LaineRobert D Aye is a 70 y.o. male  Chief Complaint  Patient presents with  . Cartoid artery disease  . COPD  . Hyperlipidemia  . Hypertension   HYPERTENSION / HYPERLIPIDEMIA Continues Amlodipine, Lisinopril-HCTZ, and Atorvastatin.   Satisfied with current treatment? yes Duration of hypertension: chronic BP monitoring frequency: not checking BP range:  BP medication side effects: no Duration of hyperlipidemia: chronic Cholesterol medication side effects: no Cholesterol supplements: none Medication compliance: good compliance Aspirin: yes Recent stressors: no Recurrent headaches: no Visual changes: no Palpitations: no Dyspnea: no Chest pain: no Lower extremity edema: no Dizzy/lightheaded: no   COPD On lung screening in August 2020 noted mild centrilobular emphysema and aortic atherosclerosis.  He quit smoking in September 2021 -- he has quit before, once for 3 years and once for 4 months.  He smoked for 50 years -- smoked 1/2 to 1 PPD. COPD status: stable Satisfied with current treatment?: yes Oxygen use: no Dyspnea frequency: none Cough frequency: none Rescue inhaler frequency: none used -- takes daily walks Limitation of activity: no Productive cough: none Last Spirometry: unknown Pneumovax: Up to Date Influenza: Up to Date   CHRONIC KIDNEY DISEASE Noted on past labs over the past 2 years, last labs in July 2021 CRT 1.52 and GFR 46.   CKD status: controlled Medications renally dose: yes Previous renal evaluation: no Pneumovax:  Up to Date Influenza Vaccine:  Up to Date  Relevant past medical, surgical, family and social history reviewed and updated as indicated. Interim medical history since our last visit  reviewed. Allergies and medications reviewed and updated.  Review of Systems  Constitutional: Negative for activity change, diaphoresis, fatigue and fever.  Respiratory: Negative for cough, chest tightness, shortness of breath and wheezing.   Cardiovascular: Negative for chest pain, palpitations and leg swelling.  Gastrointestinal: Negative.   Psychiatric/Behavioral: Negative.     Per HPI unless specifically indicated above     Objective:    BP 114/71   Pulse 91   Temp 98.2 F (36.8 C) (Oral)   Ht 6' 0.68" (1.846 m)   Wt 151 lb (68.5 kg)   SpO2 97%   BMI 20.10 kg/m   Wt Readings from Last 3 Encounters:  04/12/20 151 lb (68.5 kg)  01/29/20 144 lb 9.6 oz (65.6 kg)  12/09/19 146 lb (66.2 kg)    Physical Exam Vitals and nursing note reviewed.  Constitutional:      General: He is awake. He is not in acute distress.    Appearance: He is well-developed, well-groomed and underweight. He is not ill-appearing.  HENT:     Head: Normocephalic and atraumatic.     Right Ear: Hearing normal. No drainage.     Left Ear: Hearing normal. No drainage.  Eyes:     General: Lids are normal.        Right eye: No discharge.        Left eye: No discharge.     Conjunctiva/sclera: Conjunctivae normal.     Pupils: Pupils are equal, round, and reactive to light.  Neck:     Thyroid: No thyromegaly.  Vascular: No carotid bruit or JVD.     Trachea: Trachea normal.  Cardiovascular:     Rate and Rhythm: Normal rate and regular rhythm.     Heart sounds: Normal heart sounds, S1 normal and S2 normal. No murmur heard. No gallop.   Pulmonary:     Effort: Pulmonary effort is normal.     Breath sounds: Normal breath sounds.  Abdominal:     General: Bowel sounds are normal.     Palpations: Abdomen is soft. There is no hepatomegaly or splenomegaly.  Musculoskeletal:        General: Normal range of motion.     Cervical back: Normal range of motion and neck supple.     Right lower leg: No edema.      Left lower leg: No edema.  Skin:    General: Skin is warm and dry.     Capillary Refill: Capillary refill takes less than 2 seconds.  Neurological:     Mental Status: He is alert and oriented to person, place, and time.     Deep Tendon Reflexes: Reflexes are normal and symmetric.  Psychiatric:        Attention and Perception: Attention normal.        Mood and Affect: Mood normal.        Speech: Speech normal.        Behavior: Behavior normal. Behavior is cooperative.        Thought Content: Thought content normal.     Results for orders placed or performed in visit on 10/10/19  Microscopic Examination   Urine  Result Value Ref Range   WBC, UA None seen 0 - 5 /hpf   RBC 3-10 (A) 0 - 2 /hpf   Epithelial Cells (non renal) 0-10 0 - 10 /hpf   Bacteria, UA None seen None seen/Few  CBC with Differential/Platelet  Result Value Ref Range   WBC 8.6 3.4 - 10.8 x10E3/uL   RBC 4.84 4.14 - 5.80 x10E6/uL   Hemoglobin 11.5 (L) 13.0 - 17.7 g/dL   Hematocrit 16.6 (L) 06.3 - 51.0 %   MCV 77 (L) 79 - 97 fL   MCH 23.8 (L) 26.6 - 33.0 pg   MCHC 31.0 (L) 31.5 - 35.7 g/dL   RDW 01.6 (H) 01.0 - 93.2 %   Platelets 291 150 - 450 x10E3/uL   Neutrophils 64 Not Estab. %   Lymphs 27 Not Estab. %   Monocytes 6 Not Estab. %   Eos 2 Not Estab. %   Basos 1 Not Estab. %   Neutrophils Absolute 5.4 1.4 - 7.0 x10E3/uL   Lymphocytes Absolute 2.3 0.7 - 3.1 x10E3/uL   Monocytes Absolute 0.5 0.1 - 0.9 x10E3/uL   EOS (ABSOLUTE) 0.2 0.0 - 0.4 x10E3/uL   Basophils Absolute 0.1 0.0 - 0.2 x10E3/uL   Immature Granulocytes 0 Not Estab. %   Immature Grans (Abs) 0.0 0.0 - 0.1 x10E3/uL  Comprehensive metabolic panel  Result Value Ref Range   Glucose 85 65 - 99 mg/dL   BUN 22 8 - 27 mg/dL   Creatinine, Ser 3.55 (H) 0.76 - 1.27 mg/dL   GFR calc non Af Amer 46 (L) >59 mL/min/1.73   GFR calc Af Amer 54 (L) >59 mL/min/1.73   BUN/Creatinine Ratio 14 10 - 24   Sodium 135 134 - 144 mmol/L   Potassium 4.8 3.5 - 5.2  mmol/L   Chloride 99 96 - 106 mmol/L   CO2 24 20 - 29 mmol/L  Calcium 9.2 8.6 - 10.2 mg/dL   Total Protein 7.4 6.0 - 8.5 g/dL   Albumin 4.3 3.8 - 4.8 g/dL   Globulin, Total 3.1 1.5 - 4.5 g/dL   Albumin/Globulin Ratio 1.4 1.2 - 2.2   Bilirubin Total 0.2 0.0 - 1.2 mg/dL   Alkaline Phosphatase 69 48 - 121 IU/L   AST 16 0 - 40 IU/L   ALT 11 0 - 44 IU/L  Lipid Panel w/o Chol/HDL Ratio  Result Value Ref Range   Cholesterol, Total 166 100 - 199 mg/dL   Triglycerides 73 0 - 149 mg/dL   HDL 49 >69 mg/dL   VLDL Cholesterol Cal 14 5 - 40 mg/dL   LDL Chol Calc (NIH) 485 (H) 0 - 99 mg/dL  UA/M w/rflx Culture, Routine   Specimen: Urine, Clean Catch   Urine  Result Value Ref Range   Specific Gravity, UA 1.015 1.005 - 1.030   pH, UA 6.5 5.0 - 7.5   Color, UA Yellow Yellow   Appearance Ur Clear Clear   Leukocytes,UA Negative Negative   Protein,UA Negative Negative/Trace   Glucose, UA Negative Negative   Ketones, UA Negative Negative   RBC, UA 2+ (A) Negative   Bilirubin, UA Negative Negative   Urobilinogen, Ur 0.2 0.2 - 1.0 mg/dL   Nitrite, UA Negative Negative   Microscopic Examination See below:   PSA  Result Value Ref Range   Prostate Specific Ag, Serum 1.6 0.0 - 4.0 ng/mL      Assessment & Plan:   Problem List Items Addressed This Visit      Cardiovascular and Mediastinum   Bilateral carotid artery disease (HCC)    Followed by vascular and last seen 12/09/19.  Recommend continue statin and ASA daily.      Essential (primary) hypertension    Chronic, stable with BP at goal today. Recommend he monitor BP at least a few mornings a week at home and document.  DASH diet at home.  Continue current medication regimen and adjust as needed.  Labs next visit, patient wishes to defer labs today.  Return in 6 months.  Could consider in future switching to Losartan vs Lisinopril due to underlying emphysema.  Next visit check BMP, urine ALB, and TSH.       Aortic atherosclerosis (HCC)     Noted on CT imaging 11/05/2018.  Educated patient on this finding.  Recommend continued cessation of smoking and to continue statin daily + ASA for prevention.        Respiratory   Chronic obstructive pulmonary disease (HCC) - Primary    Chronic, ongoing.  Noted on CT imaging, he reports not realizing diagnosis was present.  Educated him on this diagnosis.  No current inhalers or symptoms.  Recommend continued cessation of smoking and obtain PFTs next visit to further assess.        Genitourinary   Chronic kidney disease, stage 3 (HCC) (Chronic)    Ongoing, stable, noted on labs in 2020 to present.  He wishes to defer labs today.  Recheck next visit and educated him on kidney disease.  If progression consider referral to nephrology.        Other   Hyperlipidemia    Chronic, ongoing.  Continue current medication regimen and adjust as needed, if LDL above goal on next visit labs consider increase Atorvastatin to 40 MG.  Lipid panel next visit, he wishes to defer labs today.         Former smoker  Quit in September 2021.  Recommend continued cessation of smoking to aid in prevention.          Follow up plan: Return in about 6 months (around 10/10/2020) for HTN/HLD, CKD, COPD -- to meet new PCP and needs spirometry.

## 2020-04-26 ENCOUNTER — Other Ambulatory Visit: Payer: Self-pay | Admitting: Family Medicine

## 2020-05-19 ENCOUNTER — Telehealth: Payer: Self-pay | Admitting: *Deleted

## 2020-05-19 NOTE — Telephone Encounter (Signed)
I called patient and asked him if he still wanted to do the lung cancer screening scan.  Patient wanted to do this however he has no transportation in order to get that done.  I did not ask him all the screening questions because he had already been approved and had an appointment on December 12/2019.  I assumed if he was qualified in December he would still be qualifying for February.  But the patient cannot come unless someone helps him with transportation.  I told the patient that I would send a message to [facility] and see if there is any way that transportation could be helped for him in order to get his lung screening.  I told patient that after Gregary Signs sees the message he will return phone call if there is any way that he could help transportation wise.  Patient agreeable to plan

## 2020-05-26 NOTE — Telephone Encounter (Signed)
Attempted to give appt for lung screening scan with transportation provided. However, there is no answer or voicemail option available. Will try to contact at a later date.

## 2020-06-10 ENCOUNTER — Encounter: Payer: Self-pay | Admitting: *Deleted

## 2020-06-10 ENCOUNTER — Telehealth: Payer: Self-pay | Admitting: *Deleted

## 2020-06-10 NOTE — Telephone Encounter (Signed)
Attempted again to contact patient in attempt to arrange transportation if needed and lung screening annual scan. However there is no answer or voicemail option. Will mail letter in attempt to contact.

## 2020-10-24 ENCOUNTER — Other Ambulatory Visit: Payer: Self-pay | Admitting: Nurse Practitioner

## 2020-10-25 ENCOUNTER — Ambulatory Visit (INDEPENDENT_AMBULATORY_CARE_PROVIDER_SITE_OTHER): Payer: Medicare HMO

## 2020-10-25 VITALS — Ht 72.0 in | Wt 145.0 lb

## 2020-10-25 DIAGNOSIS — Z0000 Encounter for general adult medical examination without abnormal findings: Secondary | ICD-10-CM | POA: Diagnosis not present

## 2020-10-25 NOTE — Progress Notes (Signed)
I connected with Aaron Evenerobert [employer] today by telephone and verified that I am speaking with the correct person using two identifiers. Location patient: home Location provider: work Persons participating in the virtual visit: Aaron Boyd, Elisha PonderNickeah Cecelia Graciano LPN.   I discussed the limitations, risks, security and privacy concerns of performing an evaluation and management service by telephone and the availability of in person appointments. I also discussed with the patient that there may be a patient responsible charge related to this service. The patient expressed understanding and verbally consented to this telephonic visit.    Interactive audio and video telecommunications were attempted between this provider and patient, however failed, due to patient having technical difficulties OR patient did not have access to video capability.  We continued and completed visit with audio only.     Vital signs may be patient reported or missing.  Subjective:   Aaron LaineRobert D Boyd is a 70 y.o. male who presents for Medicare Annual/Subsequent preventive examination.  Review of Systems     Cardiac Risk Factors include: advanced age (>3655men, 58>65 women);dyslipidemia;hypertension;male gender     Objective:    Today's Vitals   10/25/20 1341  Weight: 145 lb (65.8 kg)  Height: 6' (1.829 m)   Body mass index is 19.67 kg/m.  Advanced Directives 10/25/2020 10/24/2019 10/07/2018 10/03/2017 09/05/2016 08/08/2016  Does Patient Have a Medical Advance Directive? Yes Yes Yes Yes Yes Yes  Type of Estate agentAdvance Directive Healthcare Power of MoffatAttorney;Living will Healthcare Power of BryantAttorney;Living will Living will;Healthcare Power of Attorney Living will;Healthcare Power of State Street Corporationttorney Healthcare Power of [place];Living will -  Does patient want to make changes to medical advance directive? - - - - - No - Patient declined  Copy of Healthcare Power of Attorney in Chart? No - copy requested No - copy requested No - copy requested No - copy  requested - -    Current Medications (verified) Outpatient Encounter Medications as of 10/25/2020  Medication Sig   acetaminophen (TYLENOL) 325 MG tablet Take 650 mg by mouth every 6 (six) hours as needed.   amLODipine (NORVASC) 5 MG tablet TAKE 1 TABLET EVERY DAY   aspirin EC 81 MG tablet Take 81 mg by mouth daily. Swallow whole.   atorvastatin (LIPITOR) 20 MG tablet TAKE 1 TABLET EVERY DAY   lisinopril-hydrochlorothiazide (ZESTORETIC) 20-25 MG tablet TAKE 1 TABLET EVERY DAY   diclofenac Sodium (VOLTAREN) 1 % GEL Apply 2 g topically 4 (four) times daily. (Patient not taking: Reported on 10/25/2020)   tiZANidine (ZANAFLEX) 2 MG tablet Take 1 tablet (2 mg total) by mouth every 6 (six) hours as needed for muscle spasms. Do not sure while driving or operating heavy machinery - may make you drowsy. (Patient not taking: Reported on 10/25/2020)   No facility-administered encounter medications on file as of 10/25/2020.    Allergies (verified) Patient has no known allergies.   History: Past Medical History:  Diagnosis Date   CVA (cerebral infarction) 2005   ED (erectile dysfunction)    Hematuria    Hyperlipidemia    Hypertension    Past Surgical History:  Procedure Laterality Date   clavicle surgery  1970   EYE SURGERY Left    left ear trauma     varicose veins     Family History  Problem Relation Age of Onset   Diabetes Mother    Heart disease Father    Hypertension Father    Heart disease Brother    Social History   Socioeconomic History   Marital  status: Widowed    Spouse name: Not on file   Number of children: Not on file   Years of education: Not on file   Highest education level: High school graduate  Occupational History   Occupation: retired  Tobacco Use   Smoking status: Former    Packs/day: 0.50    Years: 44.00    Pack years: 22.00    Types: Cigarettes    Quit date: 01/15/2020    Years since quitting: 0.7   Smokeless tobacco: Never   Tobacco comments:     quit 06/17/2016, restarted smoking 03/2018  Vaping Use   Vaping Use: Former   Quit date: 05/04/2017  Substance and Sexual Activity   Alcohol use: Not Currently    Alcohol/week: 0.0 standard drinks    Comment: on occasion   Drug use: No   Sexual activity: Not Currently  Other Topics Concern   Not on file  Social History Narrative   Not on file   Social Determinants of Health   Financial Resource Strain: Low Risk    Difficulty of Paying Living Expenses: Not hard at all  Food Insecurity: No Food Insecurity   Worried About Programme researcher, broadcasting/film/video in the Last Year: Never true   Ran Out of Food in the Last Year: Never true  Transportation Needs: No Transportation Needs   Lack of Transportation (Medical): No   Lack of Transportation (Non-Medical): No  Physical Activity: Insufficiently Active   Days of Exercise per Week: 3 days   Minutes of Exercise per Session: 30 min  Stress: No Stress Concern Present   Feeling of Stress : Not at all  Social Connections: Not on file    Tobacco Counseling Counseling given: Not Answered Tobacco comments: quit 06/17/2016, restarted smoking 03/2018   Clinical Intake:  Pre-visit preparation completed: Yes  Pain : No/denies pain     Nutritional Status: BMI of 19-24  Normal Nutritional Risks: Nausea/ vomitting/ diarrhea (diarrhea after eating) Diabetes: No  How often do you need to have someone help you when you read instructions, pamphlets, or other written materials from your doctor or pharmacy?: 1 - Never What is the last grade level you completed in school?: 12th grade  Diabetic? no  Interpreter Needed?: No  Information entered by :: NAllen LPN   Activities of Daily Living In your present state of health, do you have any difficulty performing the following activities: 10/25/2020  Hearing? Y  Comment a little  Vision? N  Difficulty concentrating or making decisions? N  Walking or climbing stairs? N  Dressing or bathing? N  Doing  errands, shopping? N  Preparing Food and eating ? N  Using the Toilet? N  In the past six months, have you accidently leaked urine? N  Do you have problems with loss of bowel control? N  Managing your Medications? N  Managing your Finances? N  Housekeeping or managing your Housekeeping? N  Some recent data might be hidden    Patient Care Team: Larae Grooms, NP as PCP - General  Indicate any recent Medical Services you may have received from other than [facility] providers in the past year (date may be approximate).     Assessment:   This is a routine wellness examination for Lowen.  Hearing/Vision screen No results found.  Dietary issues and exercise activities discussed: Current Exercise Habits: Home exercise routine, Type of exercise: walking, Time (Minutes): 30, Frequency (Times/Week): 3, Weekly Exercise (Minutes/Week): 90   Goals Addressed  This Visit's Progress    Patient Stated       10/25/2020, no goals       Depression Screen PHQ 2/9 Scores 10/25/2020 04/12/2020 10/24/2019 10/10/2019 10/10/2018 10/07/2018 10/03/2017  PHQ - 2 Score 0 0 0 1 0 0 0  PHQ- 9 Score - - - 3 2 - -    Fall Risk Fall Risk  10/25/2020 04/12/2020 10/24/2019 10/10/2019 10/10/2018  Falls in the past year? 0 0 0 0 0  Number falls in past yr: - - - 0 0  Injury with Fall? - - - 0 0  Risk for fall due to : Medication side effect - Medication side effect - -  Follow up Falls evaluation completed;Education provided;Falls prevention discussed - Falls evaluation completed;Education provided;Falls prevention discussed - -    FALL RISK PREVENTION PERTAINING TO THE HOME:  Any stairs in or around the home? Yes  If so, are there any without handrails? No  Home free of loose throw rugs in walkways, pet beds, electrical cords, etc? Yes  Adequate lighting in your home to reduce risk of falls? Yes   ASSISTIVE DEVICES UTILIZED TO PREVENT FALLS:  Life alert? No  Use of a cane, walker or w/c? No  Grab  bars in the bathroom? No  Shower chair or bench in shower? No  Elevated toilet seat or a handicapped toilet? No   TIMED UP AND GO:  Was the test performed? No .      Cognitive Function:     6CIT Screen 10/25/2020 10/24/2019 10/03/2017  What Year? 0 points 0 points 0 points  What month? 0 points 0 points 0 points  What time? 0 points 0 points 0 points  Count back from 20 0 points 0 points 0 points  Months in reverse 0 points 0 points 0 points  Repeat phrase 2 points 0 points 2 points  Total Score 2 0 2    Immunizations Immunization History  Administered Date(s) Administered   Fluad Quad(high Dose 65+) 04/16/2019, 01/29/2020   Influenza, High Dose Seasonal PF 12/06/2016, 04/08/2018   Influenza,inj,Quad PF,6+ Mos 01/19/2015, 01/07/2016   Influenza-Unspecified 02/09/2014   Moderna Sars-Covid-2 Vaccination 11/24/2019, 12/28/2019   Pneumococcal Conjugate-13 08/08/2016   Pneumococcal Polysaccharide-23 10/03/2017   Td 10/10/2019    TDAP status: Up to date  Flu Vaccine status: Up to date  Pneumococcal vaccine status: Up to date  Covid-19 vaccine status: Completed vaccines  Qualifies for Shingles Vaccine? Yes   Zostavax completed No   Shingrix Completed?: No.    Education has been provided regarding the importance of this vaccine. Patient has been advised to call insurance company to determine out of pocket expense if they have not yet received this vaccine. Advised may also receive vaccine at local pharmacy or Health Dept. Verbalized acceptance and understanding.  Screening Tests Health Maintenance  Topic Date Due   Zoster Vaccines- Shingrix (1 of 2) Never done   COVID-19 Vaccine (3 - Booster for Moderna series) 05/29/2020   COLONOSCOPY (Pts 45-75yrs Insurance coverage will need to be confirmed)  06/01/2020   INFLUENZA VACCINE  11/01/2020   TETANUS/TDAP  10/09/2029   Hepatitis C Screening  Completed   PNA vac Low Risk Adult  Completed   HPV VACCINES  Aged Out     Health Maintenance  Health Maintenance Due  Topic Date Due   Zoster Vaccines- Shingrix (1 of 2) Never done   COVID-19 Vaccine (3 - Booster for Moderna series) 05/29/2020   COLONOSCOPY (Pts 45-23yrs  Insurance coverage will need to be confirmed)  06/01/2020    Colorectal cancer screening: Type of screening: Colonoscopy. Completed 06/02/2010. Repeat every 10 years  Lung Cancer Screening: (Low Dose CT Chest recommended if Age 34-80 years, 30 pack-year currently smoking OR have quit w/in 15years.) does not qualify.   Lung Cancer Screening Referral: no  Additional Screening:  Hepatitis C Screening: does qualify; Completed 10/03/2017  Vision Screening: Recommended annual ophthalmology exams for early detection of glaucoma and other disorders of the eye. Is the patient up to date with their annual eye exam?  Yes  Who is the provider or what is the name of the office in which the patient attends annual eye exams? [facility] If pt is not established with a provider, would they like to be referred to a provider to establish care? No .   Dental Screening: Recommended annual dental exams for proper oral hygiene  Community Resource Referral / Chronic Care Management: CRR required this visit?  No   CCM required this visit?  No      Plan:     I have personally reviewed and noted the following in the patient's chart:   Medical and social history Use of alcohol, tobacco or illicit drugs  Current medications and supplements including opioid prescriptions. Patient is not currently taking opioid prescriptions. Functional ability and status Nutritional status Physical activity Advanced directives List of other physicians Hospitalizations, surgeries, and ER visits in previous 12 months Vitals Screenings to include cognitive, depression, and falls Referrals and appointments  In addition, I have reviewed and discussed with patient certain preventive protocols, quality metrics,  and best practice recommendations. A written personalized care plan for preventive services as well as general preventive health recommendations were provided to patient.     Barb Merino, LPN   09/28/3149   Nurse Notes:

## 2020-10-25 NOTE — Patient Instructions (Signed)
Aaron Boyd , Thank you for taking time to come for your Medicare Wellness Visit. I appreciate your ongoing commitment to your health goals. Please review the following plan we discussed and let me know if I can assist you in the future.   Screening recommendations/referrals: Colonoscopy: completed 06/02/2010, due Recommended yearly ophthalmology/optometry visit for glaucoma screening and checkup Recommended yearly dental visit for hygiene and checkup  Vaccinations: Influenza vaccine: completed 01/29/2020, due 11/01/2020 Pneumococcal vaccine: completed 10/03/2017 Tdap vaccine: completed 10/10/2019, due 10/09/2029 Shingles vaccine: discussed   Covid-19:  12/28/2019, 11/24/2019  Advanced directives: Please bring a copy of your POA (Power of Attorney) and/or Living Will to your next appointment.   Conditions/risks identified: none  Next appointment: Follow up in one year for your annual wellness visit.   Preventive Care 38 Years and Older, Male Preventive care refers to lifestyle choices and visits with your health care provider that can promote health and wellness. What does preventive care include? A yearly physical exam. This is also called an annual well check. Dental exams once or twice a year. Routine eye exams. Ask your health care provider how often you should have your eyes checked. Personal lifestyle choices, including: Daily care of your teeth and gums. Regular physical activity. Eating a healthy diet. Avoiding tobacco and drug use. Limiting alcohol use. Practicing safe sex. Taking low doses of aspirin every day. Taking vitamin and mineral supplements as recommended by your health care provider. What happens during an annual well check? The services and screenings done by your health care provider during your annual well check will depend on your age, overall health, lifestyle risk factors, and family history of disease. Counseling  Your health care provider may ask you questions  about your: Alcohol use. Tobacco use. Drug use. Emotional well-being. Home and relationship well-being. Sexual activity. Eating habits. History of falls. Memory and ability to understand (cognition). Work and work Astronomer. Screening  You may have the following tests or measurements: Height, weight, and BMI. Blood pressure. Lipid and cholesterol levels. These may be checked every 5 years, or more frequently if you are over 50 years old. Skin check. Lung cancer screening. You may have this screening every year starting at age 36 if you have a 30-pack-year history of smoking and currently smoke or have quit within the past 15 years. Fecal occult blood test (FOBT) of the stool. You may have this test every year starting at age 34. Flexible sigmoidoscopy or colonoscopy. You may have a sigmoidoscopy every 5 years or a colonoscopy every 10 years starting at age 64. Prostate cancer screening. Recommendations will vary depending on your family history and other risks. Hepatitis C blood test. Hepatitis B blood test. Sexually transmitted disease (STD) testing. Diabetes screening. This is done by checking your blood sugar (glucose) after you have not eaten for a while (fasting). You may have this done every 1-3 years. Abdominal aortic aneurysm (AAA) screening. You may need this if you are a current or former smoker. Osteoporosis. You may be screened starting at age 80 if you are at high risk. Talk with your health care provider about your test results, treatment options, and if necessary, the need for more tests. Vaccines  Your health care provider may recommend certain vaccines, such as: Influenza vaccine. This is recommended every year. Tetanus, diphtheria, and acellular pertussis (Tdap, Td) vaccine. You may need a Td booster every 10 years. Zoster vaccine. You may need this after age 4. Pneumococcal 13-valent conjugate (PCV13) vaccine. One dose  is recommended after age 70. Pneumococcal  polysaccharide (PPSV23) vaccine. One dose is recommended after age 75. Talk to your health care provider about which screenings and vaccines you need and how often you need them. This information is not intended to replace advice given to you by your health care provider. Make sure you discuss any questions you have with your health care provider. Document Released: 04/16/2015 Document Revised: 12/08/2015 Document Reviewed: 01/19/2015 Elsevier Interactive Patient Education  2017 [place] Prevention in the Home Falls can cause injuries. They can happen to people of all ages. There are many things you can do to make your home safe and to help prevent falls. What can I do on the outside of my home? Regularly fix the edges of walkways and driveways and fix any cracks. Remove anything that might make you trip as you walk through a door, such as a raised step or threshold. Trim any bushes or trees on the path to your home. Use bright outdoor lighting. Clear any walking paths of anything that might make someone trip, such as rocks or tools. Regularly check to see if handrails are loose or broken. Make sure that both sides of any steps have handrails. Any raised decks and porches should have guardrails on the edges. Have any leaves, snow, or ice cleared regularly. Use sand or salt on walking paths during winter. Clean up any spills in your garage right away. This includes oil or grease spills. What can I do in the bathroom? Use night lights. Install grab bars by the toilet and in the tub and shower. Do not use towel bars as grab bars. Use non-skid mats or decals in the tub or shower. If you need to sit down in the shower, use a plastic, non-slip stool. Keep the floor dry. Clean up any water that spills on the floor as soon as it happens. Remove soap buildup in the tub or shower regularly. Attach bath mats securely with double-sided non-slip rug tape. Do not have throw rugs and other  things on the floor that can make you trip. What can I do in the bedroom? Use night lights. Make sure that you have a light by your bed that is easy to reach. Do not use any sheets or blankets that are too big for your bed. They should not hang down onto the floor. Have a firm chair that has side arms. You can use this for support while you get dressed. Do not have throw rugs and other things on the floor that can make you trip. What can I do in the kitchen? Clean up any spills right away. Avoid walking on wet floors. Keep items that you use a lot in easy-to-reach places. If you need to reach something above you, use a strong step stool that has a grab bar. Keep electrical cords out of the way. Do not use floor polish or wax that makes floors slippery. If you must use wax, use non-skid floor wax. Do not have throw rugs and other things on the floor that can make you trip. What can I do with my stairs? Do not leave any items on the stairs. Make sure that there are handrails on both sides of the stairs and use them. Fix handrails that are broken or loose. Make sure that handrails are as long as the stairways. Check any carpeting to make sure that it is firmly attached to the stairs. Fix any carpet that is loose or worn. Avoid  having throw rugs at the top or bottom of the stairs. If you do have throw rugs, attach them to the floor with carpet tape. Make sure that you have a light switch at the top of the stairs and the bottom of the stairs. If you do not have them, ask someone to add them for you. What else can I do to help prevent falls? Wear shoes that: Do not have high heels. Have rubber bottoms. Are comfortable and fit you well. Are closed at the toe. Do not wear sandals. If you use a stepladder: Make sure that it is fully opened. Do not climb a closed stepladder. Make sure that both sides of the stepladder are locked into place. Ask someone to hold it for you, if possible. Clearly  mark and make sure that you can see: Any grab bars or handrails. First and last steps. Where the edge of each step is. Use tools that help you move around (mobility aids) if they are needed. These include: Canes. Walkers. Scooters. Crutches. Turn on the lights when you go into a dark area. Replace any light bulbs as soon as they burn out. Set up your furniture so you have a clear path. Avoid moving your furniture around. If any of your floors are uneven, fix them. If there are any pets around you, be aware of where they are. Review your medicines with your doctor. Some medicines can make you feel dizzy. This can increase your chance of falling. Ask your doctor what other things that you can do to help prevent falls. This information is not intended to replace advice given to you by your health care provider. Make sure you discuss any questions you have with your health care provider. Document Released: 01/14/2009 Document Revised: 08/26/2015 Document Reviewed: 04/24/2014 Elsevier Interactive Patient Education  2017 [employer].

## 2020-10-25 NOTE — Telephone Encounter (Signed)
Notes to clinic:  Patient has a nurse visit on 10/25/2020 Due for follow up 10/2020 to meet new provider  Review for refill   Requested Prescriptions  Pending Prescriptions Disp Refills   lisinopril-hydrochlorothiazide (ZESTORETIC) 20-25 MG tablet [Pharmacy Med Name: LISINOPRIL/HYDROCHLOROTHIAZIDE 20-25 MG Tablet] 90 tablet 1    Sig: TAKE 1 TABLET EVERY DAY      Cardiovascular:  ACEI + Diuretic Combos Failed - 10/24/2020  7:56 PM      Failed - Na in normal range and within 180 days    Sodium  Date Value Ref Range Status  10/10/2019 135 134 - 144 mmol/L Final          Failed - K in normal range and within 180 days    Potassium  Date Value Ref Range Status  10/10/2019 4.8 3.5 - 5.2 mmol/L Final          Failed - Cr in normal range and within 180 days    Creatinine, Ser  Date Value Ref Range Status  10/10/2019 1.52 (H) 0.76 - 1.27 mg/dL Final          Failed - Ca in normal range and within 180 days    Calcium  Date Value Ref Range Status  10/10/2019 9.2 8.6 - 10.2 mg/dL Final          Failed - Valid encounter within last 6 months    Recent Outpatient Visits           6 months ago Centrilobular emphysema (HCC)   [facility] [place], Corrie Dandy T, NP   9 months ago Neck pain   [facility] Valentino Nose, NP   1 year ago Bilateral carotid artery stenosis   [facility] Particia Nearing, PA-C   1 year ago Flu vaccine need   [facility] Particia Nearing, [place]   2 years ago Hypertensive renal disease   [facility] [place], Salley Hews, [place]       Future Appointments             Today [facility], [facility]              Passed - Patient is not pregnant      Passed - Last BP in normal range    BP Readings from Last 1 Encounters:  04/12/20 114/71            atorvastatin (LIPITOR) 20 MG tablet [Pharmacy Med Name: ATORVASTATIN CALCIUM 20 MG Tablet] 90 tablet 1    Sig:  TAKE 1 TABLET EVERY DAY      Cardiovascular:  Antilipid - Statins Failed - 10/24/2020  7:56 PM      Failed - Total Cholesterol in normal range and within 360 days    Cholesterol, Total  Date Value Ref Range Status  10/10/2019 166 100 - 199 mg/dL Final   Cholesterol Piccolo, Waived  Date Value Ref Range Status  01/19/2015 138 <200 mg/dL Final    Comment:                            Desirable                <200                         Borderline High      200- 239  High                     >239           Failed - LDL in normal range and within 360 days    LDL Chol Calc (NIH)  Date Value Ref Range Status  10/10/2019 103 (H) 0 - 99 mg/dL Final          Failed - HDL in normal range and within 360 days    HDL  Date Value Ref Range Status  10/10/2019 49 >39 mg/dL Final          Failed - Triglycerides in normal range and within 360 days    Triglycerides  Date Value Ref Range Status  10/10/2019 73 0 - 149 mg/dL Final   Triglycerides Piccolo,Waived  Date Value Ref Range Status  01/19/2015 117 <150 mg/dL Final    Comment:                            Normal                   <150                         Borderline High     150 - 199                         High                200 - 499                         Very High                >499           Passed - Patient is not pregnant      Passed - Valid encounter within last 12 months    Recent Outpatient Visits           6 months ago Centrilobular emphysema (HCC)   [facility] [place], Corrie Dandy T, NP   9 months ago Neck pain   [facility] Valentino Nose, NP   1 year ago Bilateral carotid artery stenosis   [facility] Particia Nearing, [place]   1 year ago Flu vaccine need   [facility] Particia Nearing, [place]   2 years ago Hypertensive renal disease   [facility] [place], Salley Hews, [place]       Future  Appointments             Today [facility], [facility]                amLODipine (NORVASC) 5 MG tablet [Pharmacy Med Name: AMLODIPINE BESYLATE 5 MG Tablet] 90 tablet 1    Sig: TAKE 1 TABLET EVERY DAY      Cardiovascular:  Calcium Channel Blockers Failed - 10/24/2020  7:56 PM      Failed - Valid encounter within last 6 months    Recent Outpatient Visits           6 months ago Centrilobular emphysema (HCC)   [facility] [place], Corrie Dandy T, NP   9 months ago Neck pain   [facility] Cathlean [place]se[place] A, NP   1 year ago Bilateral carotid artery stenosis  [facility] Particia Nearing, [place]   1 year ago Flu vaccine need   [facility] Roosvelt Maser [place], [place]   2 years ago Hypertensive renal disease   [facility] Roosvelt Maser [place], [place]       Future Appointments             Today [facility], [facility]              Passed - Last BP in normal range    BP Readings from Last 1 Encounters:  04/12/20 114/71

## 2020-10-27 NOTE — Telephone Encounter (Signed)
30 day supply sent for patient to make appt. 

## 2020-10-27 NOTE — Telephone Encounter (Signed)
Please advise 

## 2020-11-26 ENCOUNTER — Ambulatory Visit (INDEPENDENT_AMBULATORY_CARE_PROVIDER_SITE_OTHER): Payer: Medicare HMO | Admitting: Nurse Practitioner

## 2020-11-26 ENCOUNTER — Other Ambulatory Visit: Payer: Self-pay

## 2020-11-26 ENCOUNTER — Encounter: Payer: Self-pay | Admitting: Nurse Practitioner

## 2020-11-26 VITALS — BP 136/81 | HR 94 | Ht 72.68 in | Wt 158.0 lb

## 2020-11-26 DIAGNOSIS — J432 Centrilobular emphysema: Secondary | ICD-10-CM

## 2020-11-26 DIAGNOSIS — I10 Essential (primary) hypertension: Secondary | ICD-10-CM | POA: Diagnosis not present

## 2020-11-26 DIAGNOSIS — E782 Mixed hyperlipidemia: Secondary | ICD-10-CM

## 2020-11-26 DIAGNOSIS — I700 Atherosclerosis of aorta: Secondary | ICD-10-CM | POA: Diagnosis not present

## 2020-11-26 DIAGNOSIS — N1831 Chronic kidney disease, stage 3a: Secondary | ICD-10-CM

## 2020-11-26 DIAGNOSIS — I6523 Occlusion and stenosis of bilateral carotid arteries: Secondary | ICD-10-CM

## 2020-11-26 MED ORDER — LISINOPRIL-HYDROCHLOROTHIAZIDE 20-25 MG PO TABS: 1.0000 | ORAL_TABLET | Freq: Every day | ORAL | 1 refills | Status: DC

## 2020-11-26 MED ORDER — AMLODIPINE BESYLATE 5 MG PO TABS: 5.0000 mg | ORAL_TABLET | Freq: Every day | ORAL | 1 refills | Status: DC

## 2020-11-26 MED ORDER — ATORVASTATIN CALCIUM 20 MG PO TABS: 20.0000 mg | ORAL_TABLET | Freq: Every day | ORAL | 1 refills | Status: DC

## 2020-11-26 NOTE — Assessment & Plan Note (Signed)
Noted on CT imaging 11/05/2018.  Patient has not smoked since October 2021. Recommend continued cessation of smoking and to continue statin daily + ASA for prevention.

## 2020-11-26 NOTE — Assessment & Plan Note (Signed)
Chronic.  Controlled.  Continue with current medication regimen.  Labs ordered today. Will make further recommendations based on lab results. Return to clinic in 6 months for reevaluation.  Call sooner if concerns arise.

## 2020-11-26 NOTE — Assessment & Plan Note (Signed)
Chronic.  Controlled.  Continue with current medication regimen of Atorvastatin 20mg  daily.  Continue with smoking cessation. Labs ordered today.  Return to clinic in 6 months for reevaluation.  Call sooner if concerns arise.

## 2020-11-26 NOTE — Progress Notes (Signed)
BP 136/81   Pulse 94   Ht 6' 0.68" (1.846 m)   Wt 158 lb (71.7 kg)   SpO2 97%   BMI 21.03 kg/m    Subjective:    Patient ID: Aaron Boyd, male    DOB: February 09, 1951, 70 y.o.   MRN: [id number]  HPI: Aaron Boyd is a 70 y.o. male  Chief Complaint  Patient presents with   Hypertension   Hyperlipidemia   HYPERTENSION / [place] Satisfied with current treatment? yes Duration of hypertension: years BP monitoring frequency: not checking BP range:  BP medication side effects: no Past BP meds: amlodipine and lisinopril-HCTZ Duration of hyperlipidemia: years Cholesterol medication side effects: no Cholesterol supplements: none Past cholesterol medications: atorvastain (lipitor) Medication compliance: excellent compliance Aspirin: yes Recent stressors: no Recurrent headaches: no Visual changes: no Palpitations: no Dyspnea: yes Chest pain: no Lower extremity edema: no Dizzy/lightheaded: no  CHRONIC KIDNEY DISEASE CKD status: controlled Medications renally dose: yes Previous renal evaluation: no Pneumovax:  Up to Date Influenza Vaccine:  Up to Date  COPD COPD status: controlled Satisfied with current treatment?: yes Oxygen use: no Dyspnea frequency: when he is really active Cough frequency: no Rescue inhaler frequency:  not using Limitation of activity: yes Productive cough:  Last Spirometry:  Pneumovax: Up to Date Influenza: Up to Date  Relevant past medical, surgical, family and social history reviewed and updated as indicated. Interim medical history since our last visit reviewed. Allergies and medications reviewed and updated.  Review of Systems  Eyes:  Negative for visual disturbance.  Respiratory:  Negative for chest tightness and shortness of breath.   Cardiovascular:  Negative for chest pain, palpitations and leg swelling.  Neurological:  Negative for dizziness, light-headedness and headaches.   Per HPI unless specifically indicated  above     Objective:    BP 136/81   Pulse 94   Ht 6' 0.68" (1.846 m)   Wt 158 lb (71.7 kg)   SpO2 97%   BMI 21.03 kg/m   Wt Readings from Last 3 Encounters:  11/26/20 158 lb (71.7 kg)  10/25/20 145 lb (65.8 kg)  04/12/20 151 lb (68.5 kg)    Physical Exam Vitals and nursing note reviewed.  Constitutional:      General: He is not in acute distress.    Appearance: Normal appearance. He is not ill-appearing, toxic-appearing or diaphoretic.  HENT:     Head: Normocephalic.     Right Ear: External ear normal.     Left Ear: External ear normal.     Nose: Nose normal. No congestion or rhinorrhea.     Mouth/Throat:     Mouth: Mucous membranes are moist.  Eyes:     General:        Right eye: No discharge.        Left eye: No discharge.     Extraocular Movements: Extraocular movements intact.     Conjunctiva/sclera: Conjunctivae normal.     Pupils: Pupils are equal, round, and reactive to light.  Cardiovascular:     Rate and Rhythm: Normal rate and regular rhythm.     Heart sounds: No murmur heard. Pulmonary:     Effort: Pulmonary effort is normal. No respiratory distress.     Breath sounds: Normal breath sounds. No wheezing, rhonchi or rales.  Abdominal:     General: Abdomen is flat. Bowel sounds are normal.  Musculoskeletal:     Cervical back: Normal range of motion and neck supple.  Skin:  General: Skin is warm and dry.     Capillary Refill: Capillary refill takes less than 2 seconds.  Neurological:     General: No focal deficit present.     Mental Status: He is alert and oriented to person, place, and time.  Psychiatric:        Mood and Affect: Mood normal.        Behavior: Behavior normal.        Thought Content: Thought content normal.        Judgment: Judgment normal.    Results for orders placed or performed in visit on 10/10/19  Microscopic Examination   Urine  Result Value Ref Range   WBC, UA None seen 0 - 5 /hpf   RBC 3-10 (A) 0 - 2 /hpf   Epithelial  Cells (non renal) 0-10 0 - 10 /hpf   Bacteria, UA None seen None seen/Few  CBC with Differential/Platelet  Result Value Ref Range   WBC 8.6 3.4 - 10.8 x10E3/uL   RBC 4.84 4.14 - 5.80 x10E6/uL   Hemoglobin 11.5 (L) 13.0 - 17.7 g/dL   Hematocrit 37.1 (L) 37.5 - 51.0 %   MCV 77 (L) 79 - 97 fL   MCH 23.8 (L) 26.6 - 33.0 pg   MCHC 31.0 (L) 31.5 - 35.7 g/dL   RDW 15.6 (H) 11.6 - 15.4 %   Platelets 291 150 - 450 x10E3/uL   Neutrophils 64 Not Estab. %   Lymphs 27 Not Estab. %   Monocytes 6 Not Estab. %   Eos 2 Not Estab. %   Basos 1 Not Estab. %   Neutrophils Absolute 5.4 1.4 - 7.0 x10E3/uL   Lymphocytes Absolute 2.3 0.7 - 3.1 x10E3/uL   Monocytes Absolute 0.5 0.1 - 0.9 x10E3/uL   EOS (ABSOLUTE) 0.2 0.0 - 0.4 x10E3/uL   Basophils Absolute 0.1 0.0 - 0.2 x10E3/uL   Immature Granulocytes 0 Not Estab. %   Immature Grans (Abs) 0.0 0.0 - 0.1 x10E3/uL  Comprehensive metabolic panel  Result Value Ref Range   Glucose 85 65 - 99 mg/dL   BUN 22 8 - 27 mg/dL   Creatinine, Ser 1.52 (H) 0.76 - 1.27 mg/dL   GFR calc non Af Amer 46 (L) >59 mL/min/1.73   GFR calc Af Amer 54 (L) >59 mL/min/1.73   BUN/Creatinine Ratio 14 10 - 24   Sodium 135 134 - 144 mmol/L   Potassium 4.8 3.5 - 5.2 mmol/L   Chloride 99 96 - 106 mmol/L   CO2 24 20 - 29 mmol/L   Calcium 9.2 8.6 - 10.2 mg/dL   Total Protein 7.4 6.0 - 8.5 g/dL   Albumin 4.3 3.8 - 4.8 g/dL   Globulin, Total 3.1 1.5 - 4.5 g/dL   Albumin/Globulin Ratio 1.4 1.2 - 2.2   Bilirubin Total 0.2 0.0 - 1.2 mg/dL   Alkaline Phosphatase 69 48 - 121 IU/L   AST 16 0 - 40 IU/L   ALT 11 0 - 44 IU/L  Lipid Panel w/o Chol/HDL Ratio  Result Value Ref Range   Cholesterol, Total 166 100 - 199 mg/dL   Triglycerides 73 0 - 149 mg/dL   HDL 49 >39 mg/dL   VLDL Cholesterol Cal 14 5 - 40 mg/dL   LDL Chol Calc (NIH) 103 (H) 0 - 99 mg/dL  UA/M w/rflx Culture, Routine   Specimen: Urine, Clean Catch   Urine  Result Value Ref Range   Specific Gravity, UA 1.015 1.005 -  1.030   pH,  UA 6.5 5.0 - 7.5   Color, UA Yellow Yellow   Appearance Ur Clear Clear   Leukocytes,UA Negative Negative   Protein,UA Negative Negative/Trace   Glucose, UA Negative Negative   Ketones, UA Negative Negative   RBC, UA 2+ (A) Negative   Bilirubin, UA Negative Negative   Urobilinogen, Ur 0.2 0.2 - 1.0 mg/dL   Nitrite, UA Negative Negative   Microscopic Examination See below:   PSA  Result Value Ref Range   Prostate Specific Ag, Serum 1.6 0.0 - 4.0 ng/mL      Assessment & Plan:   Problem List Items Addressed This Visit       Cardiovascular and Mediastinum   Bilateral carotid artery disease ([place])    Followed by vascular and last seen 12/09/19, told to follow up in 1 year.  Recommend continue statin and ASA daily.      Relevant Medications   amLODipine (NORVASC) 5 MG tablet   atorvastatin (LIPITOR) 20 MG tablet   lisinopril-hydrochlorothiazide (ZESTORETIC) 20-25 MG tablet   Other Relevant Orders   Lipid Profile   Essential (primary) hypertension    Chronic, stable with BP at goal today. Continue with Lisinopril 42m, HCTZ 252m and Amlodipine 61m861mRefills sent today.  Recommend he monitor BP at least a few mornings a week at home and document.  DASH diet at home.  Continue current medication regimen and adjust as needed.  Labs next visit, patient wishes to defer labs today.  Return in 6 months.        Relevant Medications   amLODipine (NORVASC) 5 MG tablet   atorvastatin (LIPITOR) 20 MG tablet   lisinopril-hydrochlorothiazide (ZESTORETIC) 20-25 MG tablet   Aortic atherosclerosis ([facility]  Noted on CT imaging 11/05/2018.  Patient has not smoked since October 2021. Recommend continued cessation of smoking and to continue statin daily + ASA for prevention.      Relevant Medications   amLODipine (NORVASC) 5 MG tablet   atorvastatin (LIPITOR) 20 MG tablet   lisinopril-hydrochlorothiazide (ZESTORETIC) 20-25 MG tablet   Other Relevant Orders   Lipid Profile      Respiratory   Chronic obstructive pulmonary disease (HCC)    Chronic, ongoing.  Noted on CT imaging, reviewed the diagnosis found on CT with patient again. Educated him on this diagnosis.  No current inhalers or symptoms. Does not feel like he needs them at this time. Recommend continued cessation of smoking.      Relevant Orders   Comp Met (CMET)     Genitourinary   Chronic kidney disease, stage 3 ([facility] (Chronic)    Chronic.  Controlled.  Continue with current medication regimen.  Labs ordered today. Will make further recommendations based on lab results. Return to clinic in 6 months for reevaluation.  Call sooner if concerns arise.        Relevant Orders   Comp Met (CMET)   CBC w/Diff     Other   Hyperlipidemia    Chronic.  Controlled.  Continue with current medication regimen of Atorvastatin 62m48mily.  Continue with smoking cessation. Labs ordered today.  Return to clinic in 6 months for reevaluation.  Call sooner if concerns arise.        Relevant Medications   amLODipine (NORVASC) 5 MG tablet   atorvastatin (LIPITOR) 20 MG tablet   lisinopril-hydrochlorothiazide (ZESTORETIC) 20-25 MG tablet   Other Relevant Orders   Comp Met (CMET)     Follow up plan: Return in about 6 months (  around 05/29/2021) for Physical and Fasting labs.

## 2020-11-26 NOTE — Assessment & Plan Note (Signed)
Chronic, stable with BP at goal today. Continue with Lisinopril 20mg , HCTZ 25mg , and Amlodipine 5mg . Refills sent today.  Recommend he monitor BP at least a few mornings a week at home and document.  DASH diet at home.  Continue current medication regimen and adjust as needed.  Labs next visit, patient wishes to defer labs today.  Return in 6 months.

## 2020-11-26 NOTE — Assessment & Plan Note (Signed)
Followed by vascular and last seen 12/09/19, told to follow up in 1 year.  Recommend continue statin and ASA daily.

## 2020-11-26 NOTE — Assessment & Plan Note (Signed)
Chronic, ongoing.  Noted on CT imaging, reviewed the diagnosis found on CT with patient again. Educated him on this diagnosis.  No current inhalers or symptoms. Does not feel like he needs them at this time. Recommend continued cessation of smoking.

## 2020-11-27 LAB — CBC WITH DIFFERENTIAL/PLATELET
Basophils Absolute: 0.1 10*3/uL (ref 0.0–0.2)
Basos: 1 %
EOS (ABSOLUTE): 0.2 10*3/uL (ref 0.0–0.4)
Eos: 3 %
Hematocrit: 41.8 % (ref 37.5–51.0)
Hemoglobin: 14.9 g/dL (ref 13.0–17.7)
Immature Grans (Abs): 0.1 10*3/uL (ref 0.0–0.1)
Immature Granulocytes: 1 %
Lymphocytes Absolute: 2.7 10*3/uL (ref 0.7–3.1)
Lymphs: 34 %
MCH: 30.9 pg (ref 26.6–33.0)
MCHC: 35.6 g/dL (ref 31.5–35.7)
MCV: 87 fL (ref 79–97)
Monocytes Absolute: 0.6 10*3/uL (ref 0.1–0.9)
Monocytes: 7 %
Neutrophils Absolute: 4.3 10*3/uL (ref 1.4–7.0)
Neutrophils: 54 %
Platelets: 200 10*3/uL (ref 150–450)
RBC: 4.82 x10E6/uL (ref 4.14–5.80)
RDW: 12.8 % (ref 11.6–15.4)
WBC: 7.9 10*3/uL (ref 3.4–10.8)

## 2020-11-27 LAB — COMPREHENSIVE METABOLIC PANEL
ALT: 18 IU/L (ref 0–44)
AST: 21 IU/L (ref 0–40)
Albumin/Globulin Ratio: 1.3 (ref 1.2–2.2)
Albumin: 4.2 g/dL (ref 3.8–4.8)
Alkaline Phosphatase: 73 IU/L (ref 44–121)
BUN/Creatinine Ratio: 15 (ref 10–24)
BUN: 21 mg/dL (ref 8–27)
Bilirubin Total: 0.5 mg/dL (ref 0.0–1.2)
CO2: 23 mmol/L (ref 20–29)
Calcium: 9.1 mg/dL (ref 8.6–10.2)
Chloride: 95 mmol/L — ABNORMAL LOW (ref 96–106)
Creatinine, Ser: 1.44 mg/dL — ABNORMAL HIGH (ref 0.76–1.27)
Globulin, Total: 3.3 g/dL (ref 1.5–4.5)
Glucose: 81 mg/dL (ref 65–99)
Potassium: 4.4 mmol/L (ref 3.5–5.2)
Sodium: 133 mmol/L — ABNORMAL LOW (ref 134–144)
Total Protein: 7.5 g/dL (ref 6.0–8.5)
eGFR: 53 mL/min/{1.73_m2} — ABNORMAL LOW (ref >59)

## 2020-11-27 LAB — LIPID PANEL
Chol/HDL Ratio: 2.6 ratio (ref 0.0–5.0)
Cholesterol, Total: 170 mg/dL (ref 100–199)
HDL: 65 mg/dL (ref >39)
LDL Chol Calc (NIH): 93 mg/dL (ref 0–99)
Triglycerides: 61 mg/dL (ref 0–149)
VLDL Cholesterol Cal: 12 mg/dL (ref 5–40)

## 2020-11-29 NOTE — Progress Notes (Signed)
Please let patient know that his lab work is stable.  We will continue to monitor at future visits.  Please let me know if he has any questions.

## 2020-12-07 ENCOUNTER — Ambulatory Visit (INDEPENDENT_AMBULATORY_CARE_PROVIDER_SITE_OTHER): Payer: Medicare HMO

## 2020-12-07 ENCOUNTER — Other Ambulatory Visit: Payer: Self-pay

## 2020-12-07 ENCOUNTER — Ambulatory Visit (INDEPENDENT_AMBULATORY_CARE_PROVIDER_SITE_OTHER): Payer: Medicare HMO | Admitting: Vascular Surgery

## 2020-12-07 VITALS — BP 123/71 | HR 92 | Ht 72.0 in | Wt 158.0 lb

## 2020-12-07 DIAGNOSIS — I6523 Occlusion and stenosis of bilateral carotid arteries: Secondary | ICD-10-CM

## 2020-12-07 DIAGNOSIS — I10 Essential (primary) hypertension: Secondary | ICD-10-CM

## 2020-12-07 DIAGNOSIS — E782 Mixed hyperlipidemia: Secondary | ICD-10-CM | POA: Diagnosis not present

## 2020-12-07 NOTE — Assessment & Plan Note (Signed)
Duplex today shows a known left internal carotid artery occlusion with stable 40 to 59% right ICA stenosis.  No progression from previous studies.  No role for intervention at this level.  Recheck in 1 year.  Continue current medical regimen

## 2020-12-07 NOTE — Progress Notes (Signed)
MRN : [id number]  Aaron Boyd is a 70 y.o. (1950/09/01) male who presents with chief complaint of  Chief Complaint  Patient presents with   Follow-up    1 yr carotid   .  History of Present Illness: Patient returns in follow-up of his carotid disease.  He is doing well.  He denies any problems or issues since his last visit.  He has a remote history of stroke over 15 years ago, but no recent focal neurologic symptoms. Specifically, the patient denies amaurosis fugax, speech or swallowing difficulties, or arm or leg weakness or numbness. Duplex today shows a known left internal carotid artery occlusion with stable 40 to 59% right ICA stenosis.  No progression from previous studies.  Current Outpatient Medications  Medication Sig Dispense Refill   acetaminophen (TYLENOL) 325 MG tablet Take 650 mg by mouth every 6 (six) hours as needed.     amLODipine (NORVASC) 5 MG tablet Take 1 tablet (5 mg total) by mouth daily. 90 tablet 1   aspirin EC 81 MG tablet Take 81 mg by mouth daily. Swallow whole.     atorvastatin (LIPITOR) 20 MG tablet Take 1 tablet (20 mg total) by mouth daily. 90 tablet 1   lisinopril-hydrochlorothiazide (ZESTORETIC) 20-25 MG tablet Take 1 tablet by mouth daily. 90 tablet 1   No current facility-administered medications for this visit.    Past Medical History:  Diagnosis Date   CVA (cerebral infarction) 2005   ED (erectile dysfunction)    Hematuria    Hyperlipidemia    Hypertension     Past Surgical History:  Procedure Laterality Date   clavicle surgery  1970   EYE SURGERY Left    left ear trauma     varicose veins       Social History   Tobacco Use   Smoking status: Former    Packs/day: 0.50    Years: 44.00    Pack years: 22.00    Types: Cigarettes    Quit date: 01/15/2020    Years since quitting: 0.8   Smokeless tobacco: Never   Tobacco comments:    quit 06/17/2016, restarted smoking 03/2018  Vaping Use   Vaping Use: Former   Quit date:  05/04/2017  Substance Use Topics   Alcohol use: Not Currently    Alcohol/week: 0.0 standard drinks    Comment: on occasion   Drug use: No      Family History  Problem Relation Age of Onset   Diabetes Mother    Heart disease Father    Hypertension Father    Heart disease Brother      No Known Allergies   REVIEW OF SYSTEMS (Negative unless checked)   Constitutional: [] Weight loss  [] Fever  [] Chills Cardiac: [] Chest pain   [] Chest pressure   [] Palpitations   [] Shortness of breath when laying flat   [] Shortness of breath at rest   [] Shortness of breath with exertion. Vascular:  [] Pain in legs with walking   [] Pain in legs at rest   [] Pain in legs when laying flat   [] Claudication   [] Pain in feet when walking  [] Pain in feet at rest  [] Pain in feet when laying flat   [] History of DVT   [] Phlebitis   [] Swelling in legs   [] Varicose veins   [] Non-healing ulcers Pulmonary:   [] Uses home oxygen   [] Productive cough   [] Hemoptysis   [] Wheeze  [] COPD   [] Asthma Neurologic:  [x] Dizziness  [] Blackouts   [] Seizures   [  x]History of stroke   [x] History of TIA  [] Aphasia   [] Temporary blindness   [] Dysphagia   [] Weakness or numbness in arms   [] Weakness or numbness in legs Musculoskeletal:  [] Arthritis   [] Joint swelling   [] Joint pain   [] Low back pain Hematologic:  [] Easy bruising  [] Easy bleeding   [] Hypercoagulable state   [] Anemic  [] Hepatitis Gastrointestinal:  [] Blood in stool   [] Vomiting blood  [] Gastroesophageal reflux/heartburn   [] Difficulty swallowing. Genitourinary:  [] Chronic kidney disease   [] Difficult urination  [] Frequent urination  [] Burning with urination   [x] Blood in urine Skin:  [] Rashes   [] Ulcers   [] Wounds Psychological:  [] History of anxiety   []  History of major depression.  Physical Examination  Vitals:   12/07/20 1437  BP: 123/71  Pulse: 92  Weight: 158 lb (71.7 kg)  Height: 6' (1.829 m)   Body mass index is 21.43 kg/m. Gen:  WD/WN, NAD Head: [place]/AT, No  temporalis wasting. Ear/Nose/Throat: Hearing grossly intact, nares w/o erythema or drainage, trachea midline Eyes: Conjunctiva clear. Sclera non-icteric Neck: Supple.  Bilateral soft carotid bruit  Pulmonary:  Good air movement, equal and clear to auscultation bilaterally.  Cardiac: RRR, No JVD Vascular:  Vessel Right Left  Radial Palpable Palpable           Musculoskeletal: M/S 5/5 throughout.  No deformity or atrophy.  No edema. Neurologic: CN 2-12 intact. Sensation grossly intact in extremities.  Symmetrical.  Speech is fluent. Motor exam as listed above. Psychiatric: Judgment intact, Mood & affect appropriate for pt's clinical situation. Dermatologic: No rashes or ulcers noted.  No cellulitis or open wounds.     CBC Lab Results  Component Value Date   WBC 7.9 11/26/2020   HGB 14.9 11/26/2020   HCT 41.8 11/26/2020   MCV 87 11/26/2020   PLT 200 11/26/2020    BMET    Component Value Date/Time   NA 133 (L) 11/26/2020 1446   K 4.4 11/26/2020 1446   CL 95 (L) 11/26/2020 1446   CO2 23 11/26/2020 1446   GLUCOSE 81 11/26/2020 1446   BUN 21 11/26/2020 1446   CREATININE 1.44 (H) 11/26/2020 1446   CALCIUM 9.1 11/26/2020 1446   GFRNONAA 46 (L) 10/10/2019 1430   GFRAA 54 (L) 10/10/2019 1430   Estimated Creatinine Clearance: 49.1 mL/min (A) (by C-G formula based on SCr of 1.44 mg/dL (H)).  COAG No results found for: INR, PROTIME  Radiology No results found.   Assessment/Plan Essential (primary) hypertension blood pressure control important in reducing the progression of atherosclerotic disease. On appropriate oral medications.     Hyperlipidemia, unspecified lipid control important in reducing the progression of atherosclerotic disease. Continue statin therapy  Bilateral carotid artery disease (HCC) Duplex today shows a known left internal carotid artery occlusion with stable 40 to 59% right ICA stenosis.  No progression from previous studies.  No role for  intervention at this level.  Recheck in 1 year.  Continue current medical regimen    , MD  12/07/2020 3:10 PM    This note was created with Dragon medical transcription system.  Any errors from dictation are purely unintentional

## 2021-05-31 ENCOUNTER — Ambulatory Visit (INDEPENDENT_AMBULATORY_CARE_PROVIDER_SITE_OTHER): Payer: Medicare HMO | Admitting: Nurse Practitioner

## 2021-05-31 ENCOUNTER — Encounter: Payer: Self-pay | Admitting: Nurse Practitioner

## 2021-05-31 ENCOUNTER — Other Ambulatory Visit: Payer: Self-pay

## 2021-05-31 VITALS — BP 116/72 | HR 100 | Temp 98.7°F | Ht 72.8 in | Wt 162.4 lb

## 2021-05-31 DIAGNOSIS — N1831 Chronic kidney disease, stage 3a: Secondary | ICD-10-CM | POA: Diagnosis not present

## 2021-05-31 DIAGNOSIS — Z23 Encounter for immunization: Secondary | ICD-10-CM | POA: Diagnosis not present

## 2021-05-31 DIAGNOSIS — Z0000 Encounter for general adult medical examination without abnormal findings: Secondary | ICD-10-CM

## 2021-05-31 DIAGNOSIS — J432 Centrilobular emphysema: Secondary | ICD-10-CM

## 2021-05-31 DIAGNOSIS — I700 Atherosclerosis of aorta: Secondary | ICD-10-CM

## 2021-05-31 DIAGNOSIS — I6523 Occlusion and stenosis of bilateral carotid arteries: Secondary | ICD-10-CM | POA: Diagnosis not present

## 2021-05-31 DIAGNOSIS — I10 Essential (primary) hypertension: Secondary | ICD-10-CM

## 2021-05-31 DIAGNOSIS — Z1211 Encounter for screening for malignant neoplasm of colon: Secondary | ICD-10-CM

## 2021-05-31 DIAGNOSIS — E782 Mixed hyperlipidemia: Secondary | ICD-10-CM

## 2021-05-31 LAB — URINALYSIS, ROUTINE W REFLEX MICROSCOPIC
Bilirubin, UA: NEGATIVE
Glucose, UA: NEGATIVE
Ketones, UA: NEGATIVE
Leukocytes,UA: NEGATIVE
Nitrite, UA: NEGATIVE
Protein,UA: NEGATIVE
Specific Gravity, UA: 1.02 (ref 1.005–1.030)
Urobilinogen, Ur: 0.2 mg/dL (ref 0.2–1.0)
pH, UA: 6.5 (ref 5.0–7.5)

## 2021-05-31 LAB — MICROSCOPIC EXAMINATION
Bacteria, UA: NONE SEEN
Epithelial Cells (non renal): NONE SEEN /hpf (ref 0–10)
WBC, UA: NONE SEEN /hpf (ref 0–5)

## 2021-05-31 MED ORDER — AMLODIPINE BESYLATE 5 MG PO TABS: 5.0000 mg | ORAL_TABLET | Freq: Every day | ORAL | 1 refills | Status: DC

## 2021-05-31 MED ORDER — LISINOPRIL-HYDROCHLOROTHIAZIDE 20-25 MG PO TABS
1.0000 | ORAL_TABLET | Freq: Every day | ORAL | 1 refills | Status: DC
Start: 2021-05-31 — End: 2021-11-01

## 2021-05-31 MED ORDER — ATORVASTATIN CALCIUM 20 MG PO TABS: 20.0000 mg | ORAL_TABLET | Freq: Every day | ORAL | 1 refills | Status: DC

## 2021-05-31 NOTE — Assessment & Plan Note (Signed)
Chronic.  Controlled.  Continue with current medication regimen of Amlodipine 5mg, Lisinopril 20mg and HCTZ 25mg daily.  Refills sent today.  Labs ordered today.  Return to clinic in 6 months for reevaluation.  Call sooner if concerns arise.   

## 2021-05-31 NOTE — Assessment & Plan Note (Signed)
Followed by vascular and last seen 12/09/19, told to follow up in 1 year.  Recommend continue statin and ASA daily.  Continue to follow up with vascular for evaluation.

## 2021-05-31 NOTE — Assessment & Plan Note (Signed)
Noted on CT imaging 11/05/2018.  Patient has not smoked since October 2021. Recommend continued cessation of smoking and to continue statin daily + ASA for prevention.  Will continue to reassess at future visits.

## 2021-05-31 NOTE — Assessment & Plan Note (Signed)
Chronic, ongoing.  Noted on CT imaging, reviewed the diagnosis found on CT with patient again. Educated him on this diagnosis.  No current inhalers or symptoms. Does not feel like he needs them at this time.  Walks daily.  Does get SOB when he walks occasionally.  Encouraged to continue exercise.  Recommend continued cessation of smoking.

## 2021-05-31 NOTE — Assessment & Plan Note (Signed)
Chronic.  Controlled.  Continue with current medication regimen.  Labs ordered today.  Will make recommendations and adjust medications as needed based on labs.  Return to clinic in 6 months for reevaluation.  Call sooner if concerns arise.

## 2021-05-31 NOTE — Assessment & Plan Note (Signed)
Chronic.  Controlled.  Continue with current medication regimen of Atorvastatin 20mg  daily.  Refills sent today. Could increase dose.  Labs ordered today.  Return to clinic in 6 months for reevaluation.  Call sooner if concerns arise.

## 2021-05-31 NOTE — Progress Notes (Signed)
BP 116/72    Pulse 100    Temp 98.7 F (37.1 C) (Oral)    Ht 6' 0.8" (1.849 m)    Wt 162 lb 6.4 oz (73.7 kg)    SpO2 98%    BMI 21.54 kg/m    Subjective:    Patient ID: Aaron Boyd, male    DOB: 03-21-1951, 71 y.o.   MRN: [id number]  HPI: Aaron Boyd is a 71 y.o. male presenting on 05/31/2021 for comprehensive medical examination. Current medical complaints include:none  He currently lives with: Interim Problems from his last visit: no  HYPERTENSION / HYPERLIPIDEMIA Satisfied with current treatment? yes Duration of hypertension: years BP monitoring frequency: not checking BP range:  BP medication side effects: no Past BP meds: amlodipine and lisinopril-HCTZ Duration of hyperlipidemia: years Cholesterol medication side effects: no Cholesterol supplements: none Past cholesterol medications: atorvastain (lipitor) Medication compliance: excellent compliance Aspirin: yes Recent stressors: no Recurrent headaches: no Visual changes: no Palpitations: no Dyspnea: no Chest pain: no Lower extremity edema: no Dizzy/lightheaded: no  COPD COPD status: controlled Satisfied with current treatment?: yes Oxygen use: no Dyspnea frequency: occasionally on his daily walks Cough frequency: not very often Rescue inhaler frequency:   Limitation of activity: no Productive cough:  Last Spirometry:  Pneumovax: Up to Date Influenza: Up to Date  CHRONIC KIDNEY DISEASE CKD status: controlled Medications renally dose: yes Previous renal evaluation: no Pneumovax:  Up to Date Influenza Vaccine:  Up to Date   Depression Screen done today and results listed below:  Depression screen [facility] 2/9 05/31/2021 10/25/2020 04/12/2020 10/24/2019 10/10/2019  Decreased Interest 0 0 0 0 1  Down, Depressed, Hopeless 0 0 0 0 0  PHQ - 2 Score 0 0 0 0 1  Altered sleeping 0 - - - 0  Tired, decreased energy 0 - - - 2  Change in appetite 0 - - - 0  Feeling bad or failure about yourself  0 - - - 0   Trouble concentrating 0 - - - 0  Moving slowly or fidgety/restless 0 - - - 0  Suicidal thoughts 0 - - - 0  PHQ-9 Score 0 - - - 3  Difficult doing work/chores Not difficult at all - - - -    The patient does not have a history of falls. I did complete a risk assessment for falls. A plan of care for falls was documented.   Past Medical History:  Past Medical History:  Diagnosis Date   CVA (cerebral infarction) 2005   ED (erectile dysfunction)    Hematuria    Hyperlipidemia    Hypertension     Surgical History:  Past Surgical History:  Procedure Laterality Date   clavicle surgery  1970   EYE SURGERY Left    left ear trauma     varicose veins      Medications:  Current Outpatient Medications on File Prior to Visit  Medication Sig   acetaminophen (TYLENOL) 325 MG tablet Take 650 mg by mouth every 6 (six) hours as needed.   aspirin EC 81 MG tablet Take 81 mg by mouth daily. Swallow whole.   No current facility-administered medications on file prior to visit.    Allergies:  No Known Allergies  Social History:  Social History   Socioeconomic History   Marital status: Widowed    Spouse name: Not on file   Number of children: Not on file   Years of education: Not on file   Highest education  level: High school graduate  Occupational History   Occupation: retired  Tobacco Use   Smoking status: Former    Packs/day: 0.50    Years: 44.00    Pack years: 22.00    Types: Cigarettes    Quit date: 01/15/2020    Years since quitting: 1.3   Smokeless tobacco: Never   Tobacco comments:    quit 06/17/2016, restarted smoking 03/2018  Vaping Use   Vaping Use: Former   Quit date: 05/04/2017  Substance and Sexual Activity   Alcohol use: Not Currently    Alcohol/week: 0.0 standard drinks    Comment: on occasion   Drug use: No   Sexual activity: Not Currently  Other Topics Concern   Not on file  Social History Narrative   Not on file   Social Determinants of Health    Financial Resource Strain: Low Risk    Difficulty of Paying Living Expenses: Not hard at all  Food Insecurity: No Food Insecurity   Worried About Charity fundraiser in the Last Year: Never true   [place] in the Last Year: Never true  Transportation Needs: No Transportation Needs   Lack of Transportation (Medical): No   Lack of Transportation (Non-Medical): No  Physical Activity: Insufficiently Active   Days of Exercise per Week: 3 days   Minutes of Exercise per Session: 30 min  Stress: No Stress Concern Present   Feeling of Stress : Not at all  Social Connections: Not on file  Intimate Partner Violence: Not on file   Social History   Tobacco Use  Smoking Status Former   Packs/day: 0.50   Years: 44.00   Pack years: 22.00   Types: Cigarettes   Quit date: 01/15/2020   Years since quitting: 1.3  Smokeless Tobacco Never  Tobacco Comments   quit 06/17/2016, restarted smoking 03/2018   Social History   Substance and Sexual Activity  Alcohol Use Not Currently   Alcohol/week: 0.0 standard drinks   Comment: on occasion    Family History:  Family History  Problem Relation Age of Onset   Diabetes Mother    Heart disease Father    Hypertension Father    Heart disease Brother     Past medical history, surgical history, medications, allergies, family history and social history reviewed with patient today and changes made to appropriate areas of the chart.   Review of Systems  Eyes:  Negative for blurred vision and double vision.  Respiratory:  Positive for shortness of breath.   Cardiovascular:  Negative for chest pain, palpitations and leg swelling.  Neurological:  Negative for dizziness and headaches.  All other ROS negative except what is listed above and in the HPI.      Objective:    BP 116/72    Pulse 100    Temp 98.7 F (37.1 C) (Oral)    Ht 6' 0.8" (1.849 m)    Wt 162 lb 6.4 oz (73.7 kg)    SpO2 98%    BMI 21.54 kg/m   Wt Readings from Last 3  Encounters:  05/31/21 162 lb 6.4 oz (73.7 kg)  12/07/20 158 lb (71.7 kg)  11/26/20 158 lb (71.7 kg)    Physical Exam Vitals and nursing note reviewed.  Constitutional:      General: He is not in acute distress.    Appearance: Normal appearance. He is not ill-appearing, toxic-appearing or diaphoretic.  HENT:     Head: Normocephalic.     Right Ear:  Tympanic membrane, ear canal and external ear normal.     Left Ear: Tympanic membrane, ear canal and external ear normal.     Nose: Nose normal. No congestion or rhinorrhea.     Mouth/Throat:     Mouth: Mucous membranes are moist.  Eyes:     General:        Right eye: No discharge.        Left eye: No discharge.     Extraocular Movements: Extraocular movements intact.     Conjunctiva/sclera: Conjunctivae normal.     Pupils: Pupils are equal, round, and reactive to light.  Cardiovascular:     Rate and Rhythm: Normal rate and regular rhythm.     Heart sounds: No murmur heard. Pulmonary:     Effort: Pulmonary effort is normal. No respiratory distress.     Breath sounds: Normal breath sounds. No wheezing, rhonchi or rales.  Abdominal:     General: Abdomen is flat. Bowel sounds are normal. There is no distension.     Palpations: Abdomen is soft.     Tenderness: There is no abdominal tenderness. There is no guarding.  Musculoskeletal:     Cervical back: Normal range of motion and neck supple.  Skin:    General: Skin is warm and dry.     Capillary Refill: Capillary refill takes less than 2 seconds.  Neurological:     General: No focal deficit present.     Mental Status: He is alert and oriented to person, place, and time.     Cranial Nerves: No cranial nerve deficit.     Motor: No weakness.     Deep Tendon Reflexes: Reflexes normal.  Psychiatric:        Mood and Affect: Mood normal.        Behavior: Behavior normal.        Thought Content: Thought content normal.        Judgment: Judgment normal.    Results for orders placed or  performed in visit on 11/26/20  Comp Met (CMET)  Result Value Ref Range   Glucose 81 65 - 99 mg/dL   BUN 21 8 - 27 mg/dL   Creatinine, Ser 1.44 (H) 0.76 - 1.27 mg/dL   eGFR 53 (L) >59 mL/min/1.73   BUN/Creatinine Ratio 15 10 - 24   Sodium 133 (L) 134 - 144 mmol/L   Potassium 4.4 3.5 - 5.2 mmol/L   Chloride 95 (L) 96 - 106 mmol/L   CO2 23 20 - 29 mmol/L   Calcium 9.1 8.6 - 10.2 mg/dL   Total Protein 7.5 6.0 - 8.5 g/dL   Albumin 4.2 3.8 - 4.8 g/dL   Globulin, Total 3.3 1.5 - 4.5 g/dL   Albumin/Globulin Ratio 1.3 1.2 - 2.2   Bilirubin Total 0.5 0.0 - 1.2 mg/dL   Alkaline Phosphatase 73 44 - 121 IU/L   AST 21 0 - 40 IU/L   ALT 18 0 - 44 IU/L  Lipid Profile  Result Value Ref Range   Cholesterol, Total 170 100 - 199 mg/dL   Triglycerides 61 0 - 149 mg/dL   HDL 65 >39 mg/dL   VLDL Cholesterol Cal 12 5 - 40 mg/dL   LDL Chol Calc (NIH) 93 0 - 99 mg/dL   Chol/HDL Ratio 2.6 0.0 - 5.0 ratio  CBC w/Diff  Result Value Ref Range   WBC 7.9 3.4 - 10.8 x10E3/uL   RBC 4.82 4.14 - 5.80 x10E6/uL   Hemoglobin 14.9 13.0 - 17.7 g/dL  Hematocrit 41.8 37.5 - 51.0 %   MCV 87 79 - 97 fL   MCH 30.9 26.6 - 33.0 pg   MCHC 35.6 31.5 - 35.7 g/dL   RDW 12.8 11.6 - 15.4 %   Platelets 200 150 - 450 x10E3/uL   Neutrophils 54 Not Estab. %   Lymphs 34 Not Estab. %   Monocytes 7 Not Estab. %   Eos 3 Not Estab. %   Basos 1 Not Estab. %   Neutrophils Absolute 4.3 1.4 - 7.0 x10E3/uL   Lymphocytes Absolute 2.7 0.7 - 3.1 x10E3/uL   Monocytes Absolute 0.6 0.1 - 0.9 x10E3/uL   EOS (ABSOLUTE) 0.2 0.0 - 0.4 x10E3/uL   Basophils Absolute 0.1 0.0 - 0.2 x10E3/uL   Immature Granulocytes 1 Not Estab. %   Immature Grans (Abs) 0.1 0.0 - 0.1 x10E3/uL      Assessment & Plan:   Problem List Items Addressed This Visit       Cardiovascular and Mediastinum   Bilateral carotid artery disease ([place])    Followed by vascular and last seen 12/09/19, told to follow up in 1 year.  Recommend continue statin and ASA daily.   Continue to follow up with vascular for evaluation.      Relevant Medications   amLODipine (NORVASC) 5 MG tablet   atorvastatin (LIPITOR) 20 MG tablet   lisinopril-hydrochlorothiazide (ZESTORETIC) 20-25 MG tablet   Essential (primary) hypertension    Chronic.  Controlled.  Continue with current medication regimen of Amlodipine 15m, Lisinopril 281mand HCTZ 2564maily.  Refills sent today.  Labs ordered today.  Return to clinic in 6 months for reevaluation.  Call sooner if concerns arise.        Relevant Medications   amLODipine (NORVASC) 5 MG tablet   atorvastatin (LIPITOR) 20 MG tablet   lisinopril-hydrochlorothiazide (ZESTORETIC) 20-25 MG tablet   [place]  Noted on CT imaging 11/05/2018.  Patient has not smoked since October 2021. Recommend continued cessation of smoking and to continue statin daily + ASA for prevention.  Will continue to reassess at future visits.       Relevant Medications   amLODipine (NORVASC) 5 MG tablet   atorvastatin (LIPITOR) 20 MG tablet   lisinopril-hydrochlorothiazide (ZESTORETIC) 20-25 MG tablet     Respiratory   Chronic obstructive pulmonary disease (HCC)    Chronic, ongoing.  Noted on CT imaging, reviewed the diagnosis found on CT with patient again. Educated him on this diagnosis.  No current inhalers or symptoms. Does not feel like he needs them at this time.  Walks daily.  Does get SOB when he walks occasionally.  Encouraged to continue exercise.  Recommend continued cessation of smoking.        Genitourinary   Chronic kidney disease, stage 3 (HCC) (Chronic)    Chronic.  Controlled.  Continue with current medication regimen.  Labs ordered today.  Will make recommendations and adjust medications as needed based on labs.  Return to clinic in 6 months for reevaluation.  Call sooner if concerns arise.        Relevant Orders   Comprehensive metabolic panel     Other   Hyperlipidemia    Chronic.  Controlled.  Continue with  current medication regimen of Atorvastatin 27m21mily.  Refills sent today. Could increase dose.  Labs ordered today.  Return to clinic in 6 months for reevaluation.  Call sooner if concerns arise.        Relevant Medications   amLODipine (  NORVASC) 5 MG tablet   atorvastatin (LIPITOR) 20 MG tablet   lisinopril-hydrochlorothiazide (ZESTORETIC) 20-25 MG tablet   Other Relevant Orders   Lipid panel   Other Visit Diagnoses     Annual physical exam    -  Primary   Health maintenance reviewed during visit today. Labs ordered. Colonoscopy ordered. Up to date on vaccines.    Relevant Orders   TSH   PSA   CBC with Differential/Platelet   Comprehensive metabolic panel   Urinalysis, Routine w reflex microscopic   Need for influenza vaccination       Relevant Orders   Flu Vaccine QUAD High Dose(Fluad) (Completed)   Colon cancer screening       Relevant Orders   Cologuard        Discussed aspirin prophylaxis for myocardial infarction prevention and decision was made to continue ASA  LABORATORY TESTING:  Health maintenance labs ordered today as discussed above.   The natural history of prostate cancer and ongoing controversy regarding screening and potential treatment outcomes of prostate cancer has been discussed with the patient. The meaning of a false positive PSA and a false negative PSA has been discussed. He indicates understanding of the limitations of this screening test and wishes to proceed with screening PSA testing.   IMMUNIZATIONS:   - Tdap: Tetanus vaccination status reviewed: last tetanus booster within 10 years. - Influenza: Up to date - Pneumovax: Up to date - Prevnar: Up to date - COVID: Up to date - HPV: Not applicable - Shingrix vaccine:  Discussed at visit today  SCREENING: - Colonoscopy:  Cologuard ordered   Discussed with patient purpose of the colonoscopy is to detect colon cancer at curable precancerous or early stages   - AAA Screening: Not applicable   -Hearing Test: Not applicable  -Spirometry: Not applicable   PATIENT COUNSELING:    Sexuality: Discussed sexually transmitted diseases, partner selection, use of condoms, avoidance of unintended pregnancy  and contraceptive alternatives.   Advised to avoid cigarette smoking.  I discussed with the patient that most people either abstain from alcohol or drink within safe limits (<=14/week and <=4 drinks/occasion for males, <=7/weeks and <= 3 drinks/occasion for females) and that the risk for alcohol disorders and other health effects rises proportionally with the number of drinks per week and how often a drinker exceeds daily limits.  Discussed cessation/primary prevention of drug use and availability of treatment for abuse.   Diet: Encouraged to adjust caloric intake to maintain  or achieve ideal body weight, to reduce intake of dietary saturated fat and total fat, to limit sodium intake by avoiding high sodium foods and not adding table salt, and to maintain adequate dietary potassium and calcium preferably from fresh fruits, vegetables, and low-fat dairy products.    stressed the importance of regular exercise  Injury prevention: Discussed safety belts, safety helmets, smoke detector, smoking near bedding or upholstery.   Dental health: Discussed importance of regular tooth brushing, flossing, and dental visits.   Follow up plan: NEXT PREVENTATIVE PHYSICAL DUE IN 1 YEAR. Return in about 6 months (around 11/28/2021) for HTN, HLD, DM2 FU.

## 2021-06-01 LAB — CBC WITH DIFFERENTIAL/PLATELET
Basophils Absolute: 0.1 10*3/uL (ref 0.0–0.2)
Basos: 1 %
EOS (ABSOLUTE): 0.1 10*3/uL (ref 0.0–0.4)
Eos: 1 %
Hematocrit: 43.3 % (ref 37.5–51.0)
Hemoglobin: 14.8 g/dL (ref 13.0–17.7)
Immature Grans (Abs): 0 10*3/uL (ref 0.0–0.1)
Immature Granulocytes: 1 %
Lymphocytes Absolute: 2.3 10*3/uL (ref 0.7–3.1)
Lymphs: 29 %
MCH: 31.2 pg (ref 26.6–33.0)
MCHC: 34.2 g/dL (ref 31.5–35.7)
MCV: 91 fL (ref 79–97)
Monocytes Absolute: 0.7 10*3/uL (ref 0.1–0.9)
Monocytes: 8 %
Neutrophils Absolute: 4.8 10*3/uL (ref 1.4–7.0)
Neutrophils: 60 %
Platelets: 203 10*3/uL (ref 150–450)
RBC: 4.74 x10E6/uL (ref 4.14–5.80)
RDW: 13 % (ref 11.6–15.4)
WBC: 8 10*3/uL (ref 3.4–10.8)

## 2021-06-01 LAB — COMPREHENSIVE METABOLIC PANEL
ALT: 24 IU/L (ref 0–44)
AST: 21 IU/L (ref 0–40)
Albumin/Globulin Ratio: 1.4 (ref 1.2–2.2)
Albumin: 4.2 g/dL (ref 3.8–4.8)
Alkaline Phosphatase: 75 IU/L (ref 44–121)
BUN/Creatinine Ratio: 17 (ref 10–24)
BUN: 24 mg/dL (ref 8–27)
Bilirubin Total: 0.8 mg/dL (ref 0.0–1.2)
CO2: 22 mmol/L (ref 20–29)
Calcium: 9.1 mg/dL (ref 8.6–10.2)
Chloride: 97 mmol/L (ref 96–106)
Creatinine, Ser: 1.41 mg/dL — ABNORMAL HIGH (ref 0.76–1.27)
Globulin, Total: 3.1 g/dL (ref 1.5–4.5)
Glucose: 86 mg/dL (ref 70–99)
Potassium: 4.2 mmol/L (ref 3.5–5.2)
Sodium: 134 mmol/L (ref 134–144)
Total Protein: 7.3 g/dL (ref 6.0–8.5)
eGFR: 54 mL/min/{1.73_m2} — ABNORMAL LOW (ref >59)

## 2021-06-01 LAB — LIPID PANEL
Chol/HDL Ratio: 2.5 ratio (ref 0.0–5.0)
Cholesterol, Total: 164 mg/dL (ref 100–199)
HDL: 65 mg/dL (ref >39)
LDL Chol Calc (NIH): 83 mg/dL (ref 0–99)
Triglycerides: 84 mg/dL (ref 0–149)
VLDL Cholesterol Cal: 16 mg/dL (ref 5–40)

## 2021-06-01 LAB — TSH: TSH: 2.55 u[IU]/mL (ref 0.450–4.500)

## 2021-06-01 LAB — PSA: Prostate Specific Ag, Serum: 2.3 ng/mL (ref 0.0–4.0)

## 2021-06-01 NOTE — Progress Notes (Signed)
Please let patient know that his work looks good.  Kidney function remains stable.  Cholesterol looks good.  Liver and electrolytes look good.  Continue with your current medication regimen.  Follow up as discussed.  Please let me know if you have any questions.

## 2021-10-27 ENCOUNTER — Ambulatory Visit (INDEPENDENT_AMBULATORY_CARE_PROVIDER_SITE_OTHER): Payer: Medicare HMO | Admitting: *Deleted

## 2021-10-27 DIAGNOSIS — Z0000 Encounter for general adult medical examination without abnormal findings: Secondary | ICD-10-CM | POA: Diagnosis not present

## 2021-10-27 NOTE — Progress Notes (Signed)
Subjective:   Aaron Boyd is a 71 y.o. male who presents for Medicare Annual/Subsequent preventive examination.  I connected with  Barbette Hair Brodersen on 10/27/21 by a telephone enabled telemedicine application and verified that I am speaking with the correct person using two identifiers.   I discussed the limitations of evaluation and management by telemedicine. The patient expressed understanding and agreed to proceed.  Patient location: home  Provider location:  Tele-Health- home   Review of Systems           Objective:    There were no vitals filed for this visit. There is no height or weight on file to calculate BMI.     10/25/2020    1:47 PM 10/24/2019    1:53 PM 10/07/2018    1:11 PM 10/03/2017    8:19 AM 09/05/2016    1:31 PM 08/08/2016    1:14 PM  Advanced Directives  Does Patient Have a Medical Advance Directive? Yes Yes Yes Yes Yes Yes  Type of Estate agent of [place];Living will Healthcare Power of [place];Living will Living will;Healthcare Power of Attorney Living will;Healthcare Power of [employer] Power of [place];Living will   Does patient want to make changes to medical advance directive?      No - Patient declined  Copy of Healthcare Power of Attorney in Chart? No - copy requested No - copy requested No - copy requested No - copy requested      Current Medications (verified) Outpatient Encounter Medications as of 10/27/2021  Medication Sig   acetaminophen (TYLENOL) 325 MG tablet Take 650 mg by mouth every 6 (six) hours as needed.   amLODipine (NORVASC) 5 MG tablet Take 1 tablet (5 mg total) by mouth daily.   aspirin EC 81 MG tablet Take 81 mg by mouth daily. Swallow whole.   atorvastatin (LIPITOR) 20 MG tablet Take 1 tablet (20 mg total) by mouth daily.   lisinopril-hydrochlorothiazide (ZESTORETIC) 20-25 MG tablet Take 1 tablet by mouth daily.   No facility-administered encounter medications on file as of 10/27/2021.     Allergies (verified) Patient has no known allergies.   History: Past Medical History:  Diagnosis Date   CVA (cerebral infarction) 2005   ED (erectile dysfunction)    Hematuria    Hyperlipidemia    Hypertension    Past Surgical History:  Procedure Laterality Date   clavicle surgery  1970   EYE SURGERY Left    left ear trauma     varicose veins     Family History  Problem Relation Age of Onset   Diabetes Mother    Heart disease Father    Hypertension Father    Heart disease Brother    Social History   Socioeconomic History   Marital status: Widowed    Spouse name: Not on file   Number of children: Not on file   Years of education: Not on file   Highest education level: High school graduate  Occupational History   Occupation: retired  Tobacco Use   Smoking status: Former    Packs/day: 0.50    Years: 44.00    Total pack years: 22.00    Types: Cigarettes    Quit date: 01/15/2020    Years since quitting: 1.7   Smokeless tobacco: Never   Tobacco comments:    quit 06/17/2016, restarted smoking 03/2018  Vaping Use   Vaping Use: Former   Quit date: 05/04/2017  Substance and Sexual Activity   Alcohol use: Not  Currently    Alcohol/week: 0.0 standard drinks of alcohol    Comment: on occasion   Drug use: No   Sexual activity: Not Currently  Other Topics Concern   Not on file  Social History Narrative   Not on file   Social Determinants of Health   Financial Resource Strain: Low Risk  (10/25/2020)   Overall Financial Resource Strain (CARDIA)    Difficulty of Paying Living Expenses: Not hard at all  Food Insecurity: No Food Insecurity (10/25/2020)   Hunger Vital Sign    Worried About Running Out of Food in the Last Year: Never true    Ran Out of Food in the Last Year: Never true  Transportation Needs: No Transportation Needs (10/25/2020)   PRAPARE - Administrator, Civil Service (Medical): No    Lack of Transportation (Non-Medical): No  Physical  Activity: Insufficiently Active (10/25/2020)   Exercise Vital Sign    Days of Exercise per Week: 3 days    Minutes of Exercise per Session: 30 min  Stress: No Stress Concern Present (10/25/2020)   [employer] of Occupational Health - Occupational Stress Questionnaire    Feeling of Stress : Not at all  Social Connections: Moderately Isolated (10/03/2017)   Social Connection and Isolation Panel [NHANES]    Frequency of Communication with Friends and Family: More than three times a week    Frequency of Social Gatherings with Friends and Family: More than three times a week    Attends Religious Services: Never    Database administrator or Organizations: No    Attends Banker Meetings: Never    Marital Status: Widowed    Tobacco Counseling Counseling given: Not Answered Tobacco comments: quit 06/17/2016, restarted smoking 03/2018   Clinical Intake:                 Diabetic?  no         Activities of Daily Living     No data to display          Patient Care Team: Larae Grooms, NP as PCP - General  Indicate any recent Medical Services you may have received from other than [facility] providers in the past year (date may be approximate).     Assessment:   This is a routine wellness examination for Aaron Boyd.  Hearing/Vision screen No results found.  Dietary issues and exercise activities discussed:     Goals Addressed   None    Depression Screen    05/31/2021   10:04 AM 10/25/2020    1:48 PM 04/12/2020    1:34 PM 10/24/2019    1:54 PM 10/10/2019    2:49 PM 10/10/2018    1:25 PM 10/07/2018    1:10 PM  PHQ 2/9 Scores  PHQ - 2 Score 0 0 0 0 1 0 0  PHQ- 9 Score 0    3 2     Fall Risk    05/31/2021   10:04 AM 10/25/2020    1:48 PM 04/12/2020    1:34 PM 10/24/2019    1:54 PM 10/10/2019    2:49 PM  Fall Risk   Falls in the past year? 0 0 0 0 0  Number falls in past yr: 0    0  Injury with Fall? 0    0  Risk for fall due to : No Fall Risks  Medication side effect  Medication side effect   Follow up Falls evaluation completed Falls evaluation completed;Education  provided;Falls prevention discussed  Falls evaluation completed;Education provided;Falls prevention discussed     FALL RISK PREVENTION PERTAINING TO THE HOME:  Any stairs in or around the home? Yes  If so, are there any without handrails? No  Home free of loose throw rugs in walkways, pet beds, electrical cords, etc? Yes  Adequate lighting in your home to reduce risk of falls? Yes   ASSISTIVE DEVICES UTILIZED TO PREVENT FALLS:  Life alert? No  Use of a cane, walker or w/c? No  Grab bars in the bathroom? No  Shower chair or bench in shower? No  Elevated toilet seat or a handicapped toilet? No   TIMED UP AND GO:  Was the test performed? No .   Cognitive Function:        10/25/2020    1:49 PM 10/24/2019    1:55 PM 10/03/2017    8:19 AM  6CIT Screen  What Year? 0 points 0 points 0 points  What month? 0 points 0 points 0 points  What time? 0 points 0 points 0 points  Count back from 20 0 points 0 points 0 points  Months in reverse 0 points 0 points 0 points  Repeat phrase 2 points 0 points 2 points  Total Score 2 points 0 points 2 points    Immunizations Immunization History  Administered Date(s) Administered   Fluad Quad(high Dose 65+) 04/16/2019, 01/29/2020, 05/31/2021   Influenza, High Dose Seasonal PF 12/06/2016, 04/08/2018   Influenza,inj,Quad PF,6+ Mos 01/19/2015, 01/07/2016   Influenza-Unspecified 02/09/2014   Moderna Sars-Covid-2 Vaccination 11/24/2019, 12/28/2019   Pneumococcal Conjugate-13 08/08/2016, 09/01/2020   Pneumococcal Polysaccharide-23 10/03/2017   Td 10/10/2019    TDAP status: Up to date  Flu Vaccine status: Up to date  Pneumococcal vaccine status: Up to date  Covid-19 vaccine status: Information provided on how to obtain vaccines.   Qualifies for Shingles Vaccine? Yes   Zostavax completed No   Shingrix Completed?: No.     Education has been provided regarding the importance of this vaccine. Patient has been advised to call insurance company to determine out of pocket expense if they have not yet received this vaccine. Advised may also receive vaccine at local pharmacy or Health Dept. Verbalized acceptance and understanding.  Screening Tests Health Maintenance  Topic Date Due   Zoster Vaccines- Shingrix (1 of 2) Never done   COVID-19 Vaccine (3 - Moderna series) 02/22/2020   COLONOSCOPY (Pts 45-42yrs Insurance coverage will need to be confirmed)  11/26/2021 (Originally 06/01/2020)   INFLUENZA VACCINE  11/01/2021   TETANUS/TDAP  10/09/2029   Pneumonia Vaccine 55+ Years old  Completed   Hepatitis C Screening  Completed   HPV VACCINES  Aged Out    Health Maintenance  Health Maintenance Due  Topic Date Due   Zoster Vaccines- Shingrix (1 of 2) Never done   COVID-19 Vaccine (3 - Moderna series) 02/22/2020    Colonoscopy  patient has Cologuard at his home  Lung Cancer Screening: (Low Dose CT Chest recommended if Age 2-80 years, 30 pack-year currently smoking OR have quit w/in 15years.) does qualify.   Lung Cancer Screening Referr--al:   Patient will discuss at visit on   Additional Screening:  Hepatitis C Screening: does not qualify; Completed 2019 Vision Screening: Recommended annual ophthalmology exams for early detection of glaucoma and other disorders of the eye. Is the patient up to date with their annual eye exam?  Yes  Who is the provider or what is the name of the office in  which the patient attends annual eye exams? [facility] If pt is not established with a provider, would they like to be referred to a provider to establish care? No .   Dental Screening: Recommended annual dental exams for proper oral hygiene  Community Resource Referral / Chronic Care Management: CRR required this visit?  No   CCM required this visit?  No      Plan:     I have personally reviewed and noted the  following in the patient's chart:   Medical and social history Use of alcohol, tobacco or illicit drugs  Current medications and supplements including opioid prescriptions. Patient is not currently taking opioid prescriptions. Functional ability and status Nutritional status Physical activity Advanced directives List of other physicians Hospitalizations, surgeries, and ER visits in previous 12 months Vitals Screenings to include cognitive, depression, and falls Referrals and appointments  In addition, I have reviewed and discussed with patient certain preventive protocols, quality metrics, and best practice recommendations. A written personalized care plan for preventive services as well as general preventive health recommendations were provided to patient.     Remi Haggard, LPN   07/10/8117   Nurse Notes:

## 2021-10-27 NOTE — Patient Instructions (Signed)
Aaron Boyd , Thank you for taking time to come for your Medicare Wellness Visit. I appreciate your ongoing commitment to your health goals. Please review the following plan we discussed and let me know if I can assist you in the future.   Screening recommendations/referrals: Colonoscopy: has Cologuard Recommended yearly ophthalmology/optometry visit for glaucoma screening and checkup Recommended yearly dental visit for hygiene and checkup  Vaccinations: Influenza vaccine: up to date Pneumococcal vaccine: up to date Tdap vaccine: up to date Shingles vaccine: Education provided    Advanced directives: on file  Conditions/risks identified:   Next appointment: 11-28-2021   @ 2:00  [facility] 65 Years and Older, Male Preventive care refers to lifestyle choices and visits with your health care provider that can promote health and wellness. What does preventive care include? A yearly physical exam. This is also called an annual well check. Dental exams once or twice a year. Routine eye exams. Ask your health care provider how often you should have your eyes checked. Personal lifestyle choices, including: Daily care of your teeth and gums. Regular physical activity. Eating a healthy diet. Avoiding tobacco and drug use. Limiting alcohol use. Practicing safe sex. Taking low doses of aspirin every day. Taking vitamin and mineral supplements as recommended by your health care provider. What happens during an annual well check? The services and screenings done by your health care provider during your annual well check will depend on your age, overall health, lifestyle risk factors, and family history of disease. Counseling  Your health care provider may ask you questions about your: Alcohol use. Tobacco use. Drug use. Emotional well-being. Home and relationship well-being. Sexual activity. Eating habits. History of falls. Memory and ability to understand  (cognition). Work and work Astronomer. Screening  You may have the following tests or measurements: Height, weight, and BMI. Blood pressure. Lipid and cholesterol levels. These may be checked every 5 years, or more frequently if you are over 43 years old. Skin check. Lung cancer screening. You may have this screening every year starting at age 2 if you have a 30-pack-year history of smoking and currently smoke or have quit within the past 15 years. Fecal occult blood test (FOBT) of the stool. You may have this test every year starting at age 13. Flexible sigmoidoscopy or colonoscopy. You may have a sigmoidoscopy every 5 years or a colonoscopy every 10 years starting at age 72. Prostate cancer screening. Recommendations will vary depending on your family history and other risks. Hepatitis C blood test. Hepatitis B blood test. Sexually transmitted disease (STD) testing. Diabetes screening. This is done by checking your blood sugar (glucose) after you have not eaten for a while (fasting). You may have this done every 1-3 years. Abdominal aortic aneurysm (AAA) screening. You may need this if you are a current or former smoker. Osteoporosis. You may be screened starting at age 46 if you are at high risk. Talk with your health care provider about your test results, treatment options, and if necessary, the need for more tests. Vaccines  Your health care provider may recommend certain vaccines, such as: Influenza vaccine. This is recommended every year. Tetanus, diphtheria, and acellular pertussis (Tdap, Td) vaccine. You may need a Td booster every 10 years. Zoster vaccine. You may need this after age 65. Pneumococcal 13-valent conjugate (PCV13) vaccine. One dose is recommended after age 34. Pneumococcal polysaccharide (PPSV23) vaccine. One dose is recommended after age 34. Talk to your health care provider about which screenings  and vaccines you need and how often you need them. This  information is not intended to replace advice given to you by your health care provider. Make sure you discuss any questions you have with your health care provider. Document Released: 04/16/2015 Document Revised: 12/08/2015 Document Reviewed: 01/19/2015 Elsevier Interactive Patient Education  2017 [place] Prevention in the Home Falls can cause injuries. They can happen to people of all ages. There are many things you can do to make your home safe and to help prevent falls. What can I do on the outside of my home? Regularly fix the edges of walkways and driveways and fix any cracks. Remove anything that might make you trip as you walk through a door, such as a raised step or threshold. Trim any bushes or trees on the path to your home. Use bright outdoor lighting. Clear any walking paths of anything that might make someone trip, such as rocks or tools. Regularly check to see if handrails are loose or broken. Make sure that both sides of any steps have handrails. Any raised decks and porches should have guardrails on the edges. Have any leaves, snow, or ice cleared regularly. Use sand or salt on walking paths during winter. Clean up any spills in your garage right away. This includes oil or grease spills. What can I do in the bathroom? Use night lights. Install grab bars by the toilet and in the tub and shower. Do not use towel bars as grab bars. Use non-skid mats or decals in the tub or shower. If you need to sit down in the shower, use a plastic, non-slip stool. Keep the floor dry. Clean up any water that spills on the floor as soon as it happens. Remove soap buildup in the tub or shower regularly. Attach bath mats securely with double-sided non-slip rug tape. Do not have throw rugs and other things on the floor that can make you trip. What can I do in the bedroom? Use night lights. Make sure that you have a light by your bed that is easy to reach. Do not use any sheets or  blankets that are too big for your bed. They should not hang down onto the floor. Have a firm chair that has side arms. You can use this for support while you get dressed. Do not have throw rugs and other things on the floor that can make you trip. What can I do in the kitchen? Clean up any spills right away. Avoid walking on wet floors. Keep items that you use a lot in easy-to-reach places. If you need to reach something above you, use a strong step stool that has a grab bar. Keep electrical cords out of the way. Do not use floor polish or wax that makes floors slippery. If you must use wax, use non-skid floor wax. Do not have throw rugs and other things on the floor that can make you trip. What can I do with my stairs? Do not leave any items on the stairs. Make sure that there are handrails on both sides of the stairs and use them. Fix handrails that are broken or loose. Make sure that handrails are as long as the stairways. Check any carpeting to make sure that it is firmly attached to the stairs. Fix any carpet that is loose or worn. Avoid having throw rugs at the top or bottom of the stairs. If you do have throw rugs, attach them to the floor with carpet tape.  Make sure that you have a light switch at the top of the stairs and the bottom of the stairs. If you do not have them, ask someone to add them for you. What else can I do to help prevent falls? Wear shoes that: Do not have high heels. Have rubber bottoms. Are comfortable and fit you well. Are closed at the toe. Do not wear sandals. If you use a stepladder: Make sure that it is fully opened. Do not climb a closed stepladder. Make sure that both sides of the stepladder are locked into place. Ask someone to hold it for you, if possible. Clearly mark and make sure that you can see: Any grab bars or handrails. First and last steps. Where the edge of each step is. Use tools that help you move around (mobility aids) if they are  needed. These include: Canes. Walkers. Scooters. Crutches. Turn on the lights when you go into a dark area. Replace any light bulbs as soon as they burn out. Set up your furniture so you have a clear path. Avoid moving your furniture around. If any of your floors are uneven, fix them. If there are any pets around you, be aware of where they are. Review your medicines with your doctor. Some medicines can make you feel dizzy. This can increase your chance of falling. Ask your doctor what other things that you can do to help prevent falls. This information is not intended to replace advice given to you by your health care provider. Make sure you discuss any questions you have with your health care provider. Document Released: 01/14/2009 Document Revised: 08/26/2015 Document Reviewed: 04/24/2014 Elsevier Interactive Patient Education  2017 [employer].

## 2021-10-28 ENCOUNTER — Ambulatory Visit: Payer: Medicare HMO

## 2021-10-31 ENCOUNTER — Other Ambulatory Visit: Payer: Self-pay | Admitting: Nurse Practitioner

## 2021-11-01 NOTE — Telephone Encounter (Signed)
Appointment 11/28/21- patient will run out of medication that day- extended Rx  Requested Prescriptions  Pending Prescriptions Disp Refills  . amLODipine (NORVASC) 5 MG tablet [Pharmacy Med Name: AMLODIPINE BESYLATE 5 MG Tablet] 90 tablet 1    Sig: TAKE 1 TABLET EVERY DAY     Cardiovascular: Calcium Channel Blockers 2 Passed - 10/31/2021 10:27 AM      Passed - Last BP in normal range    BP Readings from Last 1 Encounters:  05/31/21 116/72         Passed - Last Heart Rate in normal range    Pulse Readings from Last 1 Encounters:  05/31/21 100         Passed - Valid encounter within last 6 months    Recent Outpatient Visits          5 months ago Annual physical exam   [facility] Jon Billings, NP   11 months ago Stage 3a chronic kidney disease ([place])   [facility] Jon Billings, NP   1 year ago Centrilobular emphysema ([place])   [place], Jolene T, NP   1 year ago Neck pain   [facility] Eulogio Bear, NP   2 years ago Bilateral carotid artery stenosis   [facility] [employer], [place]      Future Appointments            In 3 weeks Jon Billings, NP [facility]           . atorvastatin (LIPITOR) 20 MG tablet [Pharmacy Med Name: ATORVASTATIN CALCIUM 20 MG Tablet] 90 tablet 1    Sig: TAKE 1 TABLET EVERY DAY     Cardiovascular:  Antilipid - Statins Failed - 10/31/2021 10:27 AM      Failed - Lipid Panel in normal range within the last 12 months    Cholesterol, Total  Date Value Ref Range Status  05/31/2021 164 100 - 199 mg/dL Final   Cholesterol Piccolo, Waived  Date Value Ref Range Status  01/19/2015 138 <200 mg/dL Final    Comment:                            Desirable                <200                         Borderline High      200- 239                         High                     >239    LDL Chol Calc (NIH)  Date Value Ref Range Status   05/31/2021 83 0 - 99 mg/dL Final   HDL  Date Value Ref Range Status  05/31/2021 65 >39 mg/dL Final   Triglycerides  Date Value Ref Range Status  05/31/2021 84 0 - 149 mg/dL Final   Triglycerides Piccolo,Waived  Date Value Ref Range Status  01/19/2015 117 <150 mg/dL Final    Comment:                            Normal                   <  150                         Borderline High     150 - 199                         High                200 - 499                         Very High                >499          Passed - Patient is not pregnant      Passed - Valid encounter within last 12 months    Recent Outpatient Visits          5 months ago Annual physical exam   [facility] Jon Billings, NP   11 months ago Stage 3a chronic kidney disease ([place])   [place] Jon Billings, NP   1 year ago Centrilobular emphysema [facility])   [place], Jolene T, NP   1 year ago Neck pain   [facility] Eulogio Bear, NP   2 years ago Bilateral carotid artery stenosis   [facility] [employer], [place]      Future Appointments            In 3 weeks Jon Billings, NP [facility] [place]           . lisinopril-hydrochlorothiazide (ZESTORETIC) 20-25 MG tablet [Pharmacy Med Name: LISINOPRIL/HYDROCHLOROTHIAZIDE 20-25 MG Tablet] 90 tablet 1    Sig: TAKE 1 TABLET EVERY DAY     Cardiovascular:  ACEI + Diuretic Combos Failed - 10/31/2021 10:27 AM      Failed - Cr in normal range and within 180 days    Creatinine, Ser  Date Value Ref Range Status  05/31/2021 1.41 (H) 0.76 - 1.27 mg/dL Final         Passed - Na in normal range and within 180 days    Sodium  Date Value Ref Range Status  05/31/2021 134 134 - 144 mmol/L Final         Passed - K in normal range and within 180 days    Potassium  Date Value Ref Range Status  05/31/2021 4.2 3.5 - 5.2 mmol/L Final         Passed -  eGFR is 30 or above and within 180 days    GFR calc Af Amer  Date Value Ref Range Status  10/10/2019 54 (L) >59 mL/min/1.73 Final    Comment:    **[facility] currently reports eGFR in compliance with the current**   recommendations of the [employer]. [facility] will   update reporting as new guidelines are published from the NKF-ASN   Task force.    GFR calc non Af Amer  Date Value Ref Range Status  10/10/2019 46 (L) >59 mL/min/1.73 Final   eGFR  Date Value Ref Range Status  05/31/2021 54 (L) >59 mL/min/1.73 Final         Passed - Patient is not pregnant      Passed - Last BP in normal range    BP Readings from Last 1 Encounters:  05/31/21 116/72         Passed - Valid encounter  within last 6 months    Recent Outpatient Visits          5 months ago Annual physical exam   [place], NP   11 months ago Stage 3a chronic kidney disease [facility])   [facility] Jon Billings, NP   1 year ago Centrilobular emphysema [facility])   [place], Jolene T, NP   1 year ago Neck pain   [facility] Eulogio Bear, NP   2 years ago Bilateral carotid artery stenosis   [facility] [employer], [place]      Future Appointments            In 3 weeks Jon Billings, NP [facility] [place]

## 2021-11-28 ENCOUNTER — Encounter: Payer: Self-pay | Admitting: Nurse Practitioner

## 2021-11-28 ENCOUNTER — Ambulatory Visit (INDEPENDENT_AMBULATORY_CARE_PROVIDER_SITE_OTHER): Payer: Medicare HMO | Admitting: Nurse Practitioner

## 2021-11-28 VITALS — BP 133/73 | HR 99 | Temp 98.8°F | Wt 165.3 lb

## 2021-11-28 DIAGNOSIS — J432 Centrilobular emphysema: Secondary | ICD-10-CM | POA: Diagnosis not present

## 2021-11-28 DIAGNOSIS — I6523 Occlusion and stenosis of bilateral carotid arteries: Secondary | ICD-10-CM

## 2021-11-28 DIAGNOSIS — I700 Atherosclerosis of aorta: Secondary | ICD-10-CM

## 2021-11-28 DIAGNOSIS — I10 Essential (primary) hypertension: Secondary | ICD-10-CM

## 2021-11-28 DIAGNOSIS — N1831 Chronic kidney disease, stage 3a: Secondary | ICD-10-CM | POA: Diagnosis not present

## 2021-11-28 MED ORDER — ATORVASTATIN CALCIUM 20 MG PO TABS
20.0000 mg | ORAL_TABLET | Freq: Every day | ORAL | 1 refills | Status: DC
Start: 2021-11-28 — End: 2022-06-01

## 2021-11-28 MED ORDER — LISINOPRIL-HYDROCHLOROTHIAZIDE 20-25 MG PO TABS
1.0000 | ORAL_TABLET | Freq: Every day | ORAL | 1 refills | Status: DC
Start: 2021-11-28 — End: 2022-05-30

## 2021-11-28 MED ORDER — AMLODIPINE BESYLATE 5 MG PO TABS
5.0000 mg | ORAL_TABLET | Freq: Every day | ORAL | 1 refills | Status: DC
Start: 2021-11-28 — End: 2022-05-30

## 2021-11-28 NOTE — Assessment & Plan Note (Signed)
Chronic.  Controlled.  Continue with current medication regimen.  Labs ordered today.  Return to clinic in 6 months for reevaluation.  Call sooner if concerns arise.  ? ?

## 2021-11-28 NOTE — Assessment & Plan Note (Signed)
Chronic.  Controlled.  Continue with current medication regimen of Amlodipine 5mg, Lisinopril 20mg and HCTZ 25mg daily.  Refills sent today.  Labs ordered today.  Return to clinic in 6 months for reevaluation.  Call sooner if concerns arise.   

## 2021-11-28 NOTE — Progress Notes (Signed)
BP 133/73   Pulse 99   Temp 98.8 F (37.1 C) (Oral)   Wt 165 lb 4.8 oz (75 kg)   SpO2 96%   BMI 21.93 kg/m    Subjective:    Patient ID: Aaron Boyd, male    DOB: [birth year], 71 y.o.   MRN: [id number]  HPI: Aaron Boyd is a 71 y.o. male  Chief Complaint  Patient presents with   Hyperlipidemia   Hypertension   Diabetes    6 month follow up    HYPERTENSION / HYPERLIPIDEMIA Satisfied with current treatment? yes Duration of hypertension: years BP monitoring frequency: not checking BP range:  BP medication side effects: no Past BP meds: amlodipine and lisinopril-HCTZ Duration of hyperlipidemia: years Cholesterol medication side effects: no Cholesterol supplements: none Past cholesterol medications: atorvastain (lipitor) Medication compliance: excellent compliance Aspirin: yes Recent stressors: no Recurrent headaches: no Visual changes: no Palpitations: no Dyspnea: yes Chest pain: no Lower extremity edema: no Dizzy/lightheaded: yes  CHRONIC KIDNEY DISEASE CKD status: controlled Medications renally dose: yes Previous renal evaluation: no Pneumovax:  Up to Date Influenza Vaccine:  Up to Date  COPD COPD status: controlled Satisfied with current treatment?: yes Oxygen use: no Dyspnea frequency: not very often Cough frequency: no Rescue inhaler frequency:  not using Limitation of activity: yes Productive cough:  Last Spirometry:  Pneumovax: Up to Date Influenza: Up to Date  Relevant past medical, surgical, family and social history reviewed and updated as indicated. Interim medical history since our last visit reviewed. Allergies and medications reviewed and updated.  Review of Systems  Eyes:  Negative for visual disturbance.  Respiratory:  Negative for chest tightness and shortness of breath.   Cardiovascular:  Negative for chest pain, palpitations and leg swelling.  Neurological:  Negative for dizziness, light-headedness and headaches.     Per HPI unless specifically indicated above     Objective:    BP 133/73   Pulse 99   Temp 98.8 F (37.1 C) (Oral)   Wt 165 lb 4.8 oz (75 kg)   SpO2 96%   BMI 21.93 kg/m   Wt Readings from Last 3 Encounters:  11/28/21 165 lb 4.8 oz (75 kg)  05/31/21 162 lb 6.4 oz (73.7 kg)  12/07/20 158 lb (71.7 kg)    Physical Exam Vitals and nursing note reviewed.  Constitutional:      General: He is not in acute distress.    Appearance: Normal appearance. He is not ill-appearing, toxic-appearing or diaphoretic.  HENT:     Head: Normocephalic.     Right Ear: External ear normal.     Left Ear: External ear normal.     Nose: Nose normal. No congestion or rhinorrhea.     Mouth/Throat:     Mouth: Mucous membranes are moist.  Eyes:     General:        Right eye: No discharge.        Left eye: No discharge.     Extraocular Movements: Extraocular movements intact.     Conjunctiva/sclera: Conjunctivae normal.     Pupils: Pupils are equal, round, and reactive to light.  Cardiovascular:     Rate and Rhythm: Normal rate and regular rhythm.     Heart sounds: No murmur heard. Pulmonary:     Effort: Pulmonary effort is normal. No respiratory distress.     Breath sounds: Normal breath sounds. No wheezing, rhonchi or rales.  Abdominal:     General: Abdomen is flat. Bowel sounds are  normal.  Musculoskeletal:     Cervical back: Normal range of motion and neck supple.  Skin:    General: Skin is warm and dry.     Capillary Refill: Capillary refill takes less than 2 seconds.  Neurological:     General: No focal deficit present.     Mental Status: He is alert and oriented to person, place, and time.  Psychiatric:        Mood and Affect: Mood normal.        Behavior: Behavior normal.        Thought Content: Thought content normal.        Judgment: Judgment normal.     Results for orders placed or performed in visit on 05/31/21  Microscopic Examination   Urine  Result Value Ref Range    WBC, UA None seen 0 - 5 /hpf   RBC, Urine 3-10 (A) 0 - 2 /hpf   Epithelial Cells (non renal) None seen 0 - 10 /hpf   Bacteria, UA None seen None seen/Few  TSH  Result Value Ref Range   TSH 2.550 0.450 - 4.500 uIU/mL  PSA  Result Value Ref Range   Prostate Specific Ag, Serum 2.3 0.0 - 4.0 ng/mL  Lipid panel  Result Value Ref Range   Cholesterol, Total 164 100 - 199 mg/dL   Triglycerides 84 0 - 149 mg/dL   HDL 65 >39 mg/dL   VLDL Cholesterol Cal 16 5 - 40 mg/dL   LDL Chol Calc (NIH) 83 0 - 99 mg/dL   Chol/HDL Ratio 2.5 0.0 - 5.0 ratio  CBC with Differential/Platelet  Result Value Ref Range   WBC 8.0 3.4 - 10.8 x10E3/uL   RBC 4.74 4.14 - 5.80 x10E6/uL   Hemoglobin 14.8 13.0 - 17.7 g/dL   Hematocrit 43.3 37.5 - 51.0 %   MCV 91 79 - 97 fL   MCH 31.2 26.6 - 33.0 pg   MCHC 34.2 31.5 - 35.7 g/dL   RDW 13.0 11.6 - 15.4 %   Platelets 203 150 - 450 x10E3/uL   Neutrophils 60 Not Estab. %   Lymphs 29 Not Estab. %   Monocytes 8 Not Estab. %   Eos 1 Not Estab. %   Basos 1 Not Estab. %   Neutrophils Absolute 4.8 1.4 - 7.0 x10E3/uL   Lymphocytes Absolute 2.3 0.7 - 3.1 x10E3/uL   Monocytes Absolute 0.7 0.1 - 0.9 x10E3/uL   EOS (ABSOLUTE) 0.1 0.0 - 0.4 x10E3/uL   Basophils Absolute 0.1 0.0 - 0.2 x10E3/uL   Immature Granulocytes 1 Not Estab. %   Immature Grans (Abs) 0.0 0.0 - 0.1 x10E3/uL  Comprehensive metabolic panel  Result Value Ref Range   Glucose 86 70 - 99 mg/dL   BUN 24 8 - 27 mg/dL   Creatinine, Ser 1.41 (H) 0.76 - 1.27 mg/dL   eGFR 54 (L) >59 mL/min/1.73   BUN/Creatinine Ratio 17 10 - 24   Sodium 134 134 - 144 mmol/L   Potassium 4.2 3.5 - 5.2 mmol/L   Chloride 97 96 - 106 mmol/L   CO2 22 20 - 29 mmol/L   Calcium 9.1 8.6 - 10.2 mg/dL   Total Protein 7.3 6.0 - 8.5 g/dL   Albumin 4.2 3.8 - 4.8 g/dL   Globulin, Total 3.1 1.5 - 4.5 g/dL   Albumin/Globulin Ratio 1.4 1.2 - 2.2   Bilirubin Total 0.8 0.0 - 1.2 mg/dL   Alkaline Phosphatase 75 44 - 121 IU/L   AST 21 0 -  40 IU/L    ALT 24 0 - 44 IU/L  Urinalysis, Routine w reflex microscopic  Result Value Ref Range   Specific Gravity, UA 1.020 1.005 - 1.030   pH, UA 6.5 5.0 - 7.5   Color, UA Yellow Yellow   Appearance Ur Clear Clear   Leukocytes,UA Negative Negative   Protein,UA Negative Negative/Trace   Glucose, UA Negative Negative   Ketones, UA Negative Negative   RBC, UA 2+ (A) Negative   Bilirubin, UA Negative Negative   Urobilinogen, Ur 0.2 0.2 - 1.0 mg/dL   Nitrite, UA Negative Negative   Microscopic Examination See below:       Assessment & Plan:   Problem List Items Addressed This Visit       Cardiovascular and Mediastinum   Bilateral carotid artery disease ([place]) - Primary    Followed by vascular and last seen 12/2020, told to follow up in 1 year.  Recommend continue statin and ASA daily.  No change in Carotid Artery stenosis on exam last year per vascular.  Continue to follow up with vascular for evaluation.      Relevant Medications   amLODipine (NORVASC) 5 MG tablet   atorvastatin (LIPITOR) 20 MG tablet   lisinopril-hydrochlorothiazide (ZESTORETIC) 20-25 MG tablet   Other Relevant Orders   Comp Met (CMET)   CBC w/Diff   Essential (primary) hypertension    Chronic.  Controlled.  Continue with current medication regimen of Amlodipine 106m, Lisinopril 290mand HCTZ 2540maily.  Refills sent today.  Labs ordered today.  Return to clinic in 6 months for reevaluation.  Call sooner if concerns arise.        Relevant Medications   amLODipine (NORVASC) 5 MG tablet   atorvastatin (LIPITOR) 20 MG tablet   lisinopril-hydrochlorothiazide (ZESTORETIC) 20-25 MG tablet   Aortic atherosclerosis ([facility]  Noted on CT imaging 11/05/2018.  Patient has not smoked since October 2021. Recommend continued cessation of smoking and to continue statin daily + ASA for prevention.  Will continue to reassess at future visits. Follow up in 6 months.  Call sooner if concerns arise.      Relevant Medications    amLODipine (NORVASC) 5 MG tablet   atorvastatin (LIPITOR) 20 MG tablet   lisinopril-hydrochlorothiazide (ZESTORETIC) 20-25 MG tablet     Respiratory   Chronic obstructive pulmonary disease (HCC)    Chronic, ongoing.  Noted on CT imaging, reviewed the diagnosis found on CT with patient again. Educated him on this diagnosis.  No current inhalers or symptoms. Does not feel like he needs them at this time.  Walks daily.  Encouraged to continue exercise.  Recommend continued cessation of smoking.  Follow up in 6 months.  Call sooner if concerns arise.        Genitourinary   Chronic kidney disease, stage 3 (HCC) (Chronic)    Chronic.  Controlled.  Continue with current medication regimen.  Labs ordered today.  Return to clinic in 6 months for reevaluation.  Call sooner if concerns arise.        Relevant Orders   CBC w/Diff     Follow up plan: Return in about 6 months (around 05/31/2022) for Physical and Fasting labs.

## 2021-11-28 NOTE — Assessment & Plan Note (Signed)
Followed by vascular and last seen 12/2020, told to follow up in 1 year.  Recommend continue statin and ASA daily.  No change in Carotid Artery stenosis on exam last year per vascular.  Continue to follow up with vascular for evaluation.

## 2021-11-28 NOTE — Assessment & Plan Note (Signed)
Chronic, ongoing.  Noted on CT imaging, reviewed the diagnosis found on CT with patient again. Educated him on this diagnosis.  No current inhalers or symptoms. Does not feel like he needs them at this time.  Walks daily.  Encouraged to continue exercise.  Recommend continued cessation of smoking.  Follow up in 6 months.  Call sooner if concerns arise.

## 2021-11-28 NOTE — Assessment & Plan Note (Signed)
Noted on CT imaging 11/05/2018.  Patient has not smoked since October 2021. Recommend continued cessation of smoking and to continue statin daily + ASA for prevention.  Will continue to reassess at future visits. Follow up in 6 months.  Call sooner if concerns arise. 

## 2021-11-29 LAB — CBC WITH DIFFERENTIAL/PLATELET
Basophils Absolute: 0.1 10*3/uL (ref 0.0–0.2)
Basos: 1 %
EOS (ABSOLUTE): 0.2 10*3/uL (ref 0.0–0.4)
Eos: 2 %
Hematocrit: 41.8 % (ref 37.5–51.0)
Hemoglobin: 14.6 g/dL (ref 13.0–17.7)
Immature Grans (Abs): 0.1 10*3/uL (ref 0.0–0.1)
Immature Granulocytes: 1 %
Lymphocytes Absolute: 2.6 10*3/uL (ref 0.7–3.1)
Lymphs: 31 %
MCH: 31.2 pg (ref 26.6–33.0)
MCHC: 34.9 g/dL (ref 31.5–35.7)
MCV: 89 fL (ref 79–97)
Monocytes Absolute: 0.7 10*3/uL (ref 0.1–0.9)
Monocytes: 8 %
Neutrophils Absolute: 4.7 10*3/uL (ref 1.4–7.0)
Neutrophils: 57 %
Platelets: 213 10*3/uL (ref 150–450)
RBC: 4.68 x10E6/uL (ref 4.14–5.80)
RDW: 12.3 % (ref 11.6–15.4)
WBC: 8.3 10*3/uL (ref 3.4–10.8)

## 2021-11-29 LAB — COMPREHENSIVE METABOLIC PANEL
ALT: 23 IU/L (ref 0–44)
AST: 26 IU/L (ref 0–40)
Albumin/Globulin Ratio: 1.4 (ref 1.2–2.2)
Albumin: 4.1 g/dL (ref 3.9–4.9)
Alkaline Phosphatase: 74 IU/L (ref 44–121)
BUN/Creatinine Ratio: 19 (ref 10–24)
BUN: 26 mg/dL (ref 8–27)
Bilirubin Total: 0.6 mg/dL (ref 0.0–1.2)
CO2: 21 mmol/L (ref 20–29)
Calcium: 9 mg/dL (ref 8.6–10.2)
Chloride: 98 mmol/L (ref 96–106)
Creatinine, Ser: 1.4 mg/dL — ABNORMAL HIGH (ref 0.76–1.27)
Globulin, Total: 3 g/dL (ref 1.5–4.5)
Glucose: 87 mg/dL (ref 70–99)
Potassium: 4.3 mmol/L (ref 3.5–5.2)
Sodium: 134 mmol/L (ref 134–144)
Total Protein: 7.1 g/dL (ref 6.0–8.5)
eGFR: 54 mL/min/{1.73_m2} — ABNORMAL LOW (ref >59)

## 2021-11-29 NOTE — Progress Notes (Signed)
Please let patient know that his lab work looks good.  Kidney function is stable.  No concerns at this time.  Continue with current medication regimen.  Follow up as discussed.

## 2021-12-06 ENCOUNTER — Ambulatory Visit (INDEPENDENT_AMBULATORY_CARE_PROVIDER_SITE_OTHER): Payer: Medicare HMO | Admitting: Nurse Practitioner

## 2021-12-06 ENCOUNTER — Ambulatory Visit (INDEPENDENT_AMBULATORY_CARE_PROVIDER_SITE_OTHER): Payer: Medicare HMO

## 2021-12-06 ENCOUNTER — Encounter (INDEPENDENT_AMBULATORY_CARE_PROVIDER_SITE_OTHER): Payer: Self-pay | Admitting: Nurse Practitioner

## 2021-12-06 VITALS — BP 116/69 | HR 92 | Resp 16 | Wt 164.0 lb

## 2021-12-06 DIAGNOSIS — I6523 Occlusion and stenosis of bilateral carotid arteries: Secondary | ICD-10-CM

## 2021-12-06 DIAGNOSIS — E782 Mixed hyperlipidemia: Secondary | ICD-10-CM

## 2021-12-06 DIAGNOSIS — I10 Essential (primary) hypertension: Secondary | ICD-10-CM | POA: Diagnosis not present

## 2021-12-27 ENCOUNTER — Encounter (INDEPENDENT_AMBULATORY_CARE_PROVIDER_SITE_OTHER): Payer: Self-pay | Admitting: Nurse Practitioner

## 2021-12-27 NOTE — Progress Notes (Signed)
Subjective:    Patient ID: [name], male    DOB: 1950-05-09, 71 y.o.   MRN: [id number] Chief Complaint  Patient presents with   Follow-up    Ultrasound follow up    Patient returns in follow-up of his carotid disease.  He is doing well.  He denies any problems or issues since his last visit.  He has a remote history of stroke over 15 years ago, but no recent focal neurologic symptoms. Specifically, the patient denies amaurosis fugax, speech or swallowing difficulties, or arm or leg weakness or numbness. Duplex today shows a known left internal carotid artery occlusion with stable 40 to 59% right ICA stenosis.  No progression from previous studies.    Review of Systems  All other systems reviewed and are negative.      Objective:   Physical Exam Vitals reviewed.  HENT:     Head: Normocephalic.  Neck:     Vascular: No carotid bruit.  Cardiovascular:     Rate and Rhythm: Normal rate.     Pulses:          Radial pulses are 2+ on the right side and 2+ on the left side.  Pulmonary:     Effort: Pulmonary effort is normal.  Skin:    General: Skin is warm and dry.  Neurological:     Mental Status: He is alert and oriented to person, place, and time.  Psychiatric:        Mood and Affect: Mood normal.        Behavior: Behavior normal.        Thought Content: Thought content normal.        Judgment: Judgment normal.     BP 116/69 (BP Location: Left Arm)   Pulse 92   Resp 16   Wt 164 lb (74.4 kg)   BMI 21.76 kg/m   Past Medical History:  Diagnosis Date   CVA (cerebral infarction) 2005   ED (erectile dysfunction)    Hematuria    Hyperlipidemia    Hypertension     Social History   Socioeconomic History   Marital status: Widowed    Spouse name: Not on file   Number of children: Not on file   Years of education: Not on file   Highest education level: High school graduate  Occupational History   Occupation: retired  Tobacco Use   Smoking status: Former     Packs/day: 0.50    Years: 44.00    Total pack years: 22.00    Types: Cigarettes    Quit date: 01/15/2020    Years since quitting: 1.9   Smokeless tobacco: Never  Vaping Use   Vaping Use: Former   Quit date: 05/04/2017  Substance and Sexual Activity   Alcohol use: Not Currently    Alcohol/week: 0.0 standard drinks of alcohol    Comment: on occasion   Drug use: No   Sexual activity: Not Currently  Other Topics Concern   Not on file  Social History Narrative   Not on file   Social Determinants of Health   Financial Resource Strain: Low Risk  (10/27/2021)   Overall Financial Resource Strain (CARDIA)    Difficulty of Paying Living Expenses: Not hard at all  Food Insecurity: No Food Insecurity (10/27/2021)   Hunger Vital Sign    Worried About Running Out of Food in the Last Year: Never true    [place] in the Last Year: Never true  Transportation  Needs: No Transportation Needs (10/27/2021)   PRAPARE - Administrator, Civil Service (Medical): No    Lack of Transportation (Non-Medical): No  Physical Activity: Insufficiently Active (10/27/2021)   Exercise Vital Sign    Days of Exercise per Week: 4 days    Minutes of Exercise per Session: 30 min  Stress: No Stress Concern Present (10/25/2020)   [employer] of Occupational Health - Occupational Stress Questionnaire    Feeling of Stress : Not at all  Social Connections: Moderately Isolated (10/27/2021)   Social Connection and Isolation Panel [NHANES]    Frequency of Communication with Friends and Family: More than three times a week    Frequency of Social Gatherings with Friends and Family: Once a week    Attends Religious Services: Never    Database administrator or Organizations: Yes    Attends Banker Meetings: 1 to 4 times per year    Marital Status: Widowed  Intimate Partner Violence: Not At Risk (10/27/2021)   Humiliation, Afraid, Rape, and Kick questionnaire    Fear of Current or  Ex-Partner: No    Emotionally Abused: No    Physically Abused: No    Sexually Abused: No    Past Surgical History:  Procedure Laterality Date   clavicle surgery  1970   EYE SURGERY Left    left ear trauma     varicose veins      Family History  Problem Relation Age of Onset   Diabetes Mother    Heart disease Father    Hypertension Father    Heart disease Brother     No Known Allergies     Latest Ref Rng & Units 11/28/2021    1:57 PM 05/31/2021   10:23 AM 11/26/2020    2:46 PM  CBC  WBC 3.4 - 10.8 x10E3/uL 8.3  8.0  7.9   Hemoglobin 13.0 - 17.7 g/dL 03.5  46.5  68.1   Hematocrit 37.5 - 51.0 % 41.8  43.3  41.8   Platelets 150 - 450 x10E3/uL 213  203  200       CMP     Component Value Date/Time   NA 134 11/28/2021 1357   K 4.3 11/28/2021 1357   CL 98 11/28/2021 1357   CO2 21 11/28/2021 1357   GLUCOSE 87 11/28/2021 1357   BUN 26 11/28/2021 1357   CREATININE 1.40 (H) 11/28/2021 1357   CALCIUM 9.0 11/28/2021 1357   PROT 7.1 11/28/2021 1357   ALBUMIN 4.1 11/28/2021 1357   AST 26 11/28/2021 1357   ALT 23 11/28/2021 1357   ALKPHOS 74 11/28/2021 1357   BILITOT 0.6 11/28/2021 1357   GFRNONAA 46 (L) 10/10/2019 1430   GFRAA 54 (L) 10/10/2019 1430     No results found.     Assessment & Plan:   1. Bilateral carotid artery stenosis Recommend:  Given the patient's asymptomatic subcritical stenosis no further invasive testing or surgery at this time.  Duplex ultrasound shows a known left ICA occlusion with 40 to 59% stenosis of the right ICA  Continue antiplatelet therapy as prescribed Continue management of CAD, HTN and Hyperlipidemia Healthy heart diet,  encouraged exercise at least 4 times per week Follow up in 12 months with duplex ultrasound and physical exam    2. Essential (primary) hypertension Continue antihypertensive medications as already ordered, these medications have been reviewed and there are no changes at this time.   3. Mixed  hyperlipidemia Continue statin as ordered  and reviewed, no changes at this time    Current Outpatient Medications on File Prior to Visit  Medication Sig Dispense Refill   acetaminophen (TYLENOL) 325 MG tablet Take 650 mg by mouth every 6 (six) hours as needed.     amLODipine (NORVASC) 5 MG tablet Take 1 tablet (5 mg total) by mouth daily. 90 tablet 1   aspirin EC 81 MG tablet Take 81 mg by mouth daily. Swallow whole.     atorvastatin (LIPITOR) 20 MG tablet Take 1 tablet (20 mg total) by mouth daily. 90 tablet 1   lisinopril-hydrochlorothiazide (ZESTORETIC) 20-25 MG tablet Take 1 tablet by mouth daily. 90 tablet 1   No current facility-administered medications on file prior to visit.    There are no Patient Instructions on file for this visit. No follow-ups on file.   Georgiana Spinner, NP

## 2022-01-31 DIAGNOSIS — H2513 Age-related nuclear cataract, bilateral: Secondary | ICD-10-CM | POA: Diagnosis not present

## 2022-05-29 ENCOUNTER — Other Ambulatory Visit: Payer: Self-pay | Admitting: Nurse Practitioner

## 2022-05-30 NOTE — Telephone Encounter (Signed)
Requested Prescriptions  Pending Prescriptions Disp Refills   lisinopril-hydrochlorothiazide (ZESTORETIC) 20-25 MG tablet [Pharmacy Med Name: LISINOPRIL/HYDROCHLOROTHIAZIDE 20-25 MG Tablet] 90 tablet 0    Sig: TAKE 1 TABLET EVERY DAY     Cardiovascular:  ACEI + Diuretic Combos Failed - 05/29/2022  3:28 AM      Failed - Na in normal range and within 180 days    Sodium  Date Value Ref Range Status  11/28/2021 134 134 - 144 mmol/L Final         Failed - K in normal range and within 180 days    Potassium  Date Value Ref Range Status  11/28/2021 4.3 3.5 - 5.2 mmol/L Final         Failed - Cr in normal range and within 180 days    Creatinine, Ser  Date Value Ref Range Status  11/28/2021 1.40 (H) 0.76 - 1.27 mg/dL Final         Failed - eGFR is 30 or above and within 180 days    GFR calc Af Amer  Date Value Ref Range Status  10/10/2019 54 (L) >59 mL/min/1.73 Final    Comment:    **[facility] currently reports eGFR in compliance with the current**   recommendations of the [employer]. [facility] will   update reporting as new guidelines are published from the NKF-ASN   Task force.    GFR calc non Af Amer  Date Value Ref Range Status  10/10/2019 46 (L) >59 mL/min/1.73 Final   eGFR  Date Value Ref Range Status  11/28/2021 54 (L) >59 mL/min/1.73 Final         Failed - Valid encounter within last 6 months    Recent Outpatient Visits           6 months ago Bilateral carotid artery stenosis   [place] Jon Billings, NP   12 months ago Annual physical exam   [place] Jon Billings, NP   1 year ago Stage 3a chronic kidney disease [facility])   [place] Jon Billings, NP   2 years ago Centrilobular emphysema [facility])   [place], Henrine Screws T, NP   2 years ago Neck pain   [place], Jessica A, NP       Future  Appointments             In 2 days Jon Billings, NP [place], [place] - Patient is not pregnant      Passed - Last BP in normal range    BP Readings from Last 1 Encounters:  12/06/21 116/69          amLODipine (NORVASC) 5 MG tablet [Pharmacy Med Name: AMLODIPINE BESYLATE 5 MG Tablet] 90 tablet 0    Sig: TAKE 1 TABLET EVERY DAY     Cardiovascular: Calcium Channel Blockers 2 Failed - 05/29/2022  3:28 AM      Failed - Valid encounter within last 6 months    Recent Outpatient Visits           6 months ago Bilateral carotid artery stenosis   [place], NP   12 months ago Annual physical exam   [place] Jon Billings, NP   1 year ago Stage 3a chronic kidney disease ([place])   [place] Jon Billings, NP   2 years ago Centrilobular emphysema [facility])   [place], Henrine Screws T, NP   2 years ago Neck pain   [place] Bear, NP       Future Appointments             In 2 days Jon Billings, NP [place]            Passed - Last BP in normal range    BP Readings from Last 1 Encounters:  12/06/21 116/69         Passed - Last Heart Rate in normal range    Pulse Readings from Last 1 Encounters:  12/06/21 92

## 2022-05-31 NOTE — Progress Notes (Unsigned)
There were no vitals taken for this visit.   Subjective:    Patient ID: Aaron Boyd, male    DOB: 28-Mar-1951, 72 y.o.   MRN: [id number]  HPI: Aaron Boyd is a 72 y.o. male presenting on 06/01/2022 for comprehensive medical examination. Current medical complaints include:none  He currently lives with: Interim Problems from his last visit: no  HYPERTENSION / HYPERLIPIDEMIA Satisfied with current treatment? yes Duration of hypertension: years BP monitoring frequency: not checking BP range:  BP medication side effects: no Past BP meds: amlodipine and lisinopril-HCTZ Duration of hyperlipidemia: years Cholesterol medication side effects: no Cholesterol supplements: none Past cholesterol medications: atorvastain (lipitor) Medication compliance: excellent compliance Aspirin: yes Recent stressors: no Recurrent headaches: no Visual changes: no Palpitations: no Dyspnea: no Chest pain: no Lower extremity edema: no Dizzy/lightheaded: no  COPD COPD status: controlled Satisfied with current treatment?: yes Oxygen use: no Dyspnea frequency: occasionally on his daily walks Cough frequency: not very often Rescue inhaler frequency:   Limitation of activity: no Productive cough:  Last Spirometry:  Pneumovax: Up to Date Influenza: Up to Date  CHRONIC KIDNEY DISEASE CKD status: controlled Medications renally dose: yes Previous renal evaluation: no Pneumovax:  Up to Date Influenza Vaccine:  Up to Date   Depression Screen done today and results listed below:     11/28/2021    1:47 PM 10/27/2021    1:09 PM 05/31/2021   10:04 AM 10/25/2020    1:48 PM 04/12/2020    1:34 PM  Depression screen PHQ 2/9  Decreased Interest 0 0 0 0 0  Down, Depressed, Hopeless 0 0 0 0 0  PHQ - 2 Score 0 0 0 0 0  Altered sleeping 0  0    Tired, decreased energy 1  0    Change in appetite 0  0    Feeling bad or failure about yourself  0  0    Trouble concentrating 0  0    Moving slowly or  fidgety/restless 0  0    Suicidal thoughts 0  0    PHQ-9 Score 1  0    Difficult doing work/chores Not difficult at all  Not difficult at all      The patient does not have a history of falls. I did complete a risk assessment for falls. A plan of care for falls was documented.   Past Medical History:  Past Medical History:  Diagnosis Date  . CVA (cerebral infarction) 2005  . ED (erectile dysfunction)   . Hematuria   . Hyperlipidemia   . Hypertension     Surgical History:  Past Surgical History:  Procedure Laterality Date  . clavicle surgery  1970  . EYE SURGERY Left   . left ear trauma    . varicose veins      Medications:  Current Outpatient Medications on File Prior to Visit  Medication Sig  . acetaminophen (TYLENOL) 325 MG tablet Take 650 mg by mouth every 6 (six) hours as needed.  Marland Kitchen amLODipine (NORVASC) 5 MG tablet TAKE 1 TABLET EVERY DAY  . aspirin EC 81 MG tablet Take 81 mg by mouth daily. Swallow whole.  Marland Kitchen atorvastatin (LIPITOR) 20 MG tablet Take 1 tablet (20 mg total) by mouth daily.  Marland Kitchen lisinopril-hydrochlorothiazide (ZESTORETIC) 20-25 MG tablet TAKE 1 TABLET EVERY DAY   No current facility-administered medications on file prior to visit.    Allergies:  No Known Allergies  Social History:  Social History   Socioeconomic History  .  Marital status: Widowed    Spouse name: Not on file  . Number of children: Not on file  . Years of education: Not on file  . Highest education level: High school graduate  Occupational History  . Occupation: retired  Tobacco Use  . Smoking status: Former    Packs/day: 0.50    Years: 44.00    Total pack years: 22.00    Types: Cigarettes    Quit date: 01/15/2020    Years since quitting: 2.3  . Smokeless tobacco: Never  Vaping Use  . Vaping Use: Former  . Quit date: 05/04/2017  Substance and Sexual Activity  . Alcohol use: Not Currently    Alcohol/week: 0.0 standard drinks of alcohol    Comment: on occasion  . Drug  use: No  . Sexual activity: Not Currently  Other Topics Concern  . Not on file  Social History Narrative  . Not on file   Social Determinants of Health   Financial Resource Strain: Low Risk  (10/27/2021)   Overall Financial Resource Strain (CARDIA)   . Difficulty of Paying Living Expenses: Not hard at all  Food Insecurity: No Food Insecurity (10/27/2021)   Hunger Vital Sign   . Worried About Charity fundraiser in the Last Year: Never true   . Ran Out of Food in the Last Year: Never true  Transportation Needs: No Transportation Needs (10/27/2021)   PRAPARE - Transportation   . Lack of Transportation (Medical): No   . Lack of Transportation (Non-Medical): No  Physical Activity: Insufficiently Active (10/27/2021)   Exercise Vital Sign   . Days of Exercise per Week: 4 days   . Minutes of Exercise per Session: 30 min  Stress: No Stress Concern Present (10/25/2020)   Central   . Feeling of Stress : Not at all  Social Connections: Moderately Isolated (10/27/2021)   Social Connection and Isolation Panel [NHANES]   . Frequency of Communication with Friends and Family: More than three times a week   . Frequency of Social Gatherings with Friends and Family: Once a week   . Attends Religious Services: Never   . Active Member of Clubs or Organizations: Yes   . Attends Archivist Meetings: 1 to 4 times per year   . Marital Status: Widowed  Intimate Partner Violence: Not At Risk (10/27/2021)   Humiliation, Afraid, Rape, and Kick questionnaire   . Fear of Current or Ex-Partner: No   . Emotionally Abused: No   . Physically Abused: No   . Sexually Abused: No   Social History   Tobacco Use  Smoking Status Former  . Packs/day: 0.50  . Years: 44.00  . Total pack years: 22.00  . Types: Cigarettes  . Quit date: 01/15/2020  . Years since quitting: 2.3  Smokeless Tobacco Never   Social History   Substance and  Sexual Activity  Alcohol Use Not Currently  . Alcohol/week: 0.0 standard drinks of alcohol   Comment: on occasion    Family History:  Family History  Problem Relation Age of Onset  . Diabetes Mother   . Heart disease Father   . Hypertension Father   . Heart disease Brother     Past medical history, surgical history, medications, allergies, family history and social history reviewed with patient today and changes made to appropriate areas of the chart.   Review of Systems  Eyes:  Negative for blurred vision and double vision.  Respiratory:  Positive for shortness of breath.   Cardiovascular:  Negative for chest pain, palpitations and leg swelling.  Neurological:  Negative for dizziness and headaches.  All other ROS negative except what is listed above and in the HPI.      Objective:    There were no vitals taken for this visit.  Wt Readings from Last 3 Encounters:  12/06/21 164 lb (74.4 kg)  11/28/21 165 lb 4.8 oz (75 kg)  05/31/21 162 lb 6.4 oz (73.7 kg)    Physical Exam Vitals and nursing note reviewed.  Constitutional:      General: He is not in acute distress.    Appearance: Normal appearance. He is not ill-appearing, toxic-appearing or diaphoretic.  HENT:     Head: Normocephalic.     Right Ear: Tympanic membrane, ear canal and external ear normal.     Left Ear: Tympanic membrane, ear canal and external ear normal.     Nose: Nose normal. No congestion or rhinorrhea.     Mouth/Throat:     Mouth: Mucous membranes are moist.  Eyes:     General:        Right eye: No discharge.        Left eye: No discharge.     Extraocular Movements: Extraocular movements intact.     Conjunctiva/sclera: Conjunctivae normal.     Pupils: Pupils are equal, round, and reactive to light.  Cardiovascular:     Rate and Rhythm: Normal rate and regular rhythm.     Heart sounds: No murmur heard. Pulmonary:     Effort: Pulmonary effort is normal. No respiratory distress.     Breath  sounds: Normal breath sounds. No wheezing, rhonchi or rales.  Abdominal:     General: Abdomen is flat. Bowel sounds are normal. There is no distension.     Palpations: Abdomen is soft.     Tenderness: There is no abdominal tenderness. There is no guarding.  Musculoskeletal:     Cervical back: Normal range of motion and neck supple.  Skin:    General: Skin is warm and dry.     Capillary Refill: Capillary refill takes less than 2 seconds.  Neurological:     General: No focal deficit present.     Mental Status: He is alert and oriented to person, place, and time.     Cranial Nerves: No cranial nerve deficit.     Motor: No weakness.     Deep Tendon Reflexes: Reflexes normal.  Psychiatric:        Mood and Affect: Mood normal.        Behavior: Behavior normal.        Thought Content: Thought content normal.        Judgment: Judgment normal.    Results for orders placed or performed in visit on 11/28/21  Comp Met (CMET)  Result Value Ref Range   Glucose 87 70 - 99 mg/dL   BUN 26 8 - 27 mg/dL   Creatinine, Ser 1.40 (H) 0.76 - 1.27 mg/dL   eGFR 54 (L) >59 mL/min/1.73   BUN/Creatinine Ratio 19 10 - 24   Sodium 134 134 - 144 mmol/L   Potassium 4.3 3.5 - 5.2 mmol/L   Chloride 98 96 - 106 mmol/L   CO2 21 20 - 29 mmol/L   Calcium 9.0 8.6 - 10.2 mg/dL   Total Protein 7.1 6.0 - 8.5 g/dL   Albumin 4.1 3.9 - 4.9 g/dL   Globulin, Total 3.0 1.5 - 4.5 g/dL  Albumin/Globulin Ratio 1.4 1.2 - 2.2   Bilirubin Total 0.6 0.0 - 1.2 mg/dL   Alkaline Phosphatase 74 44 - 121 IU/L   AST 26 0 - 40 IU/L   ALT 23 0 - 44 IU/L  CBC w/Diff  Result Value Ref Range   WBC 8.3 3.4 - 10.8 x10E3/uL   RBC 4.68 4.14 - 5.80 x10E6/uL   Hemoglobin 14.6 13.0 - 17.7 g/dL   Hematocrit 41.8 37.5 - 51.0 %   MCV 89 79 - 97 fL   MCH 31.2 26.6 - 33.0 pg   MCHC 34.9 31.5 - 35.7 g/dL   RDW 12.3 11.6 - 15.4 %   Platelets 213 150 - 450 x10E3/uL   Neutrophils 57 Not Estab. %   Lymphs 31 Not Estab. %   Monocytes 8 Not  Estab. %   Eos 2 Not Estab. %   Basos 1 Not Estab. %   Neutrophils Absolute 4.7 1.4 - 7.0 x10E3/uL   Lymphocytes Absolute 2.6 0.7 - 3.1 x10E3/uL   Monocytes Absolute 0.7 0.1 - 0.9 x10E3/uL   EOS (ABSOLUTE) 0.2 0.0 - 0.4 x10E3/uL   Basophils Absolute 0.1 0.0 - 0.2 x10E3/uL   Immature Granulocytes 1 Not Estab. %   Immature Grans (Abs) 0.1 0.0 - 0.1 x10E3/uL      Assessment & Plan:   Problem List Items Addressed This Visit      Cardiovascular and Mediastinum   Bilateral carotid artery disease (HCC)   Essential (primary) hypertension   Aortic atherosclerosis (HCC) - Primary     Respiratory   Chronic obstructive pulmonary disease (HCC)     Genitourinary   Chronic kidney disease, stage 3 (HCC) (Chronic)     Other   Hyperlipidemia     Discussed aspirin prophylaxis for myocardial infarction prevention and decision was made to continue ASA  LABORATORY TESTING:  Health maintenance labs ordered today as discussed above.   The natural history of prostate cancer and ongoing controversy regarding screening and potential treatment outcomes of prostate cancer has been discussed with the patient. The meaning of a false positive PSA and a false negative PSA has been discussed. He indicates understanding of the limitations of this screening test and wishes to proceed with screening PSA testing.   IMMUNIZATIONS:   - Tdap: Tetanus vaccination status reviewed: last tetanus booster within 10 years. - Influenza: Up to date - Pneumovax: Up to date - Prevnar: Up to date - COVID: Up to date - HPV: Not applicable - Shingrix vaccine:  Discussed at visit today  SCREENING: - Colonoscopy:  Cologuard ordered   Discussed with patient purpose of the colonoscopy is to detect colon cancer at curable precancerous or early stages   - AAA Screening: Not applicable  -Hearing Test: Not applicable  -Spirometry: Not applicable   PATIENT COUNSELING:    Sexuality: Discussed sexually transmitted diseases,  partner selection, use of condoms, avoidance of unintended pregnancy  and contraceptive alternatives.   Advised to avoid cigarette smoking.  I discussed with the patient that most people either abstain from alcohol or drink within safe limits (<=14/week and <=4 drinks/occasion for males, <=7/weeks and <= 3 drinks/occasion for females) and that the risk for alcohol disorders and other health effects rises proportionally with the number of drinks per week and how often a drinker exceeds daily limits.  Discussed cessation/primary prevention of drug use and availability of treatment for abuse.   Diet: Encouraged to adjust caloric intake to maintain  or achieve ideal body weight, to reduce  intake of dietary saturated fat and total fat, to limit sodium intake by avoiding high sodium foods and not adding table salt, and to maintain adequate dietary potassium and calcium preferably from fresh fruits, vegetables, and low-fat dairy products.    stressed the importance of regular exercise  Injury prevention: Discussed safety belts, safety helmets, smoke detector, smoking near bedding or upholstery.   Dental health: Discussed importance of regular tooth brushing, flossing, and dental visits.   Follow up plan: NEXT PREVENTATIVE PHYSICAL DUE IN 1 YEAR. No follow-ups on file.

## 2022-06-01 ENCOUNTER — Encounter: Payer: Self-pay | Admitting: Nurse Practitioner

## 2022-06-01 ENCOUNTER — Ambulatory Visit (INDEPENDENT_AMBULATORY_CARE_PROVIDER_SITE_OTHER): Payer: Medicare HMO | Admitting: Nurse Practitioner

## 2022-06-01 VITALS — BP 137/64 | HR 90 | Temp 98.5°F | Ht 72.5 in | Wt 166.8 lb

## 2022-06-01 DIAGNOSIS — N1831 Chronic kidney disease, stage 3a: Secondary | ICD-10-CM | POA: Diagnosis not present

## 2022-06-01 DIAGNOSIS — Z1211 Encounter for screening for malignant neoplasm of colon: Secondary | ICD-10-CM

## 2022-06-01 DIAGNOSIS — Z0000 Encounter for general adult medical examination without abnormal findings: Secondary | ICD-10-CM

## 2022-06-01 DIAGNOSIS — I6523 Occlusion and stenosis of bilateral carotid arteries: Secondary | ICD-10-CM | POA: Diagnosis not present

## 2022-06-01 DIAGNOSIS — E782 Mixed hyperlipidemia: Secondary | ICD-10-CM

## 2022-06-01 DIAGNOSIS — I10 Essential (primary) hypertension: Secondary | ICD-10-CM

## 2022-06-01 DIAGNOSIS — I700 Atherosclerosis of aorta: Secondary | ICD-10-CM | POA: Diagnosis not present

## 2022-06-01 DIAGNOSIS — J432 Centrilobular emphysema: Secondary | ICD-10-CM | POA: Diagnosis not present

## 2022-06-01 LAB — URINALYSIS, ROUTINE W REFLEX MICROSCOPIC
Bilirubin, UA: NEGATIVE
Glucose, UA: NEGATIVE
Ketones, UA: NEGATIVE
Leukocytes,UA: NEGATIVE
Nitrite, UA: NEGATIVE
Protein,UA: NEGATIVE
Specific Gravity, UA: 1.01 (ref 1.005–1.030)
Urobilinogen, Ur: 0.2 mg/dL (ref 0.2–1.0)
pH, UA: 7 (ref 5.0–7.5)

## 2022-06-01 LAB — MICROSCOPIC EXAMINATION
Bacteria, UA: NONE SEEN
Epithelial Cells (non renal): NONE SEEN /hpf (ref 0–10)
WBC, UA: NONE SEEN /hpf (ref 0–5)

## 2022-06-01 MED ORDER — AMLODIPINE BESYLATE 5 MG PO TABS: 5.0000 mg | ORAL_TABLET | Freq: Every day | ORAL | 1 refills | Status: DC

## 2022-06-01 MED ORDER — ATORVASTATIN CALCIUM 20 MG PO TABS: 20.0000 mg | ORAL_TABLET | Freq: Every day | ORAL | 1 refills | Status: DC

## 2022-06-01 MED ORDER — LISINOPRIL-HYDROCHLOROTHIAZIDE 20-25 MG PO TABS: 1.0000 | ORAL_TABLET | Freq: Every day | ORAL | 1 refills | Status: DC

## 2022-06-01 NOTE — Assessment & Plan Note (Signed)
Followed by vascular and last seen 12/2021, told to follow up in 1 year.  Recommend continue statin and ASA daily.  No change in Carotid Artery stenosis on exam last year per vascular.  Continue to follow up with vascular for evaluation.

## 2022-06-01 NOTE — Assessment & Plan Note (Signed)
Chronic.  Controlled.  Continue with current medication regimen of Amlodipine '5mg'$ , Lisinopril '20mg'$  and HCTZ '25mg'$  daily.  Refills sent today.  Labs ordered today.  Return to clinic in 6 months for reevaluation.  Call sooner if concerns arise.

## 2022-06-01 NOTE — Assessment & Plan Note (Addendum)
Chronic, ongoing.  Noted on CT imaging, reviewed the diagnosis found on CT with patient again. Educated him on this diagnosis.  No current inhalers or symptoms. Does not feel like he needs them at this time.  Walks daily.  Encouraged to continue exercise.  CT Lung ordered.  Recommend continued cessation of smoking.  Follow up in 6 months.  Call sooner if concerns arise.

## 2022-06-01 NOTE — Assessment & Plan Note (Signed)
Chronic.  Controlled.  Continue with current medication regimen.  Labs ordered today.  Return to clinic in 6 months for reevaluation.  Call sooner if concerns arise.  ? ?

## 2022-06-01 NOTE — Assessment & Plan Note (Signed)
Chronic.  Controlled.  Continue with current medication regimen of Atorvastatin '20mg'$  daily.  Refills sent today. Needs to increase dose.  Labs ordered today.  Return to clinic in 6 months for reevaluation.  Call sooner if concerns arise.

## 2022-06-01 NOTE — Assessment & Plan Note (Signed)
Noted on CT imaging 11/05/2018.  Patient has not smoked since October 2021. Recommend continued cessation of smoking and to continue statin daily + ASA for prevention.  Will continue to reassess at future visits. Follow up in 6 months.  Call sooner if concerns arise.

## 2022-06-02 LAB — CBC WITH DIFFERENTIAL/PLATELET
Basophils Absolute: 0 10*3/uL (ref 0.0–0.2)
Basos: 1 %
EOS (ABSOLUTE): 0.1 10*3/uL (ref 0.0–0.4)
Eos: 2 %
Hematocrit: 42.8 % (ref 37.5–51.0)
Hemoglobin: 14.4 g/dL (ref 13.0–17.7)
Immature Grans (Abs): 0 10*3/uL (ref 0.0–0.1)
Immature Granulocytes: 1 %
Lymphocytes Absolute: 2.3 10*3/uL (ref 0.7–3.1)
Lymphs: 33 %
MCH: 30.8 pg (ref 26.6–33.0)
MCHC: 33.6 g/dL (ref 31.5–35.7)
MCV: 92 fL (ref 79–97)
Monocytes Absolute: 0.6 10*3/uL (ref 0.1–0.9)
Monocytes: 8 %
Neutrophils Absolute: 3.8 10*3/uL (ref 1.4–7.0)
Neutrophils: 55 %
Platelets: 201 10*3/uL (ref 150–450)
RBC: 4.67 x10E6/uL (ref 4.14–5.80)
RDW: 13 % (ref 11.6–15.4)
WBC: 6.8 10*3/uL (ref 3.4–10.8)

## 2022-06-02 LAB — COMPREHENSIVE METABOLIC PANEL
ALT: 26 IU/L (ref 0–44)
AST: 23 IU/L (ref 0–40)
Albumin/Globulin Ratio: 1.4 (ref 1.2–2.2)
Albumin: 4.2 g/dL (ref 3.9–4.9)
Alkaline Phosphatase: 76 IU/L (ref 44–121)
BUN/Creatinine Ratio: 13 (ref 10–24)
BUN: 20 mg/dL (ref 8–27)
Bilirubin Total: 0.8 mg/dL (ref 0.0–1.2)
CO2: 21 mmol/L (ref 20–29)
Calcium: 8.8 mg/dL (ref 8.6–10.2)
Chloride: 97 mmol/L (ref 96–106)
Creatinine, Ser: 1.54 mg/dL — ABNORMAL HIGH (ref 0.76–1.27)
Globulin, Total: 2.9 g/dL (ref 1.5–4.5)
Glucose: 89 mg/dL (ref 70–99)
Potassium: 4.5 mmol/L (ref 3.5–5.2)
Sodium: 134 mmol/L (ref 134–144)
Total Protein: 7.1 g/dL (ref 6.0–8.5)
eGFR: 48 mL/min/{1.73_m2} — ABNORMAL LOW (ref >59)

## 2022-06-02 LAB — TSH: TSH: 2.79 u[IU]/mL (ref 0.450–4.500)

## 2022-06-02 LAB — LIPID PANEL
Chol/HDL Ratio: 2.3 ratio (ref 0.0–5.0)
Cholesterol, Total: 142 mg/dL (ref 100–199)
HDL: 62 mg/dL (ref >39)
LDL Chol Calc (NIH): 65 mg/dL (ref 0–99)
Triglycerides: 77 mg/dL (ref 0–149)
VLDL Cholesterol Cal: 15 mg/dL (ref 5–40)

## 2022-06-02 LAB — PSA: Prostate Specific Ag, Serum: 3 ng/mL (ref 0.0–4.0)

## 2022-06-02 NOTE — Progress Notes (Signed)
Please let patient know that overall his lab work looks good.  His kidney function remains stable.  His cholesterol is in the normal range.  Prostate cancer screening is normal.  No concerns at this time.  Follow up as discussed.

## 2022-06-23 DIAGNOSIS — Z1211 Encounter for screening for malignant neoplasm of colon: Secondary | ICD-10-CM | POA: Diagnosis not present

## 2022-07-01 LAB — COLOGUARD: COLOGUARD: NEGATIVE

## 2022-07-02 NOTE — Progress Notes (Signed)
Please let Aaron Boyd know his Cologuard is negative and can repeat in 3 years.

## 2022-10-11 ENCOUNTER — Other Ambulatory Visit: Payer: Self-pay | Admitting: Emergency Medicine

## 2022-10-11 DIAGNOSIS — Z122 Encounter for screening for malignant neoplasm of respiratory organs: Secondary | ICD-10-CM

## 2022-10-11 DIAGNOSIS — Z87891 Personal history of nicotine dependence: Secondary | ICD-10-CM

## 2022-10-26 ENCOUNTER — Telehealth: Payer: Self-pay | Admitting: Nurse Practitioner

## 2022-10-26 NOTE — Telephone Encounter (Signed)
Copied from CRM [id number]. Topic: Medicare AWV >> Oct 26, 2022  2:08 PM Payton Doughty wrote: Reason for CRM: Called 10/25/2022 to sched AWV - NO VOICEMAIL  Verlee Rossetti; Care Guide Ambulatory Clinical Support [place] [facility] Direct Dial: [phone]

## 2022-11-06 ENCOUNTER — Ambulatory Visit: Payer: Medicare HMO | Attending: Acute Care

## 2022-11-24 ENCOUNTER — Other Ambulatory Visit (INDEPENDENT_AMBULATORY_CARE_PROVIDER_SITE_OTHER): Payer: Self-pay | Admitting: Nurse Practitioner

## 2022-11-24 DIAGNOSIS — I6523 Occlusion and stenosis of bilateral carotid arteries: Secondary | ICD-10-CM

## 2022-12-05 ENCOUNTER — Ambulatory Visit (INDEPENDENT_AMBULATORY_CARE_PROVIDER_SITE_OTHER): Payer: Medicare HMO | Admitting: Vascular Surgery

## 2022-12-05 ENCOUNTER — Encounter (INDEPENDENT_AMBULATORY_CARE_PROVIDER_SITE_OTHER): Payer: Self-pay | Admitting: Vascular Surgery

## 2022-12-05 ENCOUNTER — Ambulatory Visit (INDEPENDENT_AMBULATORY_CARE_PROVIDER_SITE_OTHER): Payer: Medicare HMO

## 2022-12-05 VITALS — BP 121/70 | HR 79 | Resp 18 | Ht 72.0 in | Wt 162.4 lb

## 2022-12-05 DIAGNOSIS — I10 Essential (primary) hypertension: Secondary | ICD-10-CM

## 2022-12-05 DIAGNOSIS — I6523 Occlusion and stenosis of bilateral carotid arteries: Secondary | ICD-10-CM

## 2022-12-05 DIAGNOSIS — E782 Mixed hyperlipidemia: Secondary | ICD-10-CM

## 2022-12-05 NOTE — Progress Notes (Signed)
MRN : [id number]  Aaron Boyd is a 72 y.o. (27-Dec-1950) male who presents with chief complaint of  Chief Complaint  Patient presents with   Follow-up    3yr. carotid  .  History of Present Illness: Patient returns in follow-up of his carotid disease.  He is doing well without any focal neurologic symptoms. Specifically, the patient denies amaurosis fugax, speech or swallowing difficulties, or arm or leg weakness or numbness. Carotid duplex shows stable 1-39% right ICA stenosis with a known left carotid occlusion.    Current Outpatient Medications  Medication Sig Dispense Refill   acetaminophen (TYLENOL) 325 MG tablet Take 650 mg by mouth every 6 (six) hours as needed.     amLODipine (NORVASC) 5 MG tablet Take 1 tablet (5 mg total) by mouth daily. 90 tablet 1   aspirin EC 81 MG tablet Take 81 mg by mouth daily. Swallow whole.     atorvastatin (LIPITOR) 20 MG tablet Take 1 tablet (20 mg total) by mouth daily. 90 tablet 1   lisinopril-hydrochlorothiazide (ZESTORETIC) 20-25 MG tablet Take 1 tablet by mouth daily. 90 tablet 1   No current facility-administered medications for this visit.    Past Medical History:  Diagnosis Date   CVA (cerebral infarction) 2005   ED (erectile dysfunction)    Hematuria    Hyperlipidemia    Hypertension     Past Surgical History:  Procedure Laterality Date   clavicle surgery  1970   EYE SURGERY Left    left ear trauma     varicose veins       Social History   Tobacco Use   Smoking status: Former    Current packs/day: 0.00    Average packs/day: 0.5 packs/day for 44.0 years (22.0 ttl pk-yrs)    Types: Cigarettes    Start date: 01/15/1976    Quit date: 01/15/2020    Years since quitting: 2.8   Smokeless tobacco: Never  Vaping Use   Vaping status: Former   Quit date: 05/04/2017  Substance Use Topics   Alcohol use: Not Currently    Alcohol/week: 0.0 standard drinks of alcohol    Comment: on occasion   Drug use: No      Family  History  Problem Relation Age of Onset   Diabetes Mother    Heart disease Father    Hypertension Father    Heart disease Brother      No Known Allergies  REVIEW OF SYSTEMS (Negative unless checked)   Constitutional: [] Weight loss  [] Fever  [] Chills Cardiac: [] Chest pain   [] Chest pressure   [] Palpitations   [] Shortness of breath when laying flat   [] Shortness of breath at rest   [] Shortness of breath with exertion. Vascular:  [] Pain in legs with walking   [] Pain in legs at rest   [] Pain in legs when laying flat   [] Claudication   [] Pain in feet when walking  [] Pain in feet at rest  [] Pain in feet when laying flat   [] History of DVT   [] Phlebitis   [] Swelling in legs   [] Varicose veins   [] Non-healing ulcers Pulmonary:   [] Uses home oxygen   [] Productive cough   [] Hemoptysis   [] Wheeze  [] COPD   [] Asthma Neurologic:  [x] Dizziness  [] Blackouts   [] Seizures   [x] History of stroke   [x] History of TIA  [] Aphasia   [] Temporary blindness   [] Dysphagia   [] Weakness or numbness in arms   [] Weakness or numbness in legs Musculoskeletal:  [] Arthritis   []   Joint swelling   [] Joint pain   [] Low back pain Hematologic:  [] Easy bruising  [] Easy bleeding   [] Hypercoagulable state   [] Anemic  [] Hepatitis Gastrointestinal:  [] Blood in stool   [] Vomiting blood  [] Gastroesophageal reflux/heartburn   [] Difficulty swallowing. Genitourinary:  [] Chronic kidney disease   [] Difficult urination  [] Frequent urination  [] Burning with urination   [x] Blood in urine Skin:  [] Rashes   [] Ulcers   [] Wounds Psychological:  [] History of anxiety   []  History of major depression.  Physical Examination  Vitals:   12/05/22 1338  BP: 121/70  Pulse: 79  Resp: 18  Weight: 162 lb 6.4 oz (73.7 kg)  Height: 6' (1.829 m)   Body mass index is 22.03 kg/m. Gen:  WD/WN, NAD Head: [place]/AT, No temporalis wasting. Ear/Nose/Throat: Hearing grossly intact, nares w/o erythema or drainage, trachea midline Eyes: Conjunctiva clear. Sclera  non-icteric Neck: Supple.  no bruit  Pulmonary:  Good air movement, equal and clear to auscultation bilaterally.  Cardiac: RRR, No JVD Vascular:  Vessel Right Left  Radial Palpable Palpable           Musculoskeletal: M/S 5/5 throughout.  No deformity or atrophy. No edema. Neurologic: CN 2-12 intact. Sensation grossly intact in extremities.  Symmetrical.  Speech is fluent. Motor exam as listed above. Psychiatric: Judgment intact, Mood & affect appropriate for pt's clinical situation. Dermatologic: No rashes or ulcers noted.  No cellulitis or open wounds.     CBC Lab Results  Component Value Date   WBC 6.8 06/01/2022   HGB 14.4 06/01/2022   HCT 42.8 06/01/2022   MCV 92 06/01/2022   PLT 201 06/01/2022    BMET    Component Value Date/Time   NA 134 06/01/2022 0919   K 4.5 06/01/2022 0919   CL 97 06/01/2022 0919   CO2 21 06/01/2022 0919   GLUCOSE 89 06/01/2022 0919   BUN 20 06/01/2022 0919   CREATININE 1.54 (H) 06/01/2022 0919   CALCIUM 8.8 06/01/2022 0919   GFRNONAA 46 (L) 10/10/2019 1430   GFRAA 54 (L) 10/10/2019 1430   CrCl cannot be calculated (Patient's most recent lab result is older than the maximum 21 days allowed.).  COAG No results found for: "INR", "PROTIME"  Radiology No results found.   Assessment/Plan Bilateral carotid artery disease (HCC) Carotid duplex shows stable 1-39% right ICA stenosis with a known left carotid occlusion.  Continue aspirin and statin agent.  Continue to follow on an annual basis.  No role for intervention at this time.  Essential (primary) hypertension blood pressure control important in reducing the progression of atherosclerotic disease. On appropriate oral medications.     Hyperlipidemia, unspecified lipid control important in reducing the progression of atherosclerotic disease. Continue statin therapy  Festus Barren, MD  12/05/2022 2:14 PM    This note was created with Dragon medical transcription system.  Any errors  from dictation are purely unintentional

## 2022-12-05 NOTE — Assessment & Plan Note (Addendum)
Carotid duplex shows stable 1-39% right ICA stenosis with a known left carotid occlusion.  Continue aspirin and statin agent.  Continue to follow on an annual basis.  No role for intervention at this time.

## 2022-12-30 ENCOUNTER — Other Ambulatory Visit: Payer: Self-pay | Admitting: Nurse Practitioner

## 2023-01-01 ENCOUNTER — Ambulatory Visit: Payer: Medicare HMO | Admitting: Nurse Practitioner

## 2023-01-01 NOTE — Telephone Encounter (Signed)
Requested Prescriptions  Pending Prescriptions Disp Refills   amLODipine (NORVASC) 5 MG tablet [Pharmacy Med Name: amLODIPine Besylate Oral Tablet 5 MG] 90 tablet 0    Sig: TAKE 1 TABLET EVERY DAY     Cardiovascular: Calcium Channel Blockers 2 Failed - 12/30/2022  2:45 AM      Failed - Valid encounter within last 6 months    Recent Outpatient Visits           7 months ago Aortic atherosclerosis (HCC)   [place] [facility] Larae Grooms, NP   1 year ago Bilateral carotid artery stenosis   [place] [facility] Larae Grooms, NP   1 year ago Annual physical exam   [place] [facility] Larae Grooms, NP   2 years ago Stage 3a chronic kidney disease (HCC)   [facility] Larae Grooms, NP   2 years ago Centrilobular emphysema (HCC)   [facility], Corrie Dandy T, NP       Future Appointments             In 2 weeks Larae Grooms, NP [place] [facility]            Passed - Last BP in normal range    BP Readings from Last 1 Encounters:  12/05/22 121/70         Passed - Last Heart Rate in normal range    Pulse Readings from Last 1 Encounters:  12/05/22 79          lisinopril-hydrochlorothiazide (ZESTORETIC) 20-25 MG tablet [Pharmacy Med Name: Lisinopril-hydroCHLOROthiazide Oral Tablet 20-25 MG] 90 tablet 0    Sig: TAKE 1 TABLET EVERY DAY     Cardiovascular:  ACEI + Diuretic Combos Failed - 12/30/2022  2:45 AM      Failed - Na in normal range and within 180 days    Sodium  Date Value Ref Range Status  06/01/2022 134 134 - 144 mmol/L Final         Failed - K in normal range and within 180 days    Potassium  Date Value Ref Range Status  06/01/2022 4.5 3.5 - 5.2 mmol/L Final         Failed - Cr in normal range and within 180 days    Creatinine, Ser  Date Value Ref Range Status  06/01/2022 1.54 (H) 0.76 - 1.27 mg/dL Final          Failed - eGFR is 30 or above and within 180 days    GFR calc Af Amer  Date Value Ref Range Status  10/10/2019 54 (L) >59 mL/min/1.73 Final    Comment:    **[facility] currently reports eGFR in compliance with the current**   recommendations of the [employer]. [facility] will   update reporting as new guidelines are published from the NKF-ASN   Task force.    GFR calc non Af Amer  Date Value Ref Range Status  10/10/2019 46 (L) >59 mL/min/1.73 Final   eGFR  Date Value Ref Range Status  06/01/2022 48 (L) >59 mL/min/1.73 Final         Failed - Valid encounter within last 6 months    Recent Outpatient Visits           7 months ago Aortic atherosclerosis [facility])   [facility] Larae Grooms, NP   1 year ago Bilateral carotid artery stenosis    [facility] Larae G[facility], NP   1 year ago Annual physical exam   [place] [facility] Larae Grooms, NP   2 years ago Stage 3a chronic kidney disease [facility])   [facility] Larae Grooms, NP   2 years ago Centrilobular emphysema (HCC)   [place] [facility] [place], Corrie Dandy T, NP       Future Appointments             In 2 weeks Larae Grooms, NP [place] [facility]            Passed - Patient is not pregnant      Passed - Last BP in normal range    BP Readings from Last 1 Encounters:  12/05/22 121/70          atorvastatin (LIPITOR) 20 MG tablet [Pharmacy Med Name: Atorvastatin Calcium Oral Tablet 20 MG] 90 tablet 0    Sig: TAKE 1 TABLET EVERY DAY     Cardiovascular:  Antilipid - Statins Failed - 12/30/2022  2:45 AM      Failed - Lipid Panel in normal range within the last 12 months    Cholesterol, Total  Date Value Ref Range Status  06/01/2022 142 100 - 199 mg/dL Final   Cholesterol Piccolo, Waived  Date Value Ref Range Status  01/19/2015 138 <200 mg/dL Final     Comment:                            Desirable                <200                         Borderline High      200- 239                         High                     >239    LDL Chol Calc (NIH)  Date Value Ref Range Status  06/01/2022 65 0 - 99 mg/dL Final   HDL  Date Value Ref Range Status  06/01/2022 62 >39 mg/dL Final   Triglycerides  Date Value Ref Range Status  06/01/2022 77 0 - 149 mg/dL Final   Triglycerides Piccolo,Waived  Date Value Ref Range Status  01/19/2015 117 <150 mg/dL Final    Comment:                            Normal                   <150                         Borderline High     150 - 199                         High                200 - 499                         Very High                >  499          Passed - Patient is not pregnant      Passed - Valid encounter within last 12 months    Recent Outpatient Visits           7 months ago Aortic atherosclerosis (HCC)   [facility] Larae Grooms, NP   1 year ago Bilateral carotid artery stenosis   [place] [place] [facility] Larae Grooms, NP   1 year ago Annual physical exam   [place] [facility] Larae Grooms, NP   2 years ago Stage 3a chronic kidney disease [facility])   [place] [facility] Larae Grooms, NP   2 years ago Centrilobular emphysema [facility])   [facility] Marjie Skiff, NP       Future Appointments             In 2 weeks Larae Grooms, NP [place] [facility]

## 2023-01-17 ENCOUNTER — Encounter: Payer: Self-pay | Admitting: Nurse Practitioner

## 2023-01-17 ENCOUNTER — Ambulatory Visit: Payer: Medicare HMO | Admitting: Nurse Practitioner

## 2023-01-17 VITALS — BP 119/70 | HR 86 | Temp 98.4°F | Ht 72.0 in | Wt 162.8 lb

## 2023-01-17 DIAGNOSIS — E782 Mixed hyperlipidemia: Secondary | ICD-10-CM | POA: Diagnosis not present

## 2023-01-17 DIAGNOSIS — I6523 Occlusion and stenosis of bilateral carotid arteries: Secondary | ICD-10-CM | POA: Diagnosis not present

## 2023-01-17 DIAGNOSIS — E785 Hyperlipidemia, unspecified: Secondary | ICD-10-CM | POA: Diagnosis not present

## 2023-01-17 DIAGNOSIS — N1831 Chronic kidney disease, stage 3a: Secondary | ICD-10-CM

## 2023-01-17 DIAGNOSIS — I10 Essential (primary) hypertension: Secondary | ICD-10-CM

## 2023-01-17 DIAGNOSIS — Z7189 Other specified counseling: Secondary | ICD-10-CM

## 2023-01-17 DIAGNOSIS — I700 Atherosclerosis of aorta: Secondary | ICD-10-CM | POA: Diagnosis not present

## 2023-01-17 DIAGNOSIS — Z0000 Encounter for general adult medical examination without abnormal findings: Secondary | ICD-10-CM | POA: Diagnosis not present

## 2023-01-17 DIAGNOSIS — J432 Centrilobular emphysema: Secondary | ICD-10-CM

## 2023-01-17 DIAGNOSIS — Z23 Encounter for immunization: Secondary | ICD-10-CM | POA: Diagnosis not present

## 2023-01-17 DIAGNOSIS — Z87891 Personal history of nicotine dependence: Secondary | ICD-10-CM

## 2023-01-17 DIAGNOSIS — N183 Chronic kidney disease, stage 3 unspecified: Secondary | ICD-10-CM | POA: Diagnosis not present

## 2023-01-17 LAB — URINALYSIS, ROUTINE W REFLEX MICROSCOPIC
Bilirubin, UA: NEGATIVE
Glucose, UA: NEGATIVE
Ketones, UA: NEGATIVE
Leukocytes,UA: NEGATIVE
Nitrite, UA: NEGATIVE
Protein,UA: NEGATIVE
Specific Gravity, UA: 1.01 (ref 1.005–1.030)
Urobilinogen, Ur: 0.2 mg/dL (ref 0.2–1.0)
pH, UA: 6 (ref 5.0–7.5)

## 2023-01-17 LAB — MICROSCOPIC EXAMINATION
Bacteria, UA: NONE SEEN
WBC, UA: NONE SEEN /[HPF] (ref 0–5)

## 2023-01-17 MED ORDER — AMLODIPINE BESYLATE 5 MG PO TABS: 5.0000 mg | ORAL_TABLET | Freq: Every day | ORAL | 1 refills | Status: DC

## 2023-01-17 MED ORDER — LISINOPRIL-HYDROCHLOROTHIAZIDE 20-25 MG PO TABS: 1.0000 | ORAL_TABLET | Freq: Every day | ORAL | 1 refills | Status: DC

## 2023-01-17 MED ORDER — ATORVASTATIN CALCIUM 20 MG PO TABS: 20.0000 mg | ORAL_TABLET | Freq: Every day | ORAL | 1 refills | Status: DC

## 2023-01-17 NOTE — Progress Notes (Signed)
BP 119/70   Pulse 86   Temp 98.4 F (36.9 C) (Oral)   Ht 6' (1.829 m)   Wt 162 lb 12.8 oz (73.8 kg)   SpO2 96%   BMI 22.08 kg/m    Subjective:    Patient ID: Aaron Boyd, male    DOB: 06-Feb-1951, 72 y.o.   MRN: [id number]  HPI: KENETH ETCHELLS is a 72 y.o. male presenting on 01/17/2023 for comprehensive medical examination. Current medical complaints include:none  He currently lives with: Interim Problems from his last visit: no  HYPERTENSION / HYPERLIPIDEMIA Satisfied with current treatment? yes Duration of hypertension: years BP monitoring frequency: not checking BP range:  BP medication side effects: no Past BP meds: amlodipine and lisinopril-HCTZ Duration of hyperlipidemia: years Cholesterol medication side effects: no Cholesterol supplements: none Past cholesterol medications: atorvastain (lipitor) Medication compliance: excellent compliance Aspirin: yes Recent stressors: no Recurrent headaches: no Visual changes: no Palpitations: no Dyspnea: no Chest pain: no Lower extremity edema: no Dizzy/lightheaded: no  COPD COPD status: controlled Satisfied with current treatment?: yes Oxygen use: no Dyspnea frequency: occasionally on his daily walks Cough frequency: not very often Rescue inhaler frequency:   Limitation of activity: no Productive cough:  Last Spirometry:  Pneumovax: Up to Date Influenza: Up to Date  CHRONIC KIDNEY DISEASE CKD status: controlled Medications renally dose: yes Previous renal evaluation: no Pneumovax:  Up to Date Influenza Vaccine:  Up to Date    Functional Status Survey: Is the patient deaf or have difficulty hearing?: No Does the patient have difficulty seeing, even when wearing glasses/contacts?: Yes Does the patient have difficulty concentrating, remembering, or making decisions?: No Does the patient have difficulty walking or climbing stairs?: No Does the patient have difficulty dressing or bathing?: No Does  the patient have difficulty doing errands alone such as visiting a doctor's office or shopping?: No  FALL RISK:    01/17/2023    2:29 PM 06/01/2022    8:59 AM 11/28/2021    1:47 PM 10/27/2021    1:04 PM 05/31/2021   10:04 AM  Fall Risk   Falls in the past year? 0 0 0 1 0  Number falls in past yr: 0 0 0 0 0  Injury with Fall? 0 0 0 0 0  Risk for fall due to : No Fall Risks No Fall Risks No Fall Risks  No Fall Risks  Follow up Falls evaluation completed Falls evaluation completed Falls evaluation completed Falls evaluation completed;Education provided;Falls prevention discussed Falls evaluation completed    Depression Screen    01/17/2023    2:29 PM 06/01/2022    9:00 AM 11/28/2021    1:47 PM 10/27/2021    1:09 PM 05/31/2021   10:04 AM  Depression screen PHQ 2/9  Decreased Interest 0 0 0 0 0  Down, Depressed, Hopeless 0 0 0 0 0  PHQ - 2 Score 0 0 0 0 0  Altered sleeping 0 0 0  0  Tired, decreased energy 0 0 1  0  Change in appetite 0 0 0  0  Feeling bad or failure about yourself  0 0 0  0  Trouble concentrating 0 0 0  0  Moving slowly or fidgety/restless 0 0 0  0  Suicidal thoughts 0 0 0  0  PHQ-9 Score 0 0 1  0  Difficult doing work/chores Not difficult at all Not difficult at all Not difficult at all  Not difficult at all    Advanced Directives  Does patient have a HCPOA?    yes If yes, name and contact information: his son Does patient have a living will or MOST form?  yes  Past Medical History:  Past Medical History:  Diagnosis Date   CVA (cerebral infarction) 2005   ED (erectile dysfunction)    Hematuria    Hyperlipidemia    Hypertension     Surgical History:  Past Surgical History:  Procedure Laterality Date   clavicle surgery  1970   EYE SURGERY Left    left ear trauma     varicose veins      Medications:  Current Outpatient Medications on File Prior to Visit  Medication Sig   acetaminophen (TYLENOL) 325 MG tablet Take 650 mg by mouth every 6 (six)  hours as needed.   aspirin EC 81 MG tablet Take 81 mg by mouth daily. Swallow whole.   No current facility-administered medications on file prior to visit.    Allergies:  No Known Allergies  Social History:  Social History   Socioeconomic History   Marital status: Widowed    Spouse name: Not on file   Number of children: Not on file   Years of education: Not on file   Highest education level: High school graduate  Occupational History   Occupation: retired  Tobacco Use   Smoking status: Former    Current packs/day: 0.00    Average packs/day: 0.5 packs/day for 44.0 years (22.0 ttl pk-yrs)    Types: Cigarettes    Start date: 01/15/1976    Quit date: 01/15/2020    Years since quitting: 3.0   Smokeless tobacco: Never  Vaping Use   Vaping status: Former   Quit date: 05/04/2017  Substance and Sexual Activity   Alcohol use: Yes    Comment: on occasion   Drug use: No   Sexual activity: Not Currently  Other Topics Concern   Not on file  Social History Narrative   Not on file   Social Determinants of Health   Financial Resource Strain: Low Risk  (01/17/2023)   Overall Financial Resource Strain (CARDIA)    Difficulty of Paying Living Expenses: Not hard at all  Food Insecurity: No Food Insecurity (01/17/2023)   Hunger Vital Sign    Worried About Running Out of Food in the Last Year: Never true    Ran Out of Food in the Last Year: Never true  Transportation Needs: No Transportation Needs (01/17/2023)   PRAPARE - Administrator, Civil Service (Medical): No    Lack of Transportation (Non-Medical): No  Physical Activity: Insufficiently Active (01/17/2023)   Exercise Vital Sign    Days of Exercise per Week: 7 days    Minutes of Exercise per Session: 10 min  Stress: No Stress Concern Present (01/17/2023)   [employer] of Occupational Health - Occupational Stress Questionnaire    Feeling of Stress : Not at all  Social Connections: Socially Isolated  (01/17/2023)   Social Connection and Isolation Panel [NHANES]    Frequency of Communication with Friends and Family: More than three times a week    Frequency of Social Gatherings with Friends and Family: Once a week    Attends Religious Services: Never    Database administrator or Organizations: No    Attends Banker Meetings: Never    Marital Status: Widowed  Intimate Partner Violence: Not At Risk (01/17/2023)   Humiliation, Afraid, Rape, and Kick questionnaire    Fear of Current or Ex-Partner:  No    Emotionally Abused: No    Physically Abused: No    Sexually Abused: No   Social History   Tobacco Use  Smoking Status Former   Current packs/day: 0.00   Average packs/day: 0.5 packs/day for 44.0 years (22.0 ttl pk-yrs)   Types: Cigarettes   Start date: 01/15/1976   Quit date: 01/15/2020   Years since quitting: 3.0  Smokeless Tobacco Never   Social History   Substance and Sexual Activity  Alcohol Use Yes   Comment: on occasion    Family History:  Family History  Problem Relation Age of Onset   Diabetes Mother    Heart disease Father    Hypertension Father    Heart disease Brother     Past medical history, surgical history, medications, allergies, family history and social history reviewed with patient today and changes made to appropriate areas of the chart.   Review of Systems  Eyes:  Negative for blurred vision and double vision.  Respiratory:  Negative for shortness of breath.   Cardiovascular:  Negative for chest pain, palpitations and leg swelling.  Neurological:  Negative for dizziness and headaches.   All other ROS negative except what is listed above and in the HPI.      Objective:    BP 119/70   Pulse 86   Temp 98.4 F (36.9 C) (Oral)   Ht 6' (1.829 m)   Wt 162 lb 12.8 oz (73.8 kg)   SpO2 96%   BMI 22.08 kg/m   Wt Readings from Last 3 Encounters:  01/17/23 162 lb 12.8 oz (73.8 kg)  12/05/22 162 lb 6.4 oz (73.7 kg)  06/01/22 166 lb  12.8 oz (75.7 kg)    No results found.  Physical Exam Vitals and nursing note reviewed.  Constitutional:      General: He is not in acute distress.    Appearance: Normal appearance. He is not ill-appearing, toxic-appearing or diaphoretic.  HENT:     Head: Normocephalic.     Right Ear: Tympanic membrane, ear canal and external ear normal.     Left Ear: Tympanic membrane, ear canal and external ear normal.     Nose: Nose normal. No congestion or rhinorrhea.     Mouth/Throat:     Mouth: Mucous membranes are moist.  Eyes:     General:        Right eye: No discharge.        Left eye: No discharge.     Extraocular Movements: Extraocular movements intact.     Conjunctiva/sclera: Conjunctivae normal.     Pupils: Pupils are equal, round, and reactive to light.  Cardiovascular:     Rate and Rhythm: Normal rate and regular rhythm.     Heart sounds: No murmur heard. Pulmonary:     Effort: Pulmonary effort is normal. No respiratory distress.     Breath sounds: Normal breath sounds. No wheezing, rhonchi or rales.  Abdominal:     General: Abdomen is flat. Bowel sounds are normal. There is no distension.     Palpations: Abdomen is soft.     Tenderness: There is no abdominal tenderness. There is no guarding.  Musculoskeletal:     Cervical back: Normal range of motion and neck supple.  Skin:    General: Skin is warm and dry.     Capillary Refill: Capillary refill takes less than 2 seconds.  Neurological:     General: No focal deficit present.     Mental Status: He  is alert and oriented to person, place, and time.     Cranial Nerves: No cranial nerve deficit.     Motor: No weakness.     Deep Tendon Reflexes: Reflexes normal.  Psychiatric:        Mood and Affect: Mood normal.        Behavior: Behavior normal.        Thought Content: Thought content normal.        Judgment: Judgment normal.        01/17/2023    2:29 PM 10/27/2021    1:05 PM 10/25/2020    1:49 PM 10/24/2019    1:55 PM  10/03/2017    8:19 AM  6CIT Screen  What Year? 0 points 0 points 0 points 0 points 0 points  What month? 0 points 0 points 0 points 0 points 0 points  What time? 0 points 0 points 0 points 0 points 0 points  Count back from 20 0 points 0 points 0 points 0 points 0 points  Months in reverse 0 points 0 points 0 points 0 points 0 points  Repeat phrase 0 points 0 points 2 points 0 points 2 points  Total Score 0 points 0 points 2 points 0 points 2 points     Results for orders placed or performed in visit on 06/01/22  Microscopic Examination   Urine  Result Value Ref Range   WBC, UA None seen 0 - 5 /hpf   RBC, Urine 0-2 0 - 2 /hpf   Epithelial Cells (non renal) None seen 0 - 10 /hpf   Bacteria, UA None seen None seen/Few  TSH  Result Value Ref Range   TSH 2.790 0.450 - 4.500 uIU/mL  PSA  Result Value Ref Range   Prostate Specific Ag, Serum 3.0 0.0 - 4.0 ng/mL  Lipid panel  Result Value Ref Range   Cholesterol, Total 142 100 - 199 mg/dL   Triglycerides 77 0 - 149 mg/dL   HDL 62 >41 mg/dL   VLDL Cholesterol Cal 15 5 - 40 mg/dL   LDL Chol Calc (NIH) 65 0 - 99 mg/dL   Chol/HDL Ratio 2.3 0.0 - 5.0 ratio  CBC with Differential/Platelet  Result Value Ref Range   WBC 6.8 3.4 - 10.8 x10E3/uL   RBC 4.67 4.14 - 5.80 x10E6/uL   Hemoglobin 14.4 13.0 - 17.7 g/dL   Hematocrit 32.4 40.1 - 51.0 %   MCV 92 79 - 97 fL   MCH 30.8 26.6 - 33.0 pg   MCHC 33.6 31.5 - 35.7 g/dL   RDW 02.7 25.3 - 66.4 %   Platelets 201 150 - 450 x10E3/uL   Neutrophils 55 Not Estab. %   Lymphs 33 Not Estab. %   Monocytes 8 Not Estab. %   Eos 2 Not Estab. %   Basos 1 Not Estab. %   Neutrophils Absolute 3.8 1.4 - 7.0 x10E3/uL   Lymphocytes Absolute 2.3 0.7 - 3.1 x10E3/uL   Monocytes Absolute 0.6 0.1 - 0.9 x10E3/uL   EOS (ABSOLUTE) 0.1 0.0 - 0.4 x10E3/uL   Basophils Absolute 0.0 0.0 - 0.2 x10E3/uL   Immature Granulocytes 1 Not Estab. %   Immature Grans (Abs) 0.0 0.0 - 0.1 x10E3/uL  Comprehensive metabolic panel   Result Value Ref Range   Glucose 89 70 - 99 mg/dL   BUN 20 8 - 27 mg/dL   Creatinine, Ser 4.03 (H) 0.76 - 1.27 mg/dL   eGFR 48 (L) >47 QQ/VZD/6.38   BUN/Creatinine Ratio  13 10 - 24   Sodium 134 134 - 144 mmol/L   Potassium 4.5 3.5 - 5.2 mmol/L   Chloride 97 96 - 106 mmol/L   CO2 21 20 - 29 mmol/L   Calcium 8.8 8.6 - 10.2 mg/dL   Total Protein 7.1 6.0 - 8.5 g/dL   Albumin 4.2 3.9 - 4.9 g/dL   Globulin, Total 2.9 1.5 - 4.5 g/dL   Albumin/Globulin Ratio 1.4 1.2 - 2.2   Bilirubin Total 0.8 0.0 - 1.2 mg/dL   Alkaline Phosphatase 76 44 - 121 IU/L   AST 23 0 - 40 IU/L   ALT 26 0 - 44 IU/L  Urinalysis, Routine w reflex microscopic  Result Value Ref Range   Specific Gravity, UA 1.010 1.005 - 1.030   pH, UA 7.0 5.0 - 7.5   Color, UA Yellow Yellow   Appearance Ur Clear Clear   Leukocytes,UA Negative Negative   Protein,UA Negative Negative/Trace   Glucose, UA Negative Negative   Ketones, UA Negative Negative   RBC, UA 2+ (A) Negative   Bilirubin, UA Negative Negative   Urobilinogen, Ur 0.2 0.2 - 1.0 mg/dL   Nitrite, UA Negative Negative   Microscopic Examination See below:   Cologuard  Result Value Ref Range   COLOGUARD Negative Negative      Assessment & Plan:   Problem List Items Addressed This Visit       Cardiovascular and Mediastinum   Bilateral carotid artery disease (HCC)    Carotid duplex shows stable 1-39% right ICA stenosis with a known left carotid occlusion.  Continue aspirin and statin agent.  Continue to follow on an annual basis.  No role for intervention at this time.       Relevant Medications   lisinopril-hydrochlorothiazide (ZESTORETIC) 20-25 MG tablet   atorvastatin (LIPITOR) 20 MG tablet   amLODipine (NORVASC) 5 MG tablet   Essential (primary) hypertension    Chronic.  Controlled.  Continue with current medication regimen of Amlodipine 5mg , Lisinopril 20mg  and HCTZ 25mg  daily.  Refills sent today.  Labs ordered today.  Return to clinic in 6 months for  reevaluation.  Call sooner if concerns arise.        Relevant Medications   lisinopril-hydrochlorothiazide (ZESTORETIC) 20-25 MG tablet   atorvastatin (LIPITOR) 20 MG tablet   amLODipine (NORVASC) 5 MG tablet   Aortic atherosclerosis (HCC)    Noted on CT imaging 11/05/2018.  Patient has not smoked since October 2021. Recommend continued cessation of smoking and to continue statin daily + ASA for prevention.  Discussed importance of keeping up with statin to prevent worsening stenosis.  Will continue to reassess at future visits. Follow up in 6 months.  Call sooner if concerns arise.      Relevant Medications   lisinopril-hydrochlorothiazide (ZESTORETIC) 20-25 MG tablet   atorvastatin (LIPITOR) 20 MG tablet   amLODipine (NORVASC) 5 MG tablet     Respiratory   Chronic obstructive pulmonary disease (HCC)    Chronic, ongoing.  Noted on CT imaging, reviewed the diagnosis found on CT with patient again. Educated him on this diagnosis.  No current inhalers or symptoms. Does not feel like he needs them at this time.  Walks daily.  Encouraged to continue exercise.  CT Lung ordered.  Has not had CT Lung since 2020.  Recommend continued cessation of smoking.  Follow up in 6 months.  Call sooner if concerns arise.        Genitourinary   Chronic kidney disease, stage 3 (  HCC) (Chronic)    Chronic.  Controlled.  Continue with current medication regimen.  Labs ordered today.  Return to clinic in 6 months for reevaluation.  Call sooner if concerns arise.          Other   Hyperlipidemia    Chronic.  Controlled.  Continue with current medication regimen of Atorvastatin 20mg  daily.  Refills sent today. Labs ordered today.  Return to clinic in 6 months for reevaluation.  Call sooner if concerns arise.        Relevant Medications   lisinopril-hydrochlorothiazide (ZESTORETIC) 20-25 MG tablet   atorvastatin (LIPITOR) 20 MG tablet   amLODipine (NORVASC) 5 MG tablet   Other Relevant Orders   Lipid panel    Advanced care planning/counseling discussion    A voluntary discussion about advance care planning including the explanation and discussion of advance directives was extensively discussed  with the patient for 5 minutes with patient.  Explanation about the health care proxy and Living will was reviewed and packet with forms with explanation of how to fill them out was given.  During this discussion, the patient was able to identify a health care proxy as his son and does not want to make any changes at this time.        Former smoker   Relevant Orders   Ambulatory Referral Lung Cancer Screening [facility]   Other Visit Diagnoses     Encounter for Medicare annual wellness exam    -  Primary   Annual physical exam       Health maintenance reviewed during visit today.  Labs ordered.  Vaccines up to date.  Cologuard completed.  CT lung ordered.   Relevant Orders   TSH   PSA   Lipid panel   CBC with Differential/Platelet   Comprehensive metabolic panel   Urinalysis, Routine w reflex microscopic   Need for influenza vaccination       Relevant Orders   Flu Vaccine Trivalent High Dose (Fluad) (Completed)        Preventative Services:  Health Risk Assessment and Personalized Prevention Plan: up to date Bone Mass Measurements: NA CVD Screening: Up to date Colon Cancer Screening: Up to date Depression Screening: Up to date Diabetes Screening: Up to date Glaucoma Screening: Up to date Hepatitis B vaccine: NA Hepatitis C screening: Up to date HIV Screening:Up to date Flu Vaccine: Given Today Lung cancer Screening: Ordered today Obesity Screening: Up to date Pneumonia Vaccines (2): Up to date STI Screening: NA PSA screening: Done today  Discussed aspirin prophylaxis for myocardial infarction prevention and decision was it was not indicated  LABORATORY TESTING:  Health maintenance labs ordered today as discussed above.   The natural history of prostate cancer and ongoing  controversy regarding screening and potential treatment outcomes of prostate cancer has been discussed with the patient. The meaning of a false positive PSA and a false negative PSA has been discussed. He indicates understanding of the limitations of this screening test and wishes to proceed with screening PSA testing.   IMMUNIZATIONS:   - Tdap: Tetanus vaccination status reviewed: last tetanus booster within 10 years. - Influenza: Administered today - Pneumovax: Up to date - Prevnar: Up to date - Zostavax vaccine: Refused  SCREENING: - Colonoscopy: Up to date  Discussed with patient purpose of the colonoscopy is to detect colon cancer at curable precancerous or early stages   - AAA Screening: Not applicable  -Hearing Test: Not applicable  -Spirometry: Not applicable  PATIENT COUNSELING:    Sexuality: Discussed sexually transmitted diseases, partner selection, use of condoms, avoidance of unintended pregnancy  and contraceptive alternatives.   Advised to avoid cigarette smoking.  I discussed with the patient that most people either abstain from alcohol or drink within safe limits (<=14/week and <=4 drinks/occasion for males, <=7/weeks and <= 3 drinks/occasion for females) and that the risk for alcohol disorders and other health effects rises proportionally with the number of drinks per week and how often a drinker exceeds daily limits.  Discussed cessation/primary prevention of drug use and availability of treatment for abuse.   Diet: Encouraged to adjust caloric intake to maintain  or achieve ideal body weight, to reduce intake of dietary saturated fat and total fat, to limit sodium intake by avoiding high sodium foods and not adding table salt, and to maintain adequate dietary potassium and calcium preferably from fresh fruits, vegetables, and low-fat dairy products.    stressed the importance of regular exercise  Injury prevention: Discussed safety belts, safety helmets, smoke  detector, smoking near bedding or upholstery.   Dental health: Discussed importance of regular tooth brushing, flossing, and dental visits.   Follow up plan: NEXT PREVENTATIVE PHYSICAL DUE IN 1 YEAR. Return in about 6 months (around 07/18/2023) for HTN, HLD, DM2 FU.

## 2023-01-17 NOTE — Assessment & Plan Note (Signed)
Carotid duplex shows stable 1-39% right ICA stenosis with a known left carotid occlusion.  Continue aspirin and statin agent.  Continue to follow on an annual basis.  No role for intervention at this time.

## 2023-01-17 NOTE — Assessment & Plan Note (Signed)
Chronic, ongoing.  Noted on CT imaging, reviewed the diagnosis found on CT with patient again. Educated him on this diagnosis.  No current inhalers or symptoms. Does not feel like he needs them at this time.  Walks daily.  Encouraged to continue exercise.  CT Lung ordered.  Has not had CT Lung since 2020.  Recommend continued cessation of smoking.  Follow up in 6 months.  Call sooner if concerns arise.

## 2023-01-17 NOTE — Assessment & Plan Note (Signed)
Chronic.  Controlled.  Continue with current medication regimen.  Labs ordered today.  Return to clinic in 6 months for reevaluation.  Call sooner if concerns arise.  ? ?

## 2023-01-17 NOTE — Assessment & Plan Note (Signed)
A voluntary discussion about advance care planning including the explanation and discussion of advance directives was extensively discussed  with the patient for 5 minutes with patient.  Explanation about the health care proxy and Living will was reviewed and packet with forms with explanation of how to fill them out was given.  During this discussion, the patient was able to identify a health care proxy as his son and does not want to make any changes at this time.

## 2023-01-17 NOTE — Assessment & Plan Note (Signed)
Noted on CT imaging 11/05/2018.  Patient has not smoked since October 2021. Recommend continued cessation of smoking and to continue statin daily + ASA for prevention.  Discussed importance of keeping up with statin to prevent worsening stenosis.  Will continue to reassess at future visits. Follow up in 6 months.  Call sooner if concerns arise.

## 2023-01-17 NOTE — Assessment & Plan Note (Signed)
Chronic.  Controlled.  Continue with current medication regimen of Atorvastatin 20mg  daily.  Refills sent today. Labs ordered today.  Return to clinic in 6 months for reevaluation.  Call sooner if concerns arise.

## 2023-01-17 NOTE — Assessment & Plan Note (Signed)
Chronic.  Controlled.  Continue with current medication regimen of Amlodipine 5mg , Lisinopril 20mg  and HCTZ 25mg  daily.  Refills sent today.  Labs ordered today.  Return to clinic in 6 months for reevaluation.  Call sooner if concerns arise.

## 2023-01-18 LAB — COMPREHENSIVE METABOLIC PANEL
ALT: 25 [IU]/L (ref 0–44)
AST: 26 [IU]/L (ref 0–40)
Albumin: 4.2 g/dL (ref 3.8–4.8)
Alkaline Phosphatase: 74 [IU]/L (ref 44–121)
BUN/Creatinine Ratio: 19 (ref 10–24)
BUN: 29 mg/dL — ABNORMAL HIGH (ref 8–27)
Bilirubin Total: 0.5 mg/dL (ref 0.0–1.2)
CO2: 22 mmol/L (ref 20–29)
Calcium: 9.4 mg/dL (ref 8.6–10.2)
Chloride: 95 mmol/L — ABNORMAL LOW (ref 96–106)
Creatinine, Ser: 1.56 mg/dL — ABNORMAL HIGH (ref 0.76–1.27)
Globulin, Total: 2.9 g/dL (ref 1.5–4.5)
Glucose: 85 mg/dL (ref 70–99)
Potassium: 4.4 mmol/L (ref 3.5–5.2)
Sodium: 133 mmol/L — ABNORMAL LOW (ref 134–144)
Total Protein: 7.1 g/dL (ref 6.0–8.5)
eGFR: 47 mL/min/{1.73_m2} — ABNORMAL LOW (ref >59)

## 2023-01-18 LAB — LIPID PANEL
Chol/HDL Ratio: 2.6 {ratio} (ref 0.0–5.0)
Cholesterol, Total: 169 mg/dL (ref 100–199)
HDL: 64 mg/dL (ref >39)
LDL Chol Calc (NIH): 89 mg/dL (ref 0–99)
Triglycerides: 89 mg/dL (ref 0–149)
VLDL Cholesterol Cal: 16 mg/dL (ref 5–40)

## 2023-01-18 LAB — CBC WITH DIFFERENTIAL/PLATELET
Basophils Absolute: 0.1 10*3/uL (ref 0.0–0.2)
Basos: 1 %
EOS (ABSOLUTE): 0.2 10*3/uL (ref 0.0–0.4)
Eos: 3 %
Hematocrit: 43 % (ref 37.5–51.0)
Hemoglobin: 14.5 g/dL (ref 13.0–17.7)
Immature Grans (Abs): 0.1 10*3/uL (ref 0.0–0.1)
Immature Granulocytes: 1 %
Lymphocytes Absolute: 2.7 10*3/uL (ref 0.7–3.1)
Lymphs: 32 %
MCH: 31 pg (ref 26.6–33.0)
MCHC: 33.7 g/dL (ref 31.5–35.7)
MCV: 92 fL (ref 79–97)
Monocytes Absolute: 0.7 10*3/uL (ref 0.1–0.9)
Monocytes: 8 %
Neutrophils Absolute: 4.8 10*3/uL (ref 1.4–7.0)
Neutrophils: 55 %
Platelets: 200 10*3/uL (ref 150–450)
RBC: 4.68 x10E6/uL (ref 4.14–5.80)
RDW: 12.7 % (ref 11.6–15.4)
WBC: 8.6 10*3/uL (ref 3.4–10.8)

## 2023-01-18 LAB — TSH: TSH: 2.74 u[IU]/mL (ref 0.450–4.500)

## 2023-01-18 LAB — PSA: Prostate Specific Ag, Serum: 3.7 ng/mL (ref 0.0–4.0)

## 2023-01-18 NOTE — Progress Notes (Signed)
Please let patient know that her lab work looks good.  His kidney function remains stable.  No concerns at this time.  Continue with current medication regimen.  Follow up as discussed.

## 2023-04-17 DIAGNOSIS — H25813 Combined forms of age-related cataract, bilateral: Secondary | ICD-10-CM | POA: Diagnosis not present

## 2023-04-17 DIAGNOSIS — H35342 Macular cyst, hole, or pseudohole, left eye: Secondary | ICD-10-CM | POA: Diagnosis not present

## 2023-07-18 ENCOUNTER — Ambulatory Visit: Payer: Self-pay | Admitting: Nurse Practitioner

## 2023-08-04 ENCOUNTER — Other Ambulatory Visit: Payer: Self-pay | Admitting: Nurse Practitioner

## 2023-08-07 NOTE — Telephone Encounter (Signed)
 OFFICE VISIT NEEDED FOR ADDITIONAL REFILLS   Requested Prescriptions  Pending Prescriptions Disp Refills   amLODipine  (NORVASC ) 5 MG tablet [Pharmacy Med Name: amLODIPine  Besylate Oral Tablet 5 MG] 30 tablet 0    Sig: TAKE 1 TABLET EVERY DAY     Cardiovascular: Calcium  Channel Blockers 2 Failed - 08/07/2023 11:14 AM      Failed - Valid encounter within last 6 months    Recent Outpatient Visits   None            Passed - Last BP in normal range    BP Readings from Last 1 Encounters:  01/17/23 119/70         Passed - Last Heart Rate in normal range    Pulse Readings from Last 1 Encounters:  01/17/23 86          lisinopril -hydrochlorothiazide  (ZESTORETIC ) 20-25 MG tablet [Pharmacy Med Name: Lisinopril -hydroCHLOROthiazide  Oral Tablet 20-25 MG] 30 tablet 0    Sig: TAKE 1 TABLET EVERY DAY     Cardiovascular:  ACEI + Diuretic Combos Failed - 08/07/2023 11:14 AM      Failed - Na in normal range and within 180 days    Sodium  Date Value Ref Range Status  01/17/2023 133 (L) 134 - 144 mmol/L Final         Failed - K in normal range and within 180 days    Potassium  Date Value Ref Range Status  01/17/2023 4.4 3.5 - 5.2 mmol/L Final         Failed - Cr in normal range and within 180 days    Creatinine, Ser  Date Value Ref Range Status  01/17/2023 1.56 (H) 0.76 - 1.27 mg/dL Final         Failed - eGFR is 30 or above and within 180 days    GFR calc Af Amer  Date Value Ref Range Status  10/10/2019 54 (L) >59 mL/min/1.73 Final    Comment:    **[facility] currently reports eGFR in compliance with the current**   recommendations of the [employer]. [facility] will   update reporting as new guidelines are published from the NKF-ASN   Task force.    GFR calc non Af Amer  Date Value Ref Range Status  10/10/2019 46 (L) >59 mL/min/1.73 Final   eGFR  Date Value Ref Range Status  01/17/2023 47 (L) >59 mL/min/1.73 Final         Failed - Valid encounter within last 6  months    Recent Outpatient Visits   None            Passed - Patient is not pregnant      Passed - Last BP in normal range    BP Readings from Last 1 Encounters:  01/17/23 119/70          atorvastatin  (LIPITOR) 20 MG tablet [Pharmacy Med Name: Atorvastatin  Calcium  Oral Tablet 20 MG] 30 tablet 0    Sig: TAKE 1 TABLET EVERY DAY     Cardiovascular:  Antilipid - Statins Failed - 08/07/2023 11:14 AM      Failed - Valid encounter within last 12 months    Recent Outpatient Visits   None            Failed - Lipid Panel in normal range within the last 12 months    Cholesterol, Total  Date Value Ref Range Status  01/17/2023 169 100 - 199 mg/dL Final   Cholesterol Piccolo, Waived  Date Value  Ref Range Status  01/19/2015 138 <200 mg/dL Final    Comment:                            Desirable                <200                         Borderline High      200- 239                         High                     >239    LDL Chol Calc (NIH)  Date Value Ref Range Status  01/17/2023 89 0 - 99 mg/dL Final   HDL  Date Value Ref Range Status  01/17/2023 64 >39 mg/dL Final   Triglycerides  Date Value Ref Range Status  01/17/2023 89 0 - 149 mg/dL Final   Triglycerides Piccolo,Waived  Date Value Ref Range Status  01/19/2015 117 <150 mg/dL Final    Comment:                            Normal                   <150                         Borderline High     150 - 199                         High                200 - 499                         Very High                >499          Passed - Patient is not pregnant

## 2023-08-28 ENCOUNTER — Other Ambulatory Visit: Payer: Self-pay | Admitting: Nurse Practitioner

## 2023-08-30 ENCOUNTER — Ambulatory Visit (INDEPENDENT_AMBULATORY_CARE_PROVIDER_SITE_OTHER): Admitting: Nurse Practitioner

## 2023-08-30 ENCOUNTER — Encounter: Payer: Self-pay | Admitting: Nurse Practitioner

## 2023-08-30 VITALS — BP 105/66 | HR 74 | Temp 98.2°F | Resp 15 | Ht 72.01 in | Wt 161.0 lb

## 2023-08-30 DIAGNOSIS — I10 Essential (primary) hypertension: Secondary | ICD-10-CM

## 2023-08-30 DIAGNOSIS — J432 Centrilobular emphysema: Secondary | ICD-10-CM

## 2023-08-30 DIAGNOSIS — N1831 Chronic kidney disease, stage 3a: Secondary | ICD-10-CM | POA: Diagnosis not present

## 2023-08-30 DIAGNOSIS — I700 Atherosclerosis of aorta: Secondary | ICD-10-CM | POA: Diagnosis not present

## 2023-08-30 DIAGNOSIS — I6523 Occlusion and stenosis of bilateral carotid arteries: Secondary | ICD-10-CM

## 2023-08-30 DIAGNOSIS — E782 Mixed hyperlipidemia: Secondary | ICD-10-CM

## 2023-08-30 MED ORDER — LISINOPRIL-HYDROCHLOROTHIAZIDE 20-25 MG PO TABS: 1.0000 | ORAL_TABLET | Freq: Every day | ORAL | 1 refills | Status: DC

## 2023-08-30 MED ORDER — ATORVASTATIN CALCIUM 20 MG PO TABS: 20.0000 mg | ORAL_TABLET | Freq: Every day | ORAL | 1 refills | Status: DC

## 2023-08-30 MED ORDER — AMLODIPINE BESYLATE 5 MG PO TABS: 5.0000 mg | ORAL_TABLET | Freq: Every day | ORAL | 1 refills | Status: DC

## 2023-08-30 NOTE — Assessment & Plan Note (Signed)
 Chronic.  Controlled.  Continue with current medication regimen of Amlodipine  5mg , Lisinopril  20mg  and HCTZ 25mg  daily.  Refills sent today.  Recommend checking blood pressures at home and bring log to next visit.   Labs ordered today.  Return to clinic in 6 months for reevaluation.  Call sooner if concerns arise.

## 2023-08-30 NOTE — Assessment & Plan Note (Signed)
 Chronic.  Controlled.  Continue with current medication regimen of Atorvastatin 20mg  daily.  Refills sent today.  Labs ordered today.  Return to clinic in 6 months for reevaluation.  Call sooner if concerns arise.

## 2023-08-30 NOTE — Assessment & Plan Note (Signed)
 Chronic.  Controlled.  Continue with current medication regimen.  Continue with ACE for kidney protection.  Labs ordered today.  Return to clinic in 6 months for reevaluation.  Call sooner if concerns arise.

## 2023-08-30 NOTE — Assessment & Plan Note (Signed)
Noted on CT imaging 11/05/2018.  Patient has not smoked since October 2021. Recommend continued cessation of smoking and to continue statin daily + ASA for prevention.  Discussed importance of keeping up with statin to prevent worsening stenosis.  Will continue to reassess at future visits. Follow up in 6 months.  Call sooner if concerns arise.

## 2023-08-30 NOTE — Assessment & Plan Note (Signed)
Carotid duplex shows stable 1-39% right ICA stenosis with a known left carotid occlusion.  Continue aspirin and statin agent.  Continue to follow on an annual basis.  No role for intervention at this time.

## 2023-08-30 NOTE — Assessment & Plan Note (Addendum)
 Chronic, ongoing.  Noted on CT imaging, reviewed the diagnosis found on CT with patient again. Note reviewed from previous visit.  Educated him on this diagnosis.  No current inhalers or symptoms. Does not feel like he needs them at this time.  Walks daily.  Encouraged to continue exercise.  CT Lung is scheduled for July.  Recommend continued cessation of smoking.  Follow up in 6 months.  Call sooner if concerns arise.

## 2023-08-30 NOTE — Progress Notes (Signed)
 BP 105/66 (BP Location: Left Arm, Patient Position: Sitting, Cuff Size: Normal)   Pulse 74   Temp 98.2 F (36.8 C) (Oral)   Resp 15   Ht 6' 0.01" (1.829 m)   Wt 161 lb (73 kg)   SpO2 97%   BMI 21.83 kg/m    Subjective:    Patient ID: Aaron Boyd, male    DOB: [birth year], 73 y.o.   MRN: [id number]  HPI: Aaron Boyd is a 73 y.o. male  Chief Complaint  Patient presents with   Hypertension    Used to check at home and no longer checks it at home.    Chronic Kidney Disease    No concerns for him right now.    COPD    Sometimes strenuous activity does him to get more winded than prior.    HYPERTENSION / HYPERLIPIDEMIA Doing well with current regimen.  Not checking blood pressures at home.  Satisfied with current treatment? yes Duration of hypertension: years BP monitoring frequency: not checking BP range:  BP medication side effects: no Past BP meds: amlodipine  and lisinopril -HCTZ Duration of hyperlipidemia: years Cholesterol medication side effects: no Cholesterol supplements: none Past cholesterol medications: atorvastain (lipitor) Medication compliance: excellent compliance Aspirin: yes Recent stressors: no Recurrent headaches: no Visual changes: no Palpitations: no Dyspnea: no Chest pain: no Lower extremity edema: no Dizzy/lightheaded: no  COPD COPD status: controlled Satisfied with current treatment?: yes Oxygen use: no Dyspnea frequency: walking daily Cough frequency: not very often Rescue inhaler frequency:   Limitation of activity: no Productive cough:  Last Spirometry:  Pneumovax: Up to Date Influenza: Up to Date  CHRONIC KIDNEY DISEASE CKD status: controlled Medications renally dose: yes Previous renal evaluation: no Pneumovax:  Up to Date Influenza Vaccine:  Up to Date  Relevant past medical, surgical, family and social history reviewed and updated as indicated. Interim medical history since our last visit reviewed. Allergies and  medications reviewed and updated.  Review of Systems  Eyes:  Negative for visual disturbance.  Respiratory:  Negative for chest tightness and shortness of breath.   Cardiovascular:  Negative for chest pain, palpitations and leg swelling.  Neurological:  Negative for dizziness, light-headedness and headaches.    Per HPI unless specifically indicated above     Objective:     BP 105/66 (BP Location: Left Arm, Patient Position: Sitting, Cuff Size: Normal)   Pulse 74   Temp 98.2 F (36.8 C) (Oral)   Resp 15   Ht 6' 0.01" (1.829 m)   Wt 161 lb (73 kg)   SpO2 97%   BMI 21.83 kg/m   Wt Readings from Last 3 Encounters:  08/30/23 161 lb (73 kg)  01/17/23 162 lb 12.8 oz (73.8 kg)  12/05/22 162 lb 6.4 oz (73.7 kg)    Physical Exam Vitals and nursing note reviewed.  Constitutional:      General: He is not in acute distress.    Appearance: Normal appearance. He is not ill-appearing, toxic-appearing or diaphoretic.  HENT:     Head: Normocephalic.     Right Ear: External ear normal.     Left Ear: External ear normal.     Nose: Nose normal. No congestion or rhinorrhea.     Mouth/Throat:     Mouth: Mucous membranes are moist.  Eyes:     General:        Right eye: No discharge.        Left eye: No discharge.     Extraocular  Movements: Extraocular movements intact.     Conjunctiva/sclera: Conjunctivae normal.     Pupils: Pupils are equal, round, and reactive to light.  Cardiovascular:     Rate and Rhythm: Normal rate and regular rhythm.     Heart sounds: No murmur heard. Pulmonary:     Effort: Pulmonary effort is normal. No respiratory distress.     Breath sounds: Normal breath sounds. No wheezing, rhonchi or rales.  Abdominal:     General: Abdomen is flat. Bowel sounds are normal.  Musculoskeletal:     Cervical back: Normal range of motion and neck supple.  Skin:    General: Skin is warm and dry.     Capillary Refill: Capillary refill takes less than 2 seconds.   Neurological:     General: No focal deficit present.     Mental Status: He is alert and oriented to person, place, and time.  Psychiatric:        Mood and Affect: Mood normal.        Behavior: Behavior normal.        Thought Content: Thought content normal.        Judgment: Judgment normal.     Results for orders placed or performed in visit on 01/17/23  Microscopic Examination   Collection Time: 01/17/23  2:59 PM   Urine  Result Value Ref Range   WBC, UA None seen 0 - 5 /hpf   RBC, Urine 0-2 0 - 2 /hpf   Epithelial Cells (non renal) 0-10 0 - 10 /hpf   Bacteria, UA None seen None seen/Few  Urinalysis, Routine w reflex microscopic   Collection Time: 01/17/23  2:59 PM  Result Value Ref Range   Specific Gravity, UA 1.010 1.005 - 1.030   pH, UA 6.0 5.0 - 7.5   Color, UA Yellow Yellow   Appearance Ur Clear Clear   Leukocytes,UA Negative Negative   Protein,UA Negative Negative/Trace   Glucose, UA Negative Negative   Ketones, UA Negative Negative   RBC, UA 2+ (A) Negative   Bilirubin, UA Negative Negative   Urobilinogen, Ur 0.2 0.2 - 1.0 mg/dL   Nitrite, UA Negative Negative   Microscopic Examination See below:   TSH   Collection Time: 01/17/23  3:00 PM  Result Value Ref Range   TSH 2.740 0.450 - 4.500 uIU/mL  PSA   Collection Time: 01/17/23  3:00 PM  Result Value Ref Range   Prostate Specific Ag, Serum 3.7 0.0 - 4.0 ng/mL  Lipid panel   Collection Time: 01/17/23  3:00 PM  Result Value Ref Range   Cholesterol, Total 169 100 - 199 mg/dL   Triglycerides 89 0 - 149 mg/dL   HDL 64 >16 mg/dL   VLDL Cholesterol Cal 16 5 - 40 mg/dL   LDL Chol Calc (NIH) 89 0 - 99 mg/dL   Chol/HDL Ratio 2.6 0.0 - 5.0 ratio  CBC with Differential/Platelet   Collection Time: 01/17/23  3:00 PM  Result Value Ref Range   WBC 8.6 3.4 - 10.8 x10E3/uL   RBC 4.68 4.14 - 5.80 x10E6/uL   Hemoglobin 14.5 13.0 - 17.7 g/dL   Hematocrit 10.9 60.4 - 51.0 %   MCV 92 79 - 97 fL   MCH 31.0 26.6 - 33.0 pg    MCHC 33.7 31.5 - 35.7 g/dL   RDW 54.0 98.1 - 19.1 %   Platelets 200 150 - 450 x10E3/uL   Neutrophils 55 Not Estab. %   Lymphs 32 Not Estab. %  Monocytes 8 Not Estab. %   Eos 3 Not Estab. %   Basos 1 Not Estab. %   Neutrophils Absolute 4.8 1.4 - 7.0 x10E3/uL   Lymphocytes Absolute 2.7 0.7 - 3.1 x10E3/uL   Monocytes Absolute 0.7 0.1 - 0.9 x10E3/uL   EOS (ABSOLUTE) 0.2 0.0 - 0.4 x10E3/uL   Basophils Absolute 0.1 0.0 - 0.2 x10E3/uL   Immature Granulocytes 1 Not Estab. %   Immature Grans (Abs) 0.1 0.0 - 0.1 x10E3/uL  Comprehensive metabolic panel   Collection Time: 01/17/23  3:00 PM  Result Value Ref Range   Glucose 85 70 - 99 mg/dL   BUN 29 (H) 8 - 27 mg/dL   Creatinine, Ser 4.78 (H) 0.76 - 1.27 mg/dL   eGFR 47 (L) >29 FA/OZH/0.86   BUN/Creatinine Ratio 19 10 - 24   Sodium 133 (L) 134 - 144 mmol/L   Potassium 4.4 3.5 - 5.2 mmol/L   Chloride 95 (L) 96 - 106 mmol/L   CO2 22 20 - 29 mmol/L   Calcium  9.4 8.6 - 10.2 mg/dL   Total Protein 7.1 6.0 - 8.5 g/dL   Albumin 4.2 3.8 - 4.8 g/dL   Globulin, Total 2.9 1.5 - 4.5 g/dL   Bilirubin Total 0.5 0.0 - 1.2 mg/dL   Alkaline Phosphatase 74 44 - 121 IU/L   AST 26 0 - 40 IU/L   ALT 25 0 - 44 IU/L      Assessment & Plan:   Problem List Items Addressed This Visit       Cardiovascular and Mediastinum   Bilateral carotid artery disease (HCC)   Carotid duplex shows stable 1-39% right ICA stenosis with a known left carotid occlusion.  Continue aspirin and statin agent.  Continue to follow on an annual basis.  No role for intervention at this time.       Relevant Medications   amLODipine  (NORVASC ) 5 MG tablet   atorvastatin  (LIPITOR) 20 MG tablet   lisinopril -hydrochlorothiazide  (ZESTORETIC ) 20-25 MG tablet   Essential (primary) hypertension   Chronic.  Controlled.  Continue with current medication regimen of Amlodipine  5mg , Lisinopril  20mg  and HCTZ 25mg  daily.  Refills sent today.  Recommend checking blood pressures at home and bring  log to next visit.   Labs ordered today.  Return to clinic in 6 months for reevaluation.  Call sooner if concerns arise.       Relevant Medications   amLODipine  (NORVASC ) 5 MG tablet   atorvastatin  (LIPITOR) 20 MG tablet   lisinopril -hydrochlorothiazide  (ZESTORETIC ) 20-25 MG tablet   Aortic atherosclerosis (HCC)   Noted on CT imaging 11/05/2018.  Patient has not smoked since October 2021. Recommend continued cessation of smoking and to continue statin daily + ASA for prevention.  Discussed importance of keeping up with statin to prevent worsening stenosis.  Will continue to reassess at future visits. Follow up in 6 months.  Call sooner if concerns arise.      Relevant Medications   amLODipine  (NORVASC ) 5 MG tablet   atorvastatin  (LIPITOR) 20 MG tablet   lisinopril -hydrochlorothiazide  (ZESTORETIC ) 20-25 MG tablet     Respiratory   Chronic obstructive pulmonary disease (HCC)   Chronic, ongoing.  Noted on CT imaging, reviewed the diagnosis found on CT with patient again. Note reviewed from previous visit.  Educated him on this diagnosis.  No current inhalers or symptoms. Does not feel like he needs them at this time.  Walks daily.  Encouraged to continue exercise.  CT Lung is scheduled for July.  Recommend continued cessation of smoking.  Follow up in 6 months.  Call sooner if concerns arise.        Genitourinary   Chronic kidney disease, stage 3 (HCC) - Primary (Chronic)   Chronic.  Controlled.  Continue with current medication regimen.  Continue with ACE for kidney protection.  Labs ordered today.  Return to clinic in 6 months for reevaluation.  Call sooner if concerns arise.       Relevant Orders   Comp Met (CMET)     Other   Hyperlipidemia   Chronic.  Controlled.  Continue with current medication regimen of Atorvastatin  20mg  daily.  Refills sent today. Labs ordered today.  Return to clinic in 6 months for reevaluation.  Call sooner if concerns arise.       Relevant Medications    amLODipine  (NORVASC ) 5 MG tablet   atorvastatin  (LIPITOR) 20 MG tablet   lisinopril -hydrochlorothiazide  (ZESTORETIC ) 20-25 MG tablet   Other Relevant Orders   Lipid Profile     Follow up plan: Return in about 6 months (around 03/01/2024) for Physical and Fasting labs.

## 2023-08-30 NOTE — Telephone Encounter (Signed)
 Requested Prescriptions  Pending Prescriptions Disp Refills   amLODipine  (NORVASC ) 5 MG tablet [Pharmacy Med Name: amLODIPine  Besylate Oral Tablet 5 MG] 90 tablet 0    Sig: TAKE 1 TABLET EVERY DAY     Cardiovascular: Calcium  Channel Blockers 2 Failed - 08/30/2023  1:12 PM      Failed - Valid encounter within last 6 months    Recent Outpatient Visits           Today Stage 3a chronic kidney disease (HCC)   [place] [facility] Aileen Alexanders, NP              Passed - Last BP in normal range    BP Readings from Last 1 Encounters:  01/17/23 119/70         Passed - Last Heart Rate in normal range    Pulse Readings from Last 1 Encounters:  01/17/23 86          atorvastatin  (LIPITOR) 20 MG tablet [Pharmacy Med Name: Atorvastatin  Calcium  Oral Tablet 20 MG] 90 tablet 0    Sig: TAKE 1 TABLET EVERY DAY     Cardiovascular:  Antilipid - Statins Failed - 08/30/2023  1:12 PM      Failed - Valid encounter within last 12 months    Recent Outpatient Visits           Today Stage 3a chronic kidney disease (HCC)   [place] [facility] Aileen Alexanders, NP              Failed - Lipid Panel in normal range within the last 12 months    Cholesterol, Total  Date Value Ref Range Status  01/17/2023 169 100 - 199 mg/dL Final   Cholesterol Piccolo, Waived  Date Value Ref Range Status  01/19/2015 138 <200 mg/dL Final    Comment:                            Desirable                <200                         Borderline High      200- 239                         High                     >239    LDL Chol Calc (NIH)  Date Value Ref Range Status  01/17/2023 89 0 - 99 mg/dL Final   HDL  Date Value Ref Range Status  01/17/2023 64 >39 mg/dL Final   Triglycerides  Date Value Ref Range Status  01/17/2023 89 0 - 149 mg/dL Final   Triglycerides Piccolo,Waived  Date Value Ref Range Status  01/19/2015 117 <150 mg/dL Final    Comment:                             Normal                   <150                         Borderline High     150 - 199  High                200 - 499                         Very High                >499          Passed - Patient is not pregnant       lisinopril -hydrochlorothiazide  (ZESTORETIC ) 20-25 MG tablet [Pharmacy Med Name: Lisinopril -hydroCHLOROthiazide  Oral Tablet 20-25 MG] 90 tablet 0    Sig: TAKE 1 TABLET EVERY DAY     Cardiovascular:  ACEI + Diuretic Combos Failed - 08/30/2023  1:12 PM      Failed - Na in normal range and within 180 days    Sodium  Date Value Ref Range Status  01/17/2023 133 (L) 134 - 144 mmol/L Final         Failed - K in normal range and within 180 days    Potassium  Date Value Ref Range Status  01/17/2023 4.4 3.5 - 5.2 mmol/L Final         Failed - Cr in normal range and within 180 days    Creatinine, Ser  Date Value Ref Range Status  01/17/2023 1.56 (H) 0.76 - 1.27 mg/dL Final         Failed - eGFR is 30 or above and within 180 days    GFR calc Af Amer  Date Value Ref Range Status  10/10/2019 54 (L) >59 mL/min/1.73 Final    Comment:    **[facility] currently reports eGFR in compliance with the current**   recommendations of the [employer]. [facility] will   update reporting as new guidelines are published from the NKF-ASN   Task force.    GFR calc non Af Amer  Date Value Ref Range Status  10/10/2019 46 (L) >59 mL/min/1.73 Final   eGFR  Date Value Ref Range Status  01/17/2023 47 (L) >59 mL/min/1.73 Final         Failed - Valid encounter within last 6 months    Recent Outpatient Visits           Today Stage 3a chronic kidney disease [facility])   [place] [facility] Aileen Alexanders, NP              Passed - Patient is not pregnant      Passed - Last BP in normal range    BP Readings from Last 1 Encounters:  01/17/23 119/70

## 2023-08-31 ENCOUNTER — Ambulatory Visit: Payer: Self-pay | Admitting: Nurse Practitioner

## 2023-08-31 ENCOUNTER — Ambulatory Visit: Payer: Self-pay

## 2023-08-31 LAB — LIPID PANEL
Chol/HDL Ratio: 2.8 ratio (ref 0.0–5.0)
Cholesterol, Total: 153 mg/dL (ref 100–199)
HDL: 54 mg/dL (ref >39)
LDL Chol Calc (NIH): 74 mg/dL (ref 0–99)
Triglycerides: 144 mg/dL (ref 0–149)
VLDL Cholesterol Cal: 25 mg/dL (ref 5–40)

## 2023-08-31 LAB — COMPREHENSIVE METABOLIC PANEL WITH GFR
ALT: 17 IU/L (ref 0–44)
AST: 17 IU/L (ref 0–40)
Albumin: 4 g/dL (ref 3.8–4.8)
Alkaline Phosphatase: 68 IU/L (ref 44–121)
BUN/Creatinine Ratio: 25 — ABNORMAL HIGH (ref 10–24)
BUN: 45 mg/dL — ABNORMAL HIGH (ref 8–27)
Bilirubin Total: 0.5 mg/dL (ref 0.0–1.2)
CO2: 19 mmol/L — ABNORMAL LOW (ref 20–29)
Calcium: 9.1 mg/dL (ref 8.6–10.2)
Chloride: 99 mmol/L (ref 96–106)
Creatinine, Ser: 1.82 mg/dL — ABNORMAL HIGH (ref 0.76–1.27)
Globulin, Total: 3.1 g/dL (ref 1.5–4.5)
Glucose: 74 mg/dL (ref 70–99)
Potassium: 4.2 mmol/L (ref 3.5–5.2)
Sodium: 134 mmol/L (ref 134–144)
Total Protein: 7.1 g/dL (ref 6.0–8.5)
eGFR: 39 mL/min/{1.73_m2} — ABNORMAL LOW (ref >59)

## 2023-08-31 NOTE — Telephone Encounter (Signed)
 Patient called back-provider note under lab work was discussed with patient. Patient states he hasn't been to a nephrologist before so he would be a referral. Patient is asking for a copy of his lab work be mailed to him. All questions were answered.   Copied from CRM [id number]. Topic: Clinical - Lab/Test Results >> Aug 31, 2023  1:00 PM Aaron Boyd wrote: Reason for CRM: Patient is returning a call regarding his test results. Reason for Disposition  Caller requesting lab results  (Exception: Routine or non-urgent lab result.)  Answer Assessment - Initial Assessment Questions 1. REASON FOR CALL or QUESTION: "What is your reason for calling today?" or "How can I best help you?" or "What question do you have that I can help answer?"     Patient returning a phone call in regards to lab results.  2. CALLER: Document the source of call. (e.g., laboratory, patient).     patient  Protocols used: PCP Call - No Triage-A-AH

## 2023-08-31 NOTE — Telephone Encounter (Signed)
Labs printed and mailed to the patient as requested.  

## 2023-12-10 ENCOUNTER — Other Ambulatory Visit (INDEPENDENT_AMBULATORY_CARE_PROVIDER_SITE_OTHER): Payer: Self-pay | Admitting: Vascular Surgery

## 2023-12-10 DIAGNOSIS — I6523 Occlusion and stenosis of bilateral carotid arteries: Secondary | ICD-10-CM

## 2023-12-11 ENCOUNTER — Encounter (INDEPENDENT_AMBULATORY_CARE_PROVIDER_SITE_OTHER): Payer: Self-pay | Admitting: Vascular Surgery

## 2023-12-11 ENCOUNTER — Other Ambulatory Visit (INDEPENDENT_AMBULATORY_CARE_PROVIDER_SITE_OTHER): Payer: Medicare HMO

## 2023-12-11 ENCOUNTER — Ambulatory Visit (INDEPENDENT_AMBULATORY_CARE_PROVIDER_SITE_OTHER): Payer: Medicare HMO | Admitting: Vascular Surgery

## 2023-12-11 VITALS — BP 135/72 | HR 85 | Ht 72.0 in | Wt 163.0 lb

## 2023-12-11 DIAGNOSIS — I10 Essential (primary) hypertension: Secondary | ICD-10-CM

## 2023-12-11 DIAGNOSIS — E782 Mixed hyperlipidemia: Secondary | ICD-10-CM | POA: Diagnosis not present

## 2023-12-11 DIAGNOSIS — I6523 Occlusion and stenosis of bilateral carotid arteries: Secondary | ICD-10-CM

## 2023-12-11 NOTE — Progress Notes (Signed)
 MRN : [id number]  Aaron Boyd is a 73 y.o. (Aug 24, 1950) male who presents with chief complaint of  Chief Complaint  Patient presents with   Carotid    - 1 year follow up with carotid  .  History of Present Illness: Patient returns today in follow up of his carotid disease.  He is doing well today without any focal neurologic symptoms since his last visit 1 year ago. Specifically, the patient denies amaurosis fugax, speech or swallowing difficulties, or arm or leg weakness or numbness.  Carotid duplex today reveals a chronic and known left carotid artery occlusion.  The right carotid artery continues to have velocities in the 1 to 39% range of stenosis.  Current Outpatient Medications  Medication Sig Dispense Refill   acetaminophen (TYLENOL) 325 MG tablet Take 650 mg by mouth every 6 (six) hours as needed.     amLODipine  (NORVASC ) 5 MG tablet Take 1 tablet (5 mg total) by mouth daily. 90 tablet 1   aspirin EC 81 MG tablet Take 81 mg by mouth daily. Swallow whole.     atorvastatin  (LIPITOR) 20 MG tablet Take 1 tablet (20 mg total) by mouth daily. 90 tablet 1   lisinopril -hydrochlorothiazide  (ZESTORETIC ) 20-25 MG tablet Take 1 tablet by mouth daily. 90 tablet 1   No current facility-administered medications for this visit.    Past Medical History:  Diagnosis Date   CVA (cerebral infarction) 2005   ED (erectile dysfunction)    Hematuria    Hyperlipidemia    Hypertension     Past Surgical History:  Procedure Laterality Date   clavicle surgery  1970   EYE SURGERY Left    left ear trauma     varicose veins       Social History   Tobacco Use   Smoking status: Former    Current packs/day: 0.00    Average packs/day: 0.5 packs/day for 44.0 years (22.0 ttl pk-yrs)    Types: Cigarettes    Start date: 01/15/1976    Quit date: 01/15/2020    Years since quitting: 3.9   Smokeless tobacco: Never  Vaping Use   Vaping status: Former   Quit date: 05/04/2017  Substance Use  Topics   Alcohol use: Yes    Comment: on occasion   Drug use: No      Family History  Problem Relation Age of Onset   Diabetes Mother    Heart disease Father    Hypertension Father    Heart disease Brother      No Known Allergies   REVIEW OF SYSTEMS (Negative unless checked)   Constitutional: [] Weight loss  [] Fever  [] Chills Cardiac: [] Chest pain   [] Chest pressure   [] Palpitations   [] Shortness of breath when laying flat   [] Shortness of breath at rest   [] Shortness of breath with exertion. Vascular:  [] Pain in legs with walking   [] Pain in legs at rest   [] Pain in legs when laying flat   [] Claudication   [] Pain in feet when walking  [] Pain in feet at rest  [] Pain in feet when laying flat   [] History of DVT   [] Phlebitis   [] Swelling in legs   [] Varicose veins   [] Non-healing ulcers Pulmonary:   [] Uses home oxygen   [] Productive cough   [] Hemoptysis   [] Wheeze  [] COPD   [] Asthma Neurologic:  [x] Dizziness  [] Blackouts   [] Seizures   [x] History of stroke   [x] History of TIA  [] Aphasia   [] Temporary blindness   []   Dysphagia   [] Weakness or numbness in arms   [] Weakness or numbness in legs Musculoskeletal:  [] Arthritis   [] Joint swelling   [] Joint pain   [] Low back pain Hematologic:  [] Easy bruising  [] Easy bleeding   [] Hypercoagulable state   [] Anemic  [] Hepatitis Gastrointestinal:  [] Blood in stool   [] Vomiting blood  [] Gastroesophageal reflux/heartburn   [] Difficulty swallowing. Genitourinary:  [] Chronic kidney disease   [] Difficult urination  [] Frequent urination  [] Burning with urination   [x] Blood in urine Skin:  [] Rashes   [] Ulcers   [] Wounds Psychological:  [] History of anxiety   []  History of major depression.  Physical Examination  BP 135/72   Pulse 85   Ht 6' (1.829 m)   Wt 163 lb (73.9 kg)   BMI 22.11 kg/m  Gen:  WD/WN, NAD Head: [place]/AT, No temporalis wasting. Ear/Nose/Throat: Hearing grossly intact, nares w/o erythema or drainage Eyes: Conjunctiva clear. Sclera  non-icteric Neck: Supple.  Trachea midline Pulmonary:  Good air movement, no use of accessory muscles.  Cardiac: RRR, no JVD Vascular:  Vessel Right Left  Radial Palpable Palpable       Musculoskeletal: M/S 5/5 throughout.  No deformity or atrophy. No edema. Neurologic: Sensation grossly intact in extremities.  Symmetrical.  Speech is fluent.  Psychiatric: Judgment intact, Mood & affect appropriate for pt's clinical situation. Dermatologic: No rashes or ulcers noted.  No cellulitis or open wounds.      Labs No results found for this or any previous visit (from the past 2160 hours).  Radiology No results found.  Assessment/Plan  Bilateral carotid artery disease (HCC) Carotid duplex today reveals a chronic and known left carotid artery occlusion.  The right carotid artery continues to have velocities in the 1 to 39% range of stenosis.  No role for intervention at this time.  Continue current medical regimen including aspirin and Lipitor.  Recheck in 1 year.  Essential (primary) hypertension blood pressure control important in reducing the progression of atherosclerotic disease. On appropriate oral medications.     Hyperlipidemia, unspecified lipid control important in reducing the progression of atherosclerotic disease. Continue statin therapy  Selinda Gu, MD  12/11/2023 2:06 PM    This note was created with Dragon medical transcription system.  Any errors from dictation are purely unintentional

## 2023-12-11 NOTE — Assessment & Plan Note (Signed)
 Carotid duplex today reveals a chronic and known left carotid artery occlusion.  The right carotid artery continues to have velocities in the 1 to 39% range of stenosis.  No role for intervention at this time.  Continue current medical regimen including aspirin and Lipitor.  Recheck in 1 year.

## 2024-01-26 ENCOUNTER — Other Ambulatory Visit: Payer: Self-pay | Admitting: Nurse Practitioner

## 2024-01-28 NOTE — Telephone Encounter (Signed)
 Requested Prescriptions  Pending Prescriptions Disp Refills   atorvastatin  (LIPITOR) 20 MG tablet [Pharmacy Med Name: ATORVASTATIN  CALCIUM  20 MG Oral Tablet] 90 tablet 3    Sig: TAKE 1 TABLET EVERY DAY     Cardiovascular:  Antilipid - Statins Failed - 01/28/2024  2:34 PM      Failed - Lipid Panel in normal range within the last 12 months    Cholesterol, Total  Date Value Ref Range Status  08/30/2023 153 100 - 199 mg/dL Final   Cholesterol Piccolo, Waived  Date Value Ref Range Status  01/19/2015 138 <200 mg/dL Final    Comment:                            Desirable                <200                         Borderline High      200- 239                         High                     >239    LDL Chol Calc (NIH)  Date Value Ref Range Status  08/30/2023 74 0 - 99 mg/dL Final   HDL  Date Value Ref Range Status  08/30/2023 54 >39 mg/dL Final   Triglycerides  Date Value Ref Range Status  08/30/2023 144 0 - 149 mg/dL Final   Triglycerides Piccolo,Waived  Date Value Ref Range Status  01/19/2015 117 <150 mg/dL Final    Comment:                            Normal                   <150                         Borderline High     150 - 199                         High                200 - 499                         Very High                >499          Passed - Patient is not pregnant      Passed - Valid encounter within last 12 months    Recent Outpatient Visits           5 months ago Stage 3a chronic kidney disease (HCC)   [facility] Melvin Pao, NP       Future Appointments             In 1 month Melvin Pao, NP [facility], [address]             amLODipine  (NORVASC ) 5 MG tablet [Pharmacy Med Name: AMLODIPINE  BESYLATE 5 MG Oral Tablet] 90 tablet 3    Sig: TAKE  1 TABLET EVERY DAY     Cardiovascular: Calcium  Channel Blockers 2 Passed - 01/28/2024  2:34 PM      Passed - Last BP in normal  range    BP Readings from Last 1 Encounters:  12/11/23 135/72         Passed - Last Heart Rate in normal range    Pulse Readings from Last 1 Encounters:  12/11/23 85         Passed - Valid encounter within last 6 months    Recent Outpatient Visits           5 months ago Stage 3a chronic kidney disease (HCC)   [place] Melvin Pao, NP       Future Appointments             In 1 month Melvin Pao, NP [facility], [address]             lisinopril -hydrochlorothiazide  (ZESTORETIC ) 20-25 MG tablet [Pharmacy Med Name: LISINOPRIL /HYDROCHLOROTHIAZIDE  20-25 MG Oral Tablet] 90 tablet 3    Sig: TAKE 1 TABLET EVERY DAY     Cardiovascular:  ACEI + Diuretic Combos Failed - 01/28/2024  2:34 PM      Failed - Cr in normal range and within 180 days    Creatinine, Ser  Date Value Ref Range Status  08/30/2023 1.82 (H) 0.76 - 1.27 mg/dL Final         Passed - Na in normal range and within 180 days    Sodium  Date Value Ref Range Status  08/30/2023 134 134 - 144 mmol/L Final         Passed - K in normal range and within 180 days    Potassium  Date Value Ref Range Status  08/30/2023 4.2 3.5 - 5.2 mmol/L Final         Passed - eGFR is 30 or above and within 180 days    GFR calc Af Amer  Date Value Ref Range Status  10/10/2019 54 (L) >59 mL/min/1.73 Final    Comment:    **[facility] currently reports eGFR in compliance with the current**   recommendations of the [employer]. [facility] will   update reporting as new guidelines are published from the NKF-ASN   Task force.    GFR calc non Af Amer  Date Value Ref Range Status  10/10/2019 46 (L) >59 mL/min/1.73 Final   eGFR  Date Value Ref Range Status  08/30/2023 39 (L) >59 mL/min/1.73 Final         Passed - Patient is not pregnant      Passed - Last BP in normal range    BP Readings from Last 1 Encounters:  12/11/23 135/72         Passed -  Valid encounter within last 6 months    Recent Outpatient Visits           5 months ago Stage 3a chronic kidney disease (HCC)   [facility] Melvin Pao, NP       Future Appointments             In 1 month Melvin Pao, NP  [facility], [address]

## 2024-03-03 ENCOUNTER — Encounter: Payer: Self-pay | Admitting: Nurse Practitioner

## 2024-03-03 ENCOUNTER — Ambulatory Visit: Admitting: Nurse Practitioner

## 2024-03-03 VITALS — BP 113/68 | HR 80 | Temp 97.4°F | Ht 73.5 in | Wt 163.8 lb

## 2024-03-03 DIAGNOSIS — E782 Mixed hyperlipidemia: Secondary | ICD-10-CM

## 2024-03-03 DIAGNOSIS — J432 Centrilobular emphysema: Secondary | ICD-10-CM

## 2024-03-03 DIAGNOSIS — I10 Essential (primary) hypertension: Secondary | ICD-10-CM | POA: Diagnosis not present

## 2024-03-03 DIAGNOSIS — I6523 Occlusion and stenosis of bilateral carotid arteries: Secondary | ICD-10-CM | POA: Diagnosis not present

## 2024-03-03 DIAGNOSIS — N1831 Chronic kidney disease, stage 3a: Secondary | ICD-10-CM | POA: Diagnosis not present

## 2024-03-03 DIAGNOSIS — Z23 Encounter for immunization: Secondary | ICD-10-CM

## 2024-03-03 DIAGNOSIS — Z0000 Encounter for general adult medical examination without abnormal findings: Secondary | ICD-10-CM

## 2024-03-03 MED ORDER — AMLODIPINE BESYLATE 5 MG PO TABS: 5.0000 mg | ORAL_TABLET | Freq: Every day | ORAL | 1 refills | Status: AC

## 2024-03-03 MED ORDER — ATORVASTATIN CALCIUM 20 MG PO TABS: 20.0000 mg | ORAL_TABLET | Freq: Every day | ORAL | 1 refills | Status: AC

## 2024-03-03 MED ORDER — LISINOPRIL-HYDROCHLOROTHIAZIDE 20-25 MG PO TABS: 1.0000 | ORAL_TABLET | Freq: Every day | ORAL | 1 refills | Status: AC

## 2024-03-03 NOTE — Assessment & Plan Note (Signed)
 Chronic.  Controlled.  Continue with current medication regimen.  Continue with ACE for kidney protection.  Labs ordered today.  Return to clinic in 6 months for reevaluation.  Call sooner if concerns arise.

## 2024-03-03 NOTE — Progress Notes (Signed)
 Chief Complaint  Patient presents with   Medicare Wellness     Subjective:   Aaron Boyd is a 73 y.o. male who presents for a Medicare Annual Wellness Visit.  Allergies (verified) Patient has no known allergies.   History: Past Medical History:  Diagnosis Date   CVA (cerebral infarction) 2005   ED (erectile dysfunction)    Hematuria    Hyperlipidemia    Hypertension    Past Surgical History:  Procedure Laterality Date   clavicle surgery  1970   EYE SURGERY Left    left ear trauma     varicose veins     Family History  Problem Relation Age of Onset   Diabetes Mother    Heart disease Father    Hypertension Father    Heart disease Brother    Social History   Occupational History   Occupation: retired  Tobacco Use   Smoking status: Former    Current packs/day: 0.00    Average packs/day: 0.5 packs/day for 44.0 years (22.0 ttl pk-yrs)    Types: Cigarettes    Start date: 01/15/1976    Quit date: 01/15/2020    Years since quitting: 4.1   Smokeless tobacco: Never  Vaping Use   Vaping status: Former   Quit date: 05/04/2017  Substance and Sexual Activity   Alcohol use: Yes    Comment: on occasion   Drug use: No   Sexual activity: Not Currently   Tobacco Counseling Counseling given: Not Answered  SDOH Screenings   Food Insecurity: No Food Insecurity (03/03/2024)  Housing: Low Risk  (03/03/2024)  Transportation Needs: No Transportation Needs (03/03/2024)  Utilities: Not At Risk (03/03/2024)  Alcohol Screen: Low Risk  (01/17/2023)  Depression (PHQ2-9): Low Risk  (03/03/2024)  Financial Resource Strain: Low Risk  (01/17/2023)  Physical Activity: Insufficiently Active (03/03/2024)  Social Connections: Socially Isolated (03/03/2024)  Stress: No Stress Concern Present (01/17/2023)  Tobacco Use: Medium Risk (03/03/2024)  Health Literacy: Adequate Health Literacy (01/17/2023)   See flowsheets for full screening details  Depression Screen PHQ 2 & 9 Depression  Scale- Over the past 2 weeks, how often have you been bothered by any of the following problems? Little interest or pleasure in doing things: 0 Feeling down, depressed, or hopeless (PHQ Adolescent also includes...irritable): 0 PHQ-2 Total Score: 0 Trouble falling or staying asleep, or sleeping too much: 0 Feeling tired or having little energy: 0 Poor appetite or overeating (PHQ Adolescent also includes...weight loss): 0 Feeling bad about yourself - or that you are a failure or have let yourself or your family down: 0 Trouble concentrating on things, such as reading the newspaper or watching television (PHQ Adolescent also includes...like school work): 0 Moving or speaking so slowly that other people could have noticed. Or the opposite - being so fidgety or restless that you have been moving around a lot more than usual: 0 Thoughts that you would be better off dead, or of hurting yourself in some way: 0 PHQ-9 Total Score: 0     Goals Addressed               This Visit's Progress     no goals (pt-stated)         Visit info / Clinical Intake: Medicare Wellness Visit Type:: Subsequent Annual Wellness Visit Persons participating in visit:: patient Medicare Wellness Visit Mode:: In-person (required for WTM) Information given by:: patient Interpreter Needed?: No Pre-visit prep was completed: yes AWV questionnaire completed by patient prior to visit?:  no Living arrangements:: (!) lives alone Patient's Overall Health Status Rating: good Typical amount of pain: some Does pain affect daily life?: no Are you currently prescribed opioids?: no  Dietary Habits and Nutritional Risks How many meals a day?: 2 Eats fruit and vegetables daily?: yes Most meals are obtained by: preparing own meals In the last 2 weeks, have you had any of the following?: none Diabetic:: no  Functional Status Activities of Daily Living (to include ambulation/medication): Independent Ambulation:  Independent Medication Administration: Independent Home Management: Independent Manage your own finances?: yes Primary transportation is: driving; family/friends Concerns about vision?: no *vision screening is required for WTM* (just slight eye itching in right eye) Concerns about hearing?: no  Fall Screening Falls in the past year?: 0 Number of falls in past year: 0 Was there an injury with Fall?: 0 Fall Risk Category Calculator: 0 Patient Fall Risk Level: Low Fall Risk  Fall Risk Patient at Risk for Falls Due to: No Fall Risks Fall risk Follow up: Falls evaluation completed  Home and Transportation Safety: All rugs have non-skid backing?: N/A, no rugs All stairs or steps have railings?: yes (On the back porch) Grab bars in the bathtub or shower?: (!) no Have non-skid surface in bathtub or shower?: yes Good home lighting?: yes Regular seat belt use?: yes Hospital stays in the last year:: no  Cognitive Assessment Difficulty concentrating, remembering, or making decisions? : yes (Some issues with remebering things) Will 6CIT or Mini Cog be Completed: yes What year is it?: 0 points What month is it?: 0 points Give patient an address phrase to remember (5 components): [address] [place] About what time is it?: 0 points Count backwards from 20 to 1: 0 points Say the months of the year in reverse: 0 points Repeat the address phrase from earlier: 2 points 6 CIT Score: 2 points  Advance Directives (For Healthcare) Does Patient Have a Medical Advance Directive?: Yes Does patient want to make changes to medical advance directive?: No - Patient declined Type of Advance Directive: Living will  Reviewed/Updated  Reviewed/Updated: Reviewed All (Medical, Surgical, Family, Medications, Allergies, Care Teams, Patient Goals); Medical History; Surgical History; Family History; Medications; Allergies; Care Teams; Patient Goals        Objective:    Today's Vitals   03/03/24  0939  BP: 113/68  Pulse: 80  Temp: (!) 97.4 F (36.3 C)  TempSrc: Oral  SpO2: 98%  Weight: 163 lb 12.8 oz (74.3 kg)  Height: 6' 1.5 (1.867 m)  PainSc: 0-No pain   Body mass index is 21.32 kg/m.  Current Medications (verified) Outpatient Encounter Medications as of 03/03/2024  Medication Sig   acetaminophen (TYLENOL) 325 MG tablet Take 650 mg by mouth every 6 (six) hours as needed.   amLODipine  (NORVASC ) 5 MG tablet TAKE 1 TABLET EVERY DAY   aspirin EC 81 MG tablet Take 81 mg by mouth daily. Swallow whole.   atorvastatin  (LIPITOR) 20 MG tablet TAKE 1 TABLET EVERY DAY   lisinopril -hydrochlorothiazide  (ZESTORETIC ) 20-25 MG tablet TAKE 1 TABLET EVERY DAY   No facility-administered encounter medications on file as of 03/03/2024.   Hearing/Vision screen No results found. Immunizations and Health Maintenance Health Maintenance  Topic Date Due   Zoster Vaccines- Shingrix (1 of 2) Never done   Lung Cancer Screening  11/05/2019   Influenza Vaccine  11/02/2023   COVID-19 Vaccine (4 - 2025-26 season) 12/03/2023   Medicare Annual Wellness (AWV)  03/03/2025   Fecal DNA (Cologuard)  06/22/2025  DTaP/Tdap/Td (2 - Tdap) 10/09/2029   Pneumococcal Vaccine: 50+ Years  Completed   Hepatitis C Screening  Completed   Meningococcal B Vaccine  Aged Out   Colonoscopy  Discontinued        Assessment/Plan:  This is a routine wellness examination for Theophile.  Patient Care Team: Melvin Pao, NP as PCP - General  I have personally reviewed and noted the following in the patient's chart:   Medical and social history Use of alcohol, tobacco or illicit drugs  Current medications and supplements including opioid prescriptions. Functional ability and status Nutritional status Physical activity Advanced directives List of other physicians Hospitalizations, surgeries, and ER visits in previous 12 months Vitals Screenings to include cognitive, depression, and falls Referrals and  appointments  No orders of the defined types were placed in this encounter.  In addition, I have reviewed and discussed with patient certain preventive protocols, quality metrics, and best practice recommendations. A written personalized care plan for preventive services as well as general preventive health recommendations were provided to patient.   Joya GORMAN Louder, CMA   03/03/2024   No follow-ups on file.  After Visit Summary: (In Person-Printed) AVS printed and given to the patient

## 2024-03-03 NOTE — Assessment & Plan Note (Signed)
 Followed by Vascular.  Last appt was September 2025. No intervention recommended at this time.  Continue with annual follow up.

## 2024-03-03 NOTE — Assessment & Plan Note (Signed)
 Chronic.  Controlled.  Continue with current medication regimen of Amlodipine  5mg , Lisinopril  20mg  and HCTZ 25mg  daily.  Refills sent today.  Recommend checking blood pressures at home and bring log to next visit.   Labs ordered today.  Return to clinic in 6 months for reevaluation.  Call sooner if concerns arise.

## 2024-03-03 NOTE — Assessment & Plan Note (Signed)
 Chronic, ongoing.  Noted on CT imaging.  No current inhalers or symptoms. Does not feel like he needs them at this time.  Walks daily.  Encouraged to continue exercise.  Patient would like to wait until it is warmer to get the CT Lung Screening.  Recommend continued cessation of smoking.  Follow up in 6 months.  Call sooner if concerns arise.

## 2024-03-03 NOTE — Progress Notes (Signed)
 BP 113/68 (BP Location: Right Arm, Patient Position: Sitting, Cuff Size: Normal)   Pulse 80   Temp (!) 97.4 F (36.3 C) (Oral)   Ht 6' 1.5 (1.867 m)   Wt 163 lb 12.8 oz (74.3 kg)   SpO2 98%   BMI 21.32 kg/m    Subjective:    Patient ID: Aaron Boyd, male    DOB: 1950-10-24, 73 y.o.   MRN: [id number]  HPI: Aaron Boyd is a 73 y.o. male presenting on 03/03/2024 for comprehensive medical examination. Current medical complaints include:none  He currently lives with: Interim Problems from his last visit: no  HYPERTENSION / HYPERLIPIDEMIA Doing well with current regimen.  Satisfied with current treatment? yes Duration of hypertension: years BP monitoring frequency: not checking BP range:  BP medication side effects: no Past BP meds: amlodipine  and lisinopril -HCTZ Duration of hyperlipidemia: years Cholesterol medication side effects: no Cholesterol supplements: none Past cholesterol medications: atorvastain (lipitor) Medication compliance: excellent compliance Aspirin: yes Recent stressors: no Recurrent headaches: no Visual changes: no Palpitations: no Dyspnea: no Chest pain: no Lower extremity edema: no Dizzy/lightheaded: no  COPD He hasn't been walking as much lately with the cold.  He wants to wait for the CT lung screening until next year. COPD status: controlled Satisfied with current treatment?: yes Oxygen use: no Dyspnea frequency: walking daily Cough frequency: not very often Rescue inhaler frequency:   Limitation of activity: no Productive cough:  Last Spirometry:  Pneumovax: Up to Date Influenza: Up to Date  CHRONIC KIDNEY DISEASE CKD status: controlled Medications renally dose: yes Previous renal evaluation: no Pneumovax:  Up to Date Influenza Vaccine:  Up to Date  Depression Screen done today and results listed below:     03/03/2024    9:59 AM 08/30/2023    1:13 PM 01/17/2023    2:29 PM 06/01/2022    9:00 AM 11/28/2021    1:47 PM   Depression screen PHQ 2/9  Decreased Interest 0 0 0 0 0  Down, Depressed, Hopeless 0 0 0 0 0  PHQ - 2 Score 0 0 0 0 0  Altered sleeping  0 0 0 0  Tired, decreased energy  0 0 0 1  Change in appetite  0 0 0 0  Feeling bad or failure about yourself   0 0 0 0  Trouble concentrating  0 0 0 0  Moving slowly or fidgety/restless  0 0 0 0  Suicidal thoughts  0 0 0 0  PHQ-9 Score  0  0  0  1   Difficult doing work/chores   Not difficult at all Not difficult at all Not difficult at all     Data saved with a previous flowsheet row definition    The patient has a history of falls. I did complete a risk assessment for falls. A plan of care for falls was documented.   Past Medical History:  Past Medical History:  Diagnosis Date   CVA (cerebral infarction) 2005   ED (erectile dysfunction)    Hematuria    Hyperlipidemia    Hypertension     Surgical History:  Past Surgical History:  Procedure Laterality Date   clavicle surgery  1970   EYE SURGERY Left    left ear trauma     varicose veins      Medications:  Current Outpatient Medications on File Prior to Visit  Medication Sig   acetaminophen (TYLENOL) 325 MG tablet Take 650 mg by mouth every 6 (six) hours  as needed.   aspirin EC 81 MG tablet Take 81 mg by mouth daily. Swallow whole.   No current facility-administered medications on file prior to visit.    Allergies:  No Known Allergies  Social History:  Social History   Socioeconomic History   Marital status: Widowed    Spouse name: Not on file   Number of children: Not on file   Years of education: Not on file   Highest education level: High school graduate  Occupational History   Occupation: retired  Tobacco Use   Smoking status: Former    Current packs/day: 0.00    Average packs/day: 0.5 packs/day for 44.0 years (22.0 ttl pk-yrs)    Types: Cigarettes    Start date: 01/15/1976    Quit date: 01/15/2020    Years since quitting: 4.1   Smokeless tobacco: Never   Vaping Use   Vaping status: Former   Quit date: 05/04/2017  Substance and Sexual Activity   Alcohol use: Yes    Comment: on occasion   Drug use: No   Sexual activity: Not Currently  Other Topics Concern   Not on file  Social History Narrative   Not on file   Social Drivers of Health   Financial Resource Strain: Low Risk  (01/17/2023)   Overall Financial Resource Strain (CARDIA)    Difficulty of Paying Living Expenses: Not hard at all  Food Insecurity: No Food Insecurity (03/03/2024)   Hunger Vital Sign    Worried About Running Out of Food in the Last Year: Never true    Ran Out of Food in the Last Year: Never true  Transportation Needs: No Transportation Needs (03/03/2024)   PRAPARE - Administrator, Civil Service (Medical): No    Lack of Transportation (Non-Medical): No  Physical Activity: Insufficiently Active (03/03/2024)   Exercise Vital Sign    Days of Exercise per Week: 3 days    Minutes of Exercise per Session: 20 min  Stress: No Stress Concern Present (01/17/2023)   [employer] of Occupational Health - Occupational Stress Questionnaire    Feeling of Stress : Not at all  Social Connections: Socially Isolated (03/03/2024)   Social Connection and Isolation Panel    Frequency of Communication with Friends and Family: More than three times a week    Frequency of Social Gatherings with Friends and Family: Once a week    Attends Religious Services: Never    Database Administrator or Organizations: No    Attends Banker Meetings: Never    Marital Status: Widowed  Intimate Partner Violence: Not At Risk (03/03/2024)   Humiliation, Afraid, Rape, and Kick questionnaire    Fear of Current or Ex-Partner: No    Emotionally Abused: No    Physically Abused: No    Sexually Abused: No   Social History   Tobacco Use  Smoking Status Former   Current packs/day: 0.00   Average packs/day: 0.5 packs/day for 44.0 years (22.0 ttl pk-yrs)   Types: Cigarettes    Start date: 01/15/1976   Quit date: 01/15/2020   Years since quitting: 4.1  Smokeless Tobacco Never   Social History   Substance and Sexual Activity  Alcohol Use Yes   Comment: on occasion    Family History:  Family History  Problem Relation Age of Onset   Diabetes Mother    Heart disease Father    Hypertension Father    Heart disease Brother     Past medical history, surgical  history, medications, allergies, family history and social history reviewed with patient today and changes made to appropriate areas of the chart.   Review of Systems  Eyes:  Negative for blurred vision and double vision.  Respiratory:  Negative for shortness of breath.   Cardiovascular:  Negative for chest pain, palpitations and leg swelling.  Neurological:  Negative for dizziness and headaches.   All other ROS negative except what is listed above and in the HPI.      Objective:    BP 113/68 (BP Location: Right Arm, Patient Position: Sitting, Cuff Size: Normal)   Pulse 80   Temp (!) 97.4 F (36.3 C) (Oral)   Ht 6' 1.5 (1.867 m)   Wt 163 lb 12.8 oz (74.3 kg)   SpO2 98%   BMI 21.32 kg/m   Wt Readings from Last 3 Encounters:  03/03/24 163 lb 12.8 oz (74.3 kg)  12/11/23 163 lb (73.9 kg)  08/30/23 161 lb (73 kg)    Physical Exam Vitals and nursing note reviewed.  Constitutional:      General: He is not in acute distress.    Appearance: Normal appearance. He is not ill-appearing, toxic-appearing or diaphoretic.  HENT:     Head: Normocephalic.     Right Ear: Tympanic membrane, ear canal and external ear normal.     Left Ear: Tympanic membrane, ear canal and external ear normal.     Nose: Nose normal. No congestion or rhinorrhea.     Mouth/Throat:     Mouth: Mucous membranes are moist.  Eyes:     General:        Right eye: No discharge.        Left eye: No discharge.     Extraocular Movements: Extraocular movements intact.     Conjunctiva/sclera: Conjunctivae normal.     Pupils:  Pupils are equal, round, and reactive to light.  Cardiovascular:     Rate and Rhythm: Normal rate and regular rhythm.     Heart sounds: No murmur heard. Pulmonary:     Effort: Pulmonary effort is normal. No respiratory distress.     Breath sounds: Normal breath sounds. No wheezing, rhonchi or rales.  Abdominal:     General: Abdomen is flat. Bowel sounds are normal. There is no distension.     Palpations: Abdomen is soft.     Tenderness: There is no abdominal tenderness. There is no guarding.  Musculoskeletal:     Cervical back: Normal range of motion and neck supple.  Skin:    General: Skin is warm and dry.     Capillary Refill: Capillary refill takes less than 2 seconds.  Neurological:     General: No focal deficit present.     Mental Status: He is alert and oriented to person, place, and time.     Cranial Nerves: No cranial nerve deficit.     Motor: No weakness.     Deep Tendon Reflexes: Reflexes normal.  Psychiatric:        Mood and Affect: Mood normal.        Behavior: Behavior normal.        Thought Content: Thought content normal.        Judgment: Judgment normal.     Results for orders placed or performed in visit on 08/30/23  Comp Met (CMET)   Collection Time: 08/30/23  1:25 PM  Result Value Ref Range   Glucose 74 70 - 99 mg/dL   BUN 45 (H) 8 - 27 mg/dL  Creatinine, Ser 1.82 (H) 0.76 - 1.27 mg/dL   eGFR 39 (L) >40 fO/fpw/8.26   BUN/Creatinine Ratio 25 (H) 10 - 24   Sodium 134 134 - 144 mmol/L   Potassium 4.2 3.5 - 5.2 mmol/L   Chloride 99 96 - 106 mmol/L   CO2 19 (L) 20 - 29 mmol/L   Calcium  9.1 8.6 - 10.2 mg/dL   Total Protein 7.1 6.0 - 8.5 g/dL   Albumin 4.0 3.8 - 4.8 g/dL   Globulin, Total 3.1 1.5 - 4.5 g/dL   Bilirubin Total 0.5 0.0 - 1.2 mg/dL   Alkaline Phosphatase 68 44 - 121 IU/L   AST 17 0 - 40 IU/L   ALT 17 0 - 44 IU/L  Lipid Profile   Collection Time: 08/30/23  1:25 PM  Result Value Ref Range   Cholesterol, Total 153 100 - 199 mg/dL    Triglycerides 855 0 - 149 mg/dL   HDL 54 >60 mg/dL   VLDL Cholesterol Cal 25 5 - 40 mg/dL   LDL Chol Calc (NIH) 74 0 - 99 mg/dL   Chol/HDL Ratio 2.8 0.0 - 5.0 ratio      Assessment & Plan:   Problem List Items Addressed This Visit       Cardiovascular and Mediastinum   Bilateral carotid artery disease   Followed by Vascular.  Last appt was September 2025. No intervention recommended at this time.  Continue with annual follow up.      Relevant Medications   amLODipine  (NORVASC ) 5 MG tablet   atorvastatin  (LIPITOR) 20 MG tablet   lisinopril -hydrochlorothiazide  (ZESTORETIC ) 20-25 MG tablet   Essential (primary) hypertension   Chronic.  Controlled.  Continue with current medication regimen of Amlodipine  5mg , Lisinopril  20mg  and HCTZ 25mg  daily.  Refills sent today.  Recommend checking blood pressures at home and bring log to next visit.   Labs ordered today.  Return to clinic in 6 months for reevaluation.  Call sooner if concerns arise.       Relevant Medications   amLODipine  (NORVASC ) 5 MG tablet   atorvastatin  (LIPITOR) 20 MG tablet   lisinopril -hydrochlorothiazide  (ZESTORETIC ) 20-25 MG tablet     Respiratory   Chronic obstructive pulmonary disease (HCC)   Chronic, ongoing.  Noted on CT imaging.  No current inhalers or symptoms. Does not feel like he needs them at this time.  Walks daily.  Encouraged to continue exercise.  Patient would like to wait until it is warmer to get the CT Lung Screening.  Recommend continued cessation of smoking.  Follow up in 6 months.  Call sooner if concerns arise.        Genitourinary   Chronic kidney disease, stage 3 (HCC) (Chronic)   Chronic.  Controlled.  Continue with current medication regimen.  Continue with ACE for kidney protection.  Labs ordered today.  Return to clinic in 6 months for reevaluation.  Call sooner if concerns arise.         Other   Hyperlipidemia   Chronic.  Controlled.  Continue with current medication regimen of  Atorvastatin  20mg  daily.  Denies side effects from medication.  Refills sent today. Labs ordered today.  Return to clinic in 6 months for reevaluation.  Call sooner if concerns arise.       Relevant Medications   amLODipine  (NORVASC ) 5 MG tablet   atorvastatin  (LIPITOR) 20 MG tablet   lisinopril -hydrochlorothiazide  (ZESTORETIC ) 20-25 MG tablet   Other Relevant Orders   Lipid panel   Other Visit Diagnoses  Encounter for Medicare annual wellness exam    -  Primary     Annual physical exam       Health maintenance reviewed during visit today.  Labs orderd.  Vaccines reviewed. Cologuard up to date.   Relevant Orders   TSH   PSA   Lipid panel   CBC with Differential/Platelet   Comprehensive metabolic panel with GFR     Need for influenza vaccination       Relevant Orders   Flu vaccine HIGH DOSE PF(Fluzone Trivalent) (Completed)        Discussed aspirin prophylaxis for myocardial infarction prevention and decision was it was not indicated  LABORATORY TESTING:  Health maintenance labs ordered today as discussed above.   The natural history of prostate cancer and ongoing controversy regarding screening and potential treatment outcomes of prostate cancer has been discussed with the patient. The meaning of a false positive PSA and a false negative PSA has been discussed. He indicates understanding of the limitations of this screening test and wishes to proceed with screening PSA testing.   IMMUNIZATIONS:   - Tdap: Tetanus vaccination status reviewed: last tetanus booster within 10 years. - Influenza: Administered today - Pneumovax: Up to date - Prevnar: Up to date - COVID: Refused - HPV: Not applicable - Shingrix vaccine: Discussed at visit today  SCREENING: - Colonoscopy: Up to date  Discussed with patient purpose of the colonoscopy is to detect colon cancer at curable precancerous or early stages   - AAA Screening: Not applicable  -Hearing Test: Not applicable   -Spirometry: Not applicable   PATIENT COUNSELING:    Sexuality: Discussed sexually transmitted diseases, partner selection, use of condoms, avoidance of unintended pregnancy  and contraceptive alternatives.   Advised to avoid cigarette smoking.  I discussed with the patient that most people either abstain from alcohol or drink within safe limits (<=14/week and <=4 drinks/occasion for males, <=7/weeks and <= 3 drinks/occasion for females) and that the risk for alcohol disorders and other health effects rises proportionally with the number of drinks per week and how often a drinker exceeds daily limits.  Discussed cessation/primary prevention of drug use and availability of treatment for abuse.   Diet: Encouraged to adjust caloric intake to maintain  or achieve ideal body weight, to reduce intake of dietary saturated fat and total fat, to limit sodium intake by avoiding high sodium foods and not adding table salt, and to maintain adequate dietary potassium and calcium  preferably from fresh fruits, vegetables, and low-fat dairy products.    stressed the importance of regular exercise  Injury prevention: Discussed safety belts, safety helmets, smoke detector, smoking near bedding or upholstery.   Dental health: Discussed importance of regular tooth brushing, flossing, and dental visits.   Follow up plan: NEXT PREVENTATIVE PHYSICAL DUE IN 1 YEAR. Return in about 6 months (around 09/01/2024) for HTN, HLD, DM2 FU.

## 2024-03-03 NOTE — Assessment & Plan Note (Signed)
 Chronic.  Controlled.  Continue with current medication regimen of Atorvastatin  20mg  daily.  Denies side effects from medication.  Refills sent today. Labs ordered today.  Return to clinic in 6 months for reevaluation.  Call sooner if concerns arise.

## 2024-03-04 ENCOUNTER — Ambulatory Visit: Payer: Self-pay | Admitting: Nurse Practitioner

## 2024-03-04 DIAGNOSIS — R972 Elevated prostate specific antigen [PSA]: Secondary | ICD-10-CM

## 2024-03-04 LAB — CBC WITH DIFFERENTIAL/PLATELET
Basophils Absolute: 0.1 x10E3/uL (ref 0.0–0.2)
Basos: 1 %
EOS (ABSOLUTE): 0.2 x10E3/uL (ref 0.0–0.4)
Eos: 2 %
Hematocrit: 44.9 % (ref 37.5–51.0)
Hemoglobin: 15 g/dL (ref 13.0–17.7)
Immature Grans (Abs): 0.1 x10E3/uL (ref 0.0–0.1)
Immature Granulocytes: 1 %
Lymphocytes Absolute: 2.3 x10E3/uL (ref 0.7–3.1)
Lymphs: 29 %
MCH: 31 pg (ref 26.6–33.0)
MCHC: 33.4 g/dL (ref 31.5–35.7)
MCV: 93 fL (ref 79–97)
Monocytes Absolute: 0.6 x10E3/uL (ref 0.1–0.9)
Monocytes: 7 %
Neutrophils Absolute: 4.8 x10E3/uL (ref 1.4–7.0)
Neutrophils: 60 %
Platelets: 227 x10E3/uL (ref 150–450)
RBC: 4.84 x10E6/uL (ref 4.14–5.80)
RDW: 12.9 % (ref 11.6–15.4)
WBC: 8 x10E3/uL (ref 3.4–10.8)

## 2024-03-04 LAB — LIPID PANEL
Chol/HDL Ratio: 2.5 ratio (ref 0.0–5.0)
Cholesterol, Total: 156 mg/dL (ref 100–199)
HDL: 63 mg/dL (ref >39)
LDL Chol Calc (NIH): 79 mg/dL (ref 0–99)
Triglycerides: 75 mg/dL (ref 0–149)
VLDL Cholesterol Cal: 14 mg/dL (ref 5–40)

## 2024-03-04 LAB — COMPREHENSIVE METABOLIC PANEL WITH GFR
ALT: 21 IU/L (ref 0–44)
AST: 26 IU/L (ref 0–40)
Albumin: 4.1 g/dL (ref 3.8–4.8)
Alkaline Phosphatase: 76 IU/L (ref 47–123)
BUN/Creatinine Ratio: 24 (ref 10–24)
BUN: 34 mg/dL — ABNORMAL HIGH (ref 8–27)
Bilirubin Total: 0.7 mg/dL (ref 0.0–1.2)
CO2: 23 mmol/L (ref 20–29)
Calcium: 9.3 mg/dL (ref 8.6–10.2)
Chloride: 97 mmol/L (ref 96–106)
Creatinine, Ser: 1.42 mg/dL — ABNORMAL HIGH (ref 0.76–1.27)
Globulin, Total: 3 g/dL (ref 1.5–4.5)
Glucose: 91 mg/dL (ref 70–99)
Potassium: 4.7 mmol/L (ref 3.5–5.2)
Sodium: 135 mmol/L (ref 134–144)
Total Protein: 7.1 g/dL (ref 6.0–8.5)
eGFR: 53 mL/min/1.73 — ABNORMAL LOW (ref >59)

## 2024-03-04 LAB — TSH: TSH: 1.96 u[IU]/mL (ref 0.450–4.500)

## 2024-03-04 LAB — PSA: Prostate Specific Ag, Serum: 8.5 ng/mL — ABNORMAL HIGH (ref 0.0–4.0)

## 2024-09-01 ENCOUNTER — Ambulatory Visit: Admitting: Nurse Practitioner

## 2024-12-09 ENCOUNTER — Ambulatory Visit (INDEPENDENT_AMBULATORY_CARE_PROVIDER_SITE_OTHER): Admitting: Vascular Surgery

## 2024-12-09 ENCOUNTER — Encounter (INDEPENDENT_AMBULATORY_CARE_PROVIDER_SITE_OTHER)
